# Patient Record
Sex: Female | Born: 1960 | Race: Black or African American | Hispanic: No | Marital: Married | Smoking: Former smoker
Health system: Southern US, Community
[De-identification: ages and names within clinical notes are randomized; demographics above are authoritative.]

## PROBLEM LIST (undated history)

## (undated) DIAGNOSIS — K760 Fatty (change of) liver, not elsewhere classified: Secondary | ICD-10-CM

## (undated) DIAGNOSIS — R7303 Prediabetes: Secondary | ICD-10-CM

## (undated) DIAGNOSIS — I10 Essential (primary) hypertension: Secondary | ICD-10-CM

## (undated) DIAGNOSIS — K219 Gastro-esophageal reflux disease without esophagitis: Secondary | ICD-10-CM

## (undated) HISTORY — PX: UTERINE FIBROID SURGERY: SHX826

## (undated) HISTORY — DX: Prediabetes: R73.03

## (undated) HISTORY — PX: UMBILICAL HERNIA REPAIR: SHX196

## (undated) HISTORY — PX: EYE SURGERY: SHX253

---

## 1998-10-23 ENCOUNTER — Other Ambulatory Visit: Admission: RE | Admit: 1998-10-23 | Discharge: 1998-10-23 | Payer: Self-pay | Admitting: Obstetrics

## 1998-11-08 ENCOUNTER — Emergency Department (HOSPITAL_COMMUNITY): Admission: EM | Admit: 1998-11-08 | Discharge: 1998-11-08 | Payer: Self-pay | Admitting: Emergency Medicine

## 1999-05-01 ENCOUNTER — Emergency Department (HOSPITAL_COMMUNITY): Admission: EM | Admit: 1999-05-01 | Discharge: 1999-05-01 | Payer: Self-pay | Admitting: Emergency Medicine

## 1999-05-09 ENCOUNTER — Other Ambulatory Visit: Admission: RE | Admit: 1999-05-09 | Discharge: 1999-05-09 | Payer: Self-pay | Admitting: Obstetrics

## 2000-02-18 ENCOUNTER — Emergency Department (HOSPITAL_COMMUNITY): Admission: EM | Admit: 2000-02-18 | Discharge: 2000-02-19 | Payer: Self-pay | Admitting: Emergency Medicine

## 2000-05-24 ENCOUNTER — Emergency Department (HOSPITAL_COMMUNITY): Admission: EM | Admit: 2000-05-24 | Discharge: 2000-05-24 | Payer: Self-pay | Admitting: Emergency Medicine

## 2000-12-14 ENCOUNTER — Emergency Department (HOSPITAL_COMMUNITY): Admission: EM | Admit: 2000-12-14 | Discharge: 2000-12-14 | Payer: Self-pay | Admitting: Emergency Medicine

## 2001-01-20 ENCOUNTER — Other Ambulatory Visit: Admission: RE | Admit: 2001-01-20 | Discharge: 2001-01-20 | Payer: Self-pay | Admitting: Obstetrics

## 2001-02-02 ENCOUNTER — Encounter: Payer: Self-pay | Admitting: Obstetrics

## 2001-02-02 ENCOUNTER — Ambulatory Visit (HOSPITAL_COMMUNITY): Admission: RE | Admit: 2001-02-02 | Discharge: 2001-02-02 | Payer: Self-pay | Admitting: Obstetrics

## 2001-10-10 ENCOUNTER — Emergency Department (HOSPITAL_COMMUNITY): Admission: EM | Admit: 2001-10-10 | Discharge: 2001-10-10 | Payer: Self-pay | Admitting: Emergency Medicine

## 2002-02-23 ENCOUNTER — Encounter: Payer: Self-pay | Admitting: Obstetrics

## 2002-02-23 ENCOUNTER — Ambulatory Visit (HOSPITAL_COMMUNITY): Admission: RE | Admit: 2002-02-23 | Discharge: 2002-02-23 | Payer: Self-pay | Admitting: Obstetrics

## 2002-06-14 ENCOUNTER — Other Ambulatory Visit: Admission: RE | Admit: 2002-06-14 | Discharge: 2002-06-14 | Payer: Self-pay | Admitting: Family Medicine

## 2002-06-22 ENCOUNTER — Emergency Department (HOSPITAL_COMMUNITY): Admission: EM | Admit: 2002-06-22 | Discharge: 2002-06-22 | Payer: Self-pay | Admitting: Emergency Medicine

## 2002-11-09 ENCOUNTER — Emergency Department (HOSPITAL_COMMUNITY): Admission: EM | Admit: 2002-11-09 | Discharge: 2002-11-09 | Payer: Self-pay | Admitting: Emergency Medicine

## 2003-09-06 ENCOUNTER — Emergency Department (HOSPITAL_COMMUNITY): Admission: EM | Admit: 2003-09-06 | Discharge: 2003-09-06 | Payer: Self-pay | Admitting: *Deleted

## 2004-02-02 ENCOUNTER — Emergency Department (HOSPITAL_COMMUNITY): Admission: EM | Admit: 2004-02-02 | Discharge: 2004-02-03 | Payer: Self-pay | Admitting: *Deleted

## 2004-05-28 ENCOUNTER — Emergency Department (HOSPITAL_COMMUNITY): Admission: EM | Admit: 2004-05-28 | Discharge: 2004-05-28 | Payer: Self-pay | Admitting: Emergency Medicine

## 2005-01-22 ENCOUNTER — Emergency Department (HOSPITAL_COMMUNITY): Admission: EM | Admit: 2005-01-22 | Discharge: 2005-01-22 | Payer: Self-pay | Admitting: Emergency Medicine

## 2005-11-29 ENCOUNTER — Emergency Department (HOSPITAL_COMMUNITY): Admission: EM | Admit: 2005-11-29 | Discharge: 2005-11-29 | Payer: Self-pay | Admitting: Emergency Medicine

## 2007-02-13 ENCOUNTER — Emergency Department (HOSPITAL_COMMUNITY): Admission: EM | Admit: 2007-02-13 | Discharge: 2007-02-13 | Payer: Self-pay | Admitting: *Deleted

## 2007-09-21 ENCOUNTER — Emergency Department (HOSPITAL_COMMUNITY): Admission: EM | Admit: 2007-09-21 | Discharge: 2007-09-21 | Payer: Self-pay | Admitting: Emergency Medicine

## 2008-02-05 ENCOUNTER — Emergency Department (HOSPITAL_COMMUNITY): Admission: EM | Admit: 2008-02-05 | Discharge: 2008-02-05 | Payer: Self-pay | Admitting: Emergency Medicine

## 2008-11-07 ENCOUNTER — Ambulatory Visit (HOSPITAL_COMMUNITY): Admission: RE | Admit: 2008-11-07 | Discharge: 2008-11-07 | Payer: Self-pay | Admitting: Obstetrics

## 2008-11-11 ENCOUNTER — Encounter: Admission: RE | Admit: 2008-11-11 | Discharge: 2008-11-11 | Payer: Self-pay | Admitting: Obstetrics

## 2008-11-17 ENCOUNTER — Ambulatory Visit (HOSPITAL_COMMUNITY): Admission: RE | Admit: 2008-11-17 | Discharge: 2008-11-17 | Payer: Self-pay | Admitting: Obstetrics

## 2008-11-21 ENCOUNTER — Emergency Department (HOSPITAL_COMMUNITY): Admission: EM | Admit: 2008-11-21 | Discharge: 2008-11-22 | Payer: Self-pay | Admitting: Emergency Medicine

## 2008-12-07 ENCOUNTER — Encounter: Admission: RE | Admit: 2008-12-07 | Discharge: 2008-12-07 | Payer: Self-pay | Admitting: Obstetrics

## 2008-12-11 ENCOUNTER — Encounter: Admission: RE | Admit: 2008-12-11 | Discharge: 2008-12-11 | Payer: Self-pay | Admitting: Obstetrics

## 2009-02-09 ENCOUNTER — Emergency Department (HOSPITAL_COMMUNITY): Admission: EM | Admit: 2009-02-09 | Discharge: 2009-02-10 | Payer: Self-pay | Admitting: Emergency Medicine

## 2009-03-02 ENCOUNTER — Ambulatory Visit (HOSPITAL_COMMUNITY): Admission: RE | Admit: 2009-03-02 | Discharge: 2009-03-02 | Payer: Self-pay | Admitting: Diagnostic Radiology

## 2009-03-28 ENCOUNTER — Ambulatory Visit (HOSPITAL_COMMUNITY): Admission: RE | Admit: 2009-03-28 | Discharge: 2009-03-29 | Payer: Self-pay | Admitting: Diagnostic Radiology

## 2009-04-11 ENCOUNTER — Encounter: Admission: RE | Admit: 2009-04-11 | Discharge: 2009-04-11 | Payer: Self-pay | Admitting: Interventional Radiology

## 2009-07-03 ENCOUNTER — Encounter (INDEPENDENT_AMBULATORY_CARE_PROVIDER_SITE_OTHER): Payer: Self-pay | Admitting: Emergency Medicine

## 2009-07-03 ENCOUNTER — Ambulatory Visit: Payer: Self-pay | Admitting: Vascular Surgery

## 2009-07-03 ENCOUNTER — Ambulatory Visit (HOSPITAL_COMMUNITY): Admission: RE | Admit: 2009-07-03 | Discharge: 2009-07-03 | Payer: Self-pay | Admitting: Emergency Medicine

## 2009-07-03 ENCOUNTER — Emergency Department (HOSPITAL_COMMUNITY): Admission: EM | Admit: 2009-07-03 | Discharge: 2009-07-03 | Payer: Self-pay | Admitting: Emergency Medicine

## 2009-08-29 ENCOUNTER — Emergency Department (HOSPITAL_COMMUNITY): Admission: EM | Admit: 2009-08-29 | Discharge: 2009-08-30 | Payer: Self-pay | Admitting: Emergency Medicine

## 2009-12-04 ENCOUNTER — Ambulatory Visit: Payer: Self-pay | Admitting: Surgery

## 2010-03-07 ENCOUNTER — Emergency Department (HOSPITAL_COMMUNITY): Admission: EM | Admit: 2010-03-07 | Discharge: 2010-03-07 | Payer: Self-pay | Admitting: Emergency Medicine

## 2010-10-06 ENCOUNTER — Encounter: Payer: Self-pay | Admitting: Internal Medicine

## 2010-10-07 ENCOUNTER — Encounter: Payer: Self-pay | Admitting: Obstetrics

## 2010-10-26 ENCOUNTER — Other Ambulatory Visit: Payer: Self-pay | Admitting: Obstetrics

## 2010-10-26 DIAGNOSIS — Z1231 Encounter for screening mammogram for malignant neoplasm of breast: Secondary | ICD-10-CM

## 2010-10-30 ENCOUNTER — Ambulatory Visit: Payer: Self-pay

## 2010-12-02 LAB — POCT CARDIAC MARKERS
CKMB, poc: 1.1 ng/mL (ref 1.0–8.0)
Myoglobin, poc: 66.3 ng/mL (ref 12–200)

## 2010-12-02 LAB — COMPREHENSIVE METABOLIC PANEL
AST: 30 U/L (ref 0–37)
Albumin: 3.7 g/dL (ref 3.5–5.2)
Alkaline Phosphatase: 71 U/L (ref 39–117)
CO2: 26 mEq/L (ref 19–32)
Calcium: 9.2 mg/dL (ref 8.4–10.5)
Creatinine, Ser: 0.68 mg/dL (ref 0.4–1.2)
GFR calc non Af Amer: >60 mL/min (ref >60)
Potassium: 3.6 mEq/L (ref 3.5–5.1)
Sodium: 136 mEq/L (ref 135–145)
Total Bilirubin: 0.5 mg/dL (ref 0.3–1.2)
Total Protein: 7.3 g/dL (ref 6.0–8.3)

## 2010-12-02 LAB — URINALYSIS, ROUTINE W REFLEX MICROSCOPIC
Ketones, ur: NEGATIVE mg/dL
Protein, ur: NEGATIVE mg/dL
pH: 7 (ref 5.0–8.0)

## 2010-12-02 LAB — DIFFERENTIAL
Basophils Absolute: 0 10*3/uL (ref 0.0–0.1)
Basophils Relative: 0 % (ref 0–1)
Lymphs Abs: 2.6 10*3/uL (ref 0.7–4.0)

## 2010-12-02 LAB — URINE CULTURE

## 2010-12-02 LAB — CBC
Hemoglobin: 12.4 g/dL (ref 12.0–15.0)
WBC: 9.6 10*3/uL (ref 4.0–10.5)

## 2010-12-07 ENCOUNTER — Emergency Department (HOSPITAL_COMMUNITY)
Admission: EM | Admit: 2010-12-07 | Discharge: 2010-12-08 | Discharge status: Against Medical Advice | Payer: BC Managed Care – PPO | Attending: Emergency Medicine | Admitting: Emergency Medicine

## 2010-12-08 ENCOUNTER — Other Ambulatory Visit (HOSPITAL_COMMUNITY): Payer: Self-pay | Admitting: Emergency Medicine

## 2010-12-08 ENCOUNTER — Other Ambulatory Visit: Payer: Self-pay | Admitting: Emergency Medicine

## 2010-12-08 ENCOUNTER — Emergency Department (HOSPITAL_COMMUNITY)
Admission: EM | Admit: 2010-12-08 | Discharge: 2010-12-08 | Disposition: A | Discharge status: Home / Self Care | Payer: BC Managed Care – PPO | Attending: Emergency Medicine | Admitting: Emergency Medicine

## 2010-12-08 ENCOUNTER — Ambulatory Visit (HOSPITAL_COMMUNITY)
Admission: RE | Admit: 2010-12-08 | Discharge: 2010-12-08 | Disposition: A | Discharge status: Home / Self Care | Payer: BC Managed Care – PPO | Source: Ambulatory Visit | Attending: Emergency Medicine | Admitting: Emergency Medicine

## 2010-12-08 DIAGNOSIS — R51 Headache: Secondary | ICD-10-CM | POA: Insufficient documentation

## 2010-12-08 DIAGNOSIS — R200 Anesthesia of skin: Secondary | ICD-10-CM

## 2010-12-08 DIAGNOSIS — R202 Paresthesia of skin: Secondary | ICD-10-CM

## 2010-12-08 DIAGNOSIS — I10 Essential (primary) hypertension: Secondary | ICD-10-CM | POA: Insufficient documentation

## 2010-12-08 DIAGNOSIS — R209 Unspecified disturbances of skin sensation: Secondary | ICD-10-CM | POA: Insufficient documentation

## 2010-12-13 LAB — BASIC METABOLIC PANEL
BUN: 13 mg/dL (ref 6–23)
CO2: 28 mEq/L (ref 19–32)
Chloride: 104 mEq/L (ref 96–112)
GFR calc non Af Amer: >60 mL/min (ref >60)
Glucose, Bld: 113 mg/dL — ABNORMAL HIGH (ref 70–99)
Potassium: 3.5 mEq/L (ref 3.5–5.1)
Sodium: 137 mEq/L (ref 135–145)

## 2010-12-17 ENCOUNTER — Other Ambulatory Visit: Payer: Self-pay | Admitting: Obstetrics

## 2010-12-23 LAB — CBC
HCT: 35.5 % — ABNORMAL LOW (ref 36.0–46.0)
Hemoglobin: 11.9 g/dL — ABNORMAL LOW (ref 12.0–15.0)
MCHC: 33.5 g/dL (ref 30.0–36.0)
MCV: 90.3 fL (ref 78.0–100.0)
Platelets: 282 10*3/uL (ref 150–400)
RDW: 14.7 % (ref 11.5–15.5)

## 2010-12-23 LAB — CREATININE, SERUM: GFR calc non Af Amer: >60 mL/min (ref >60)

## 2010-12-24 LAB — CBC
HCT: 38.2 % (ref 36.0–46.0)
Hemoglobin: 12.7 g/dL (ref 12.0–15.0)
MCHC: 33.3 g/dL (ref 30.0–36.0)
RBC: 4.28 MIL/uL (ref 3.87–5.11)
RDW: 14.4 % (ref 11.5–15.5)

## 2010-12-24 LAB — URINE MICROSCOPIC-ADD ON

## 2010-12-24 LAB — URINALYSIS, ROUTINE W REFLEX MICROSCOPIC
Bilirubin Urine: NEGATIVE
Hgb urine dipstick: NEGATIVE
Ketones, ur: NEGATIVE mg/dL
Nitrite: NEGATIVE
pH: 5.5 (ref 5.0–8.0)

## 2010-12-24 LAB — CREATININE, SERUM
Creatinine, Ser: 0.68 mg/dL (ref 0.4–1.2)
GFR calc non Af Amer: >60 mL/min (ref >60)

## 2010-12-24 LAB — HCG, SERUM, QUALITATIVE: Preg, Serum: NEGATIVE

## 2010-12-24 LAB — URINE CULTURE: Colony Count: >=100000

## 2010-12-27 LAB — CBC
HCT: 29.8 % — ABNORMAL LOW (ref 36.0–46.0)
MCHC: 31 g/dL (ref 30.0–36.0)
MCV: 72.7 fL — ABNORMAL LOW (ref 78.0–100.0)
Platelets: 380 10*3/uL (ref 150–400)
RDW: 29.6 % — ABNORMAL HIGH (ref 11.5–15.5)

## 2010-12-27 LAB — DIFFERENTIAL
Basophils Relative: 0 % (ref 0–1)
Eosinophils Absolute: 0.2 10*3/uL (ref 0.0–0.7)
Eosinophils Relative: 2 % (ref 0–5)
Lymphs Abs: 2.8 10*3/uL (ref 0.7–4.0)
Monocytes Absolute: 0.8 10*3/uL (ref 0.1–1.0)
Neutro Abs: 5.4 10*3/uL (ref 1.7–7.7)

## 2010-12-27 LAB — POCT I-STAT, CHEM 8
Calcium, Ion: 1.19 mmol/L (ref 1.12–1.32)
Creatinine, Ser: 0.8 mg/dL (ref 0.4–1.2)
Glucose, Bld: 106 mg/dL — ABNORMAL HIGH (ref 70–99)
HCT: 33 % — ABNORMAL LOW (ref 36.0–46.0)
Hemoglobin: 11.2 g/dL — ABNORMAL LOW (ref 12.0–15.0)

## 2011-01-02 ENCOUNTER — Other Ambulatory Visit: Payer: Self-pay | Admitting: Obstetrics

## 2011-01-02 DIAGNOSIS — N6315 Unspecified lump in the right breast, overlapping quadrants: Secondary | ICD-10-CM

## 2011-01-04 ENCOUNTER — Ambulatory Visit
Admission: RE | Admit: 2011-01-04 | Discharge: 2011-01-04 | Disposition: A | Discharge status: Home / Self Care | Payer: BC Managed Care – PPO | Source: Ambulatory Visit | Attending: Obstetrics | Admitting: Obstetrics

## 2011-01-04 ENCOUNTER — Other Ambulatory Visit: Payer: Self-pay | Admitting: Obstetrics

## 2011-01-04 DIAGNOSIS — N6315 Unspecified lump in the right breast, overlapping quadrants: Secondary | ICD-10-CM

## 2011-01-29 NOTE — Assessment & Plan Note (Signed)
OFFICE VISIT   Marie Tate, Marie Tate  DOB:  1961-03-19                                       12/04/2009  NUUVO#:[id number]   REASON FOR VISIT:  Left leg pain.   HISTORY:  This is a 50 year old female that I am seeing for evaluation  of left leg pain.  The patient states that her pain developed last year  following a uterine artery embolization.  She states that it used to  bother her and keep her awake every night.  The severity has decreased  over the last several months.  She says that she describes it as a  severe cramping.  She has taken up eating bananas for potassium  supplementation, and she started an exercise program now, going 35 to 45  minutes on an exercise bike.  Overall, she states it is somewhat better.   The patient's uterine artery embolization was done through a right groin  access.  There were no complications.  The patient has a history of  undergoing open fibroid removal as well as umbilical hernia repair.   PAST MEDICAL HISTORY:  Hypertension.   FAMILY HISTORY:  Negative for cardiovascular at an early age.   SOCIAL HISTORY:  She has 2 children.  Does not smoke, quit in 2009.  Drinks alcohol twice a month.   REVIEW OF SYSTEMS:  All review of systems are negative except as  documented in the HPI.   ALLERGIES:  None.   MEDICATIONS:  Hydrochlorothiazide, vitamin D, and multivitamin.   PHYSICAL EXAMINATION:  Heart rate 75, blood pressure 148/85, temperature  is 97.9.  General:  She is well-appearing in no distress.  HEENT:  Within normal limits.  Respirations nonlabored.  Cardiovascular:  Regular rate and rhythm.  Extremities:  She has palpable posterior  tibial pulses.  Neuro:  She has no focal weaknesses or sensory deficits.  Her calves are soft bilaterally.  The area is not tender to palpation.  There is no edema.   DIAGNOSTIC STUDIES:  Ankle brachial indices performed today which were  greater than 1 bilaterally with triphasic  signals.   ASSESSMENT:  Left leg pain.   PLAN:  The etiology of the patient's complaints are unclear to me at  this time.  What I have reassured her is that it is not due to arterial  compromise.  She has normal ankle brachial indices bilaterally and  palpable posterior tibial pulses.  She has also undergone ultrasound in  the recent past during the height of her complaints, and this was  negative for deep or superficial vein thrombosis.  I suspect that this  may be somewhat of a neurapraxia.  It is improving, and I told her I  would continue to monitor this to see if it resolves completely.  If she  deteriorates or wants to probe deeper into the etiology of this, I think  the next step would be an MRI; however, I am not recommending doing this  at this time.     Jorge Ny, MD  Electronically Signed   VWB/MEDQ  D:  12/04/2009  T:  12/05/2009  Job:  [id number]   cc:   Kathreen Cosier, M.D.

## 2011-06-12 LAB — GC/CHLAMYDIA PROBE AMP, GENITAL: Chlamydia, DNA Probe: NEGATIVE

## 2011-10-27 ENCOUNTER — Emergency Department (HOSPITAL_COMMUNITY)
Admission: EM | Admit: 2011-10-27 | Discharge: 2011-10-27 | Disposition: A | Discharge status: Home / Self Care | Payer: BC Managed Care – PPO | Attending: Emergency Medicine | Admitting: Emergency Medicine

## 2011-10-27 ENCOUNTER — Other Ambulatory Visit: Payer: Self-pay

## 2011-10-27 ENCOUNTER — Encounter (HOSPITAL_COMMUNITY): Payer: Self-pay | Admitting: Adult Health

## 2011-10-27 DIAGNOSIS — E669 Obesity, unspecified: Secondary | ICD-10-CM | POA: Insufficient documentation

## 2011-10-27 DIAGNOSIS — R0789 Other chest pain: Secondary | ICD-10-CM

## 2011-10-27 DIAGNOSIS — I10 Essential (primary) hypertension: Secondary | ICD-10-CM | POA: Insufficient documentation

## 2011-10-27 HISTORY — DX: Essential (primary) hypertension: I10

## 2011-10-27 LAB — BASIC METABOLIC PANEL
BUN: 16 mg/dL (ref 6–23)
Creatinine, Ser: 0.78 mg/dL (ref 0.50–1.10)
GFR calc Af Amer: >90 mL/min (ref >90)
GFR calc non Af Amer: >90 mL/min (ref >90)
Potassium: 3.8 mEq/L (ref 3.5–5.1)

## 2011-10-27 LAB — CBC
HCT: 34.2 % — ABNORMAL LOW (ref 36.0–46.0)
MCHC: 34.2 g/dL (ref 30.0–36.0)
Platelets: 312 10*3/uL (ref 150–400)
RDW: 14.1 % (ref 11.5–15.5)

## 2011-10-27 LAB — POCT I-STAT TROPONIN I
Troponin i, poc: 0 ng/mL (ref 0.00–0.08)
Troponin i, poc: 0 ng/mL (ref 0.00–0.08)

## 2011-10-27 MED ORDER — GI COCKTAIL ~~LOC~~
30.0000 mL | Freq: Once | ORAL | Status: AC
  Administered 2011-10-27: 30 mL via ORAL
  Filled 2011-10-27: qty 30

## 2011-10-27 MED ORDER — ASPIRIN 325 MG PO TABS: 325.0000 mg | ORAL_TABLET | ORAL | Status: DC

## 2011-10-27 MED ORDER — NITROGLYCERIN 0.4 MG SL SUBL
0.4000 mg | SUBLINGUAL_TABLET | SUBLINGUAL | Status: DC | PRN
  Administered 2011-10-27 (×2): 0.4 mg via SUBLINGUAL
  Filled 2011-10-27: qty 25

## 2011-10-27 NOTE — ED Notes (Signed)
Patient given diet ginger ale per her request. Pt readying for discharge

## 2011-10-27 NOTE — ED Provider Notes (Signed)
History     CSN: [id number]  Arrival date & time 10/27/11  0865   First MD Initiated Contact with Patient 10/27/11 [phone]      Chief Complaint  Patient presents with  . Chest Pain    (Consider location/radiation/quality/duration/timing/severity/associated sxs/prior treatment) HPI Complains of anterior chest pain nonradiating described as a tightness and indigestion originates at epigastrium radiates to the substernal area onset yesterday morning after eating lasted one hour improved after treating herself with TUMS and Pepto-Bismol. Pain is worse with lying supine improved with sitting up and improved with walking. Had repeat episode last night 10 PM shortly after eating dinner, and again this morning. This morning she felt as if she were having a panic attack. Patient treated with 2 sublingual nitroglycerin prior to my examining her which took pain from an 8 to a 7. No other associated symptoms. Cardiac risk factors hypertension, remote family hx; otherwise negative Past Medical History  Diagnosis Date  . Hypertension     History reviewed. No pertinent past surgical history. Surgical history myomectomy History reviewed. No pertinent family history.  History  Substance Use Topics  . Smoking status: Never Smoker   . Smokeless tobacco: Not on file  . Alcohol Use: No   family history: Mother with mi age 29  OB History    Grav Para Term Preterm Abortions TAB SAB Ect Mult Living                  Review of Systems  Respiratory: Positive for chest tightness.   Cardiovascular: Positive for chest pain.  All other systems reviewed and are negative.    Allergies  Review of patient's allergies indicates no known allergies.  Home Medications  No current outpatient prescriptions on file.  BP 152/87  Pulse 91  Temp 98 F (36.7 C)  Resp 16  Wt 200 lb (90.719 kg)  SpO2 99%  Physical Exam  Constitutional: She appears well-developed and well-nourished.  HENT:  Head:  Normocephalic and atraumatic.  Eyes: Conjunctivae are normal. Pupils are equal, round, and reactive to light.  Neck: Neck supple. No tracheal deviation present. No thyromegaly present.  Cardiovascular: Normal rate and regular rhythm.   No murmur heard. Pulmonary/Chest: Effort normal and breath sounds normal.  Abdominal: Soft. Bowel sounds are normal. She exhibits no distension. There is no tenderness.       Obese  Musculoskeletal: Normal range of motion. She exhibits no edema and no tenderness.  Neurological: She is alert. Coordination normal.  Skin: Skin is warm and dry. No rash noted.  Psychiatric: She has a normal mood and affect.    Date: 10/27/2011  Rate: 85  Rhythm: normal sinus rhythm  QRS Axis: normal  Intervals: normal  ST/T Wave abnormalities: normal and nonspecific T wave changes  Conduction Disutrbances:none  Narrative Interpretation:   Old EKG Reviewed: unchanged Unchanged from 10/07/2009  ED Course  Procedures (including critical care time) 9:25 a.m. feels improved after treatment with GI cocktail 12:45 PM resting comfortably Spoke with Dr.Nishan, who will arrange for outpatient followup and outpatient cardiac workup  Labs Reviewed  CBC  BASIC METABOLIC PANEL   No results found.   No diagnosis found.    MDM  Strongly doubt acute coronary syndrome given atypical symptoms normal EKG 2 sets of normal markers only risk factor being hypertension in this menstruating female Diagnoses atypical chest pain        Doug Sou, MD 10/27/11 1255

## 2011-10-27 NOTE — ED Notes (Signed)
Chest pain and tightness described as burning and indigestion that started yesterday, no relief with tums, pain radiates from abdomen into sternum, associated with SOB.

## 2011-10-27 NOTE — ED Notes (Signed)
Pt reports history of GI upset and acid reflux. Pt reports she was suppose to see GI specialist last year for similar issues, but her insurance did not cover and she was unable to go. Pt reports she currently feels better and has no chest discomfort, but feels bloated. Lab at bedside to draw blood

## 2011-10-27 NOTE — ED Notes (Signed)
MD (Dr. Rennis Chris) at bedside, instructed this nurse to stop SL NTG and to remove O2 via nasal cannula which was done, is planning to order GI Cocktail, will monitor.

## 2011-10-27 NOTE — ED Notes (Signed)
MD in briefly to see pt.

## 2011-10-28 ENCOUNTER — Telehealth: Payer: Self-pay | Admitting: Cardiovascular Disease

## 2011-10-28 NOTE — Telephone Encounter (Signed)
Fu call °Patient returning your call °

## 2011-10-28 NOTE — Telephone Encounter (Signed)
PT AWARE NEEDS GXT PER DR NISHAN. GXT  SCHEDULED WITH  SCOTT WEAVER [facility]  FOR 11-06-10 AT 11:30 .Marie Tate

## 2011-10-28 NOTE — Progress Notes (Signed)
LMTCB ./CY 

## 2011-11-07 ENCOUNTER — Encounter: Payer: BC Managed Care – PPO | Admitting: Physician Assistant

## 2012-01-20 ENCOUNTER — Other Ambulatory Visit: Payer: Self-pay | Admitting: Obstetrics

## 2012-01-20 DIAGNOSIS — Z1231 Encounter for screening mammogram for malignant neoplasm of breast: Secondary | ICD-10-CM

## 2012-02-05 ENCOUNTER — Emergency Department (HOSPITAL_COMMUNITY)
Admission: EM | Admit: 2012-02-05 | Discharge: 2012-02-06 | Disposition: A | Discharge status: Home / Self Care | Payer: BC Managed Care – PPO | Attending: Emergency Medicine | Admitting: Emergency Medicine

## 2012-02-05 ENCOUNTER — Ambulatory Visit: Payer: BC Managed Care – PPO

## 2012-02-05 ENCOUNTER — Emergency Department (HOSPITAL_COMMUNITY): Payer: BC Managed Care – PPO

## 2012-02-05 ENCOUNTER — Encounter (HOSPITAL_COMMUNITY): Payer: Self-pay | Admitting: Emergency Medicine

## 2012-02-05 DIAGNOSIS — M549 Dorsalgia, unspecified: Secondary | ICD-10-CM | POA: Insufficient documentation

## 2012-02-05 DIAGNOSIS — M542 Cervicalgia: Secondary | ICD-10-CM | POA: Insufficient documentation

## 2012-02-05 DIAGNOSIS — R079 Chest pain, unspecified: Secondary | ICD-10-CM | POA: Insufficient documentation

## 2012-02-05 LAB — CBC
HCT: 38.5 % (ref 36.0–46.0)
Hemoglobin: 12.9 g/dL (ref 12.0–15.0)
MCH: 29 pg (ref 26.0–34.0)
MCHC: 33.5 g/dL (ref 30.0–36.0)

## 2012-02-05 LAB — COMPREHENSIVE METABOLIC PANEL
BUN: 22 mg/dL (ref 6–23)
CO2: 25 mEq/L (ref 19–32)
Calcium: 9.2 mg/dL (ref 8.4–10.5)
Creatinine, Ser: 0.76 mg/dL (ref 0.50–1.10)
GFR calc Af Amer: >90 mL/min (ref >90)
GFR calc non Af Amer: >90 mL/min (ref >90)
Glucose, Bld: 102 mg/dL — ABNORMAL HIGH (ref 70–99)

## 2012-02-05 LAB — POCT I-STAT TROPONIN I

## 2012-02-05 MED ORDER — KETOROLAC TROMETHAMINE 30 MG/ML IJ SOLN
30.0000 mg | Freq: Once | INTRAMUSCULAR | Status: AC
  Administered 2012-02-06: 30 mg via INTRAVENOUS
  Filled 2012-02-05: qty 1

## 2012-02-05 NOTE — ED Notes (Signed)
Patient transported to X-ray 

## 2012-02-05 NOTE — ED Notes (Signed)
Pt presents tonight with c/o chest pain that started about 1400 today  Pt states she took 2 aspirin and laid down and the pain went away   Pt states about 1600 the pain returned so she took 2 more aspirin  Pt states she also has pain in her back  Pt states her whole back is aching  Pt states she has had dizziness today

## 2012-02-06 ENCOUNTER — Emergency Department (HOSPITAL_COMMUNITY): Payer: BC Managed Care – PPO

## 2012-02-06 LAB — DIFFERENTIAL
Basophils Relative: 0 % (ref 0–1)
Eosinophils Absolute: 0.1 10*3/uL (ref 0.0–0.7)
Monocytes Absolute: 0.8 10*3/uL (ref 0.1–1.0)
Neutro Abs: 8.6 10*3/uL — ABNORMAL HIGH (ref 1.7–7.7)

## 2012-02-06 MED ORDER — IBUPROFEN 600 MG PO TABS: 600.0000 mg | ORAL_TABLET | Freq: Four times a day (QID) | ORAL | Status: AC | PRN

## 2012-02-06 MED ORDER — IOHEXOL 300 MG/ML  SOLN
100.0000 mL | Freq: Once | INTRAMUSCULAR | Status: AC | PRN
  Administered 2012-02-06: 100 mL via INTRAVENOUS

## 2012-02-06 NOTE — Discharge Instructions (Signed)
Chest Pain (Nonspecific) It is often hard to give a specific diagnosis for the cause of chest pain. There is always a chance that your pain could be related to something serious, such as a heart attack or a blood clot in the lungs. You need to follow up with your caregiver for further evaluation. CAUSES   Heartburn.   Pneumonia or bronchitis.   Anxiety or stress.   Inflammation around your heart (pericarditis) or lung (pleuritis or pleurisy).   A blood clot in the lung.   A collapsed lung (pneumothorax). It can develop suddenly on its own (spontaneous pneumothorax) or from injury (trauma) to the chest.   Shingles infection (herpes zoster virus).  The chest wall is composed of bones, muscles, and cartilage. Any of these can be the source of the pain.  The bones can be bruised by injury.   The muscles or cartilage can be strained by coughing or overwork.   The cartilage can be affected by inflammation and become sore (costochondritis).  DIAGNOSIS  Lab tests or other studies, such as X-rays, electrocardiography, stress testing, or cardiac imaging, may be needed to find the cause of your pain.  TREATMENT   Treatment depends on what may be causing your chest pain. Treatment may include:   Acid blockers for heartburn.   Anti-inflammatory medicine.   Pain medicine for inflammatory conditions.   Antibiotics if an infection is present.   You may be advised to change lifestyle habits. This includes stopping smoking and avoiding alcohol, caffeine, and chocolate.   You may be advised to keep your head raised (elevated) when sleeping. This reduces the chance of acid going backward from your stomach into your esophagus.   Most of the time, nonspecific chest pain will improve within 2 to 3 days with rest and mild pain medicine.  HOME CARE INSTRUCTIONS   If antibiotics were prescribed, take your antibiotics as directed. Finish them even if you start to feel better.   For the next few  days, avoid physical activities that bring on chest pain. Continue physical activities as directed.   Do not smoke.   Avoid drinking alcohol.   Only take over-the-counter or prescription medicine for pain, discomfort, or fever as directed by your caregiver.   Follow your caregiver's suggestions for further testing if your chest pain does not go away.   Keep any follow-up appointments you made. If you do not go to an appointment, you could develop lasting (chronic) problems with pain. If there is any problem keeping an appointment, you must call to reschedule.  SEEK MEDICAL CARE IF:   You think you are having problems from the medicine you are taking. Read your medicine instructions carefully.   Your chest pain does not go away, even after treatment.   You develop a rash with blisters on your chest.  SEEK IMMEDIATE MEDICAL CARE IF:   You have increased chest pain or pain that spreads to your arm, neck, jaw, back, or abdomen.   You develop shortness of breath, an increasing cough, or you are coughing up blood.   You have severe back or abdominal pain, feel nauseous, or vomit.   You develop severe weakness, fainting, or chills.   You have a fever.  THIS IS AN EMERGENCY. Do not wait to see if the pain will go away. Get medical help at once. Call your local emergency services (911 in [place] Do not drive yourself to the hospital. MAKE SURE YOU:   Understand these instructions.     Will watch your condition.   Will get help right away if you are not doing well or get worse.   

## 2012-02-06 NOTE — ED Provider Notes (Signed)
History     CSN: [id number]  Arrival date & time 02/05/12  2056   First MD Initiated Contact with Patient 02/05/12 2300      Chief Complaint  Patient presents with  . Chest Pain    (Consider location/radiation/quality/duration/timing/severity/associated sxs/prior treatment) HPI History provided by patient. Has been having neck pain for the last few days, felt like she slept on it wrong. She has since developed left-sided back discomfort worse with movement and now associated left-sided chest discomfort. Just began bothering her around 2 PM today, it is also worse with movement. Aching in quality and chest pain is not radiating. No cough or shortness of breath. No leg pain or swelling. No history of DVT or PE. No fevers. Moderate in severity. Has history of hypertension but no known heart problems otherwise. Past Medical History  Diagnosis Date  . Hypertension     Past Surgical History  Procedure Date  . Uterine fibroid surgery     Family History  Problem Relation Age of Onset  . Heart attack Mother   . Stroke Father     History  Substance Use Topics  . Smoking status: Never Smoker   . Smokeless tobacco: Not on file  . Alcohol Use: Yes     occ    OB History    Grav Para Term Preterm Abortions TAB SAB Ect Mult Living                  Review of Systems  Constitutional: Negative for fever and chills.  HENT: Positive for neck pain. Negative for neck stiffness.   Eyes: Negative for pain.  Respiratory: Negative for shortness of breath.   Cardiovascular: Positive for chest pain.  Gastrointestinal: Negative for abdominal pain.  Genitourinary: Negative for dysuria.  Musculoskeletal: Positive for back pain.  Skin: Negative for rash.  Neurological: Negative for headaches.  All other systems reviewed and are negative.    Allergies  Review of patient's allergies indicates no known allergies.  Home Medications   Current Outpatient Rx  Name Route Sig Dispense  Refill  . ASPIRIN 325 MG PO TABS Oral Take 325 mg by mouth every 4 (four) hours as needed. For pain relief    . OMEGA-3 FATTY ACIDS 1000 MG PO CAPS Oral Take 2 g by mouth daily.    Marland Kitchen LISINOPRIL-HYDROCHLOROTHIAZIDE 20-12.5 MG PO TABS Oral Take 1 tablet by mouth daily.    Marland Kitchen VITAMIN D (CHOLECALCIFEROL) PO Oral Take 1 tablet by mouth daily.      BP 117/68  Pulse 80  Temp(Src) 98 F (36.7 C) (Oral)  Resp 20  SpO2 100%  LMP 01/27/2012  Physical Exam  Constitutional: She is oriented to person, place, and time. She appears well-developed and well-nourished.  HENT:  Head: Normocephalic and atraumatic.  Eyes: Conjunctivae and EOM are normal. Pupils are equal, round, and reactive to light.  Neck: Trachea normal. Neck supple. No thyromegaly present.  Cardiovascular: Normal rate, regular rhythm, S1 normal, S2 normal and normal pulses.     No systolic murmur is present   No diastolic murmur is present  Pulses:      Radial pulses are 2+ on the right side, and 2+ on the left side.  Pulmonary/Chest: Effort normal and breath sounds normal. She has no wheezes. She has no rhonchi. She has no rales.       No reproducible tenderness, crepitus or rash.  Abdominal: Soft. Normal appearance and bowel sounds are normal. There is no tenderness. There  is no CVA tenderness and negative Murphy's sign.  Musculoskeletal: Normal range of motion. She exhibits no edema and no tenderness.       Left paracervical and parathoracic muscle spasm and reproducible tenderness. No rash or crepitus.  BLE:s Calves nontender, no cords or erythema, negative Homans sign  Neurological: She is alert and oriented to person, place, and time. She has normal strength. No cranial nerve deficit or sensory deficit. GCS eye subscore is 4. GCS verbal subscore is 5. GCS motor subscore is 6.  Skin: Skin is warm and dry. No rash noted. She is not diaphoretic.  Psychiatric: Her speech is normal.       Cooperative and appropriate    ED Course   Procedures (including critical care time)  Labs Reviewed  CBC - Abnormal; Notable for the following:    WBC 12.8 (*)    All other components within normal limits  DIFFERENTIAL - Abnormal; Notable for the following:    Neutro Abs 8.6 (*)    All other components within normal limits  COMPREHENSIVE METABOLIC PANEL - Abnormal; Notable for the following:    Glucose, Bld 102 (*)    Total Bilirubin 0.2 (*)    All other components within normal limits  POCT I-STAT TROPONIN I   Dg Chest 2 View  02/05/2012  *RADIOLOGY REPORT*  Clinical Data: Chest pain.  CHEST - 2 VIEW  Comparison: Two-view chest x-ray 11/22/2008, 09/21/2007.  Findings: Cardiomediastinal silhouette unremarkable, unchanged. Lungs clear.  Bronchovascular markings normal.  Pulmonary vascularity normal.  No pneumothorax.  No pleural effusions. Slight thoracolumbar scoliosis convex right.  No significant interval change.  IMPRESSION: No acute or significant abnormalities.  Stable examination.  Original Report Authenticated By: Arnell Sieving, M.D.   Ct Angio Chest W/cm &/or Wo Cm  02/06/2012  *RADIOLOGY REPORT*  Clinical Data: Chest pain  CT ANGIOGRAPHY CHEST  Technique:  Multidetector CT imaging of the chest using the standard protocol during bolus administration of intravenous contrast. Multiplanar reconstructed images including MIPs were obtained and reviewed to evaluate the vascular anatomy.  Contrast: OMNIPAQUE IOHEXOL 300 MG/ML  SOLN  Comparison: 02/05/2012 radiograph  Findings: No pulmonary arterial branch filling defect identified. Normal heart size.  No pleural or pericardial effusion.  Normal caliber aorta with mild scattered atherosclerotic calcification. No intrathoracic lymphadenopathy.  Limited images through the upper abdomen show no acute abnormality  Central airways are patent.  Minimal dependent atelectasis.  Lungs otherwise clear.  No pneumothorax.  No acute osseous finding. Small cervical ribs.  IMPRESSION: No  pulmonary embolism or acute intrathoracic process identified.  Original Report Authenticated By: Waneta Martins, M.D.     Date: 02/06/2012  Rate: 77  Rhythm: normal sinus rhythm  QRS Axis: normal  Intervals: normal  ST/T Wave abnormalities: nonspecific ST changes  Conduction Disutrbances:none  Narrative Interpretation:   Old EKG Reviewed: unchanged  IV Toradol. Serial troponins negative. EKG reviewed as above. CT scan to evaluate given back pain.    MDM   Chest pain improved with Toradol, atypical for ACS, likely musculoskeletal given associated neck discomfort last week and reproducible musculoskeletal back pain. Plan discharge home with recommended outpatient stress test. Reliable historian verbalizes understanding chest pain precautions and followup instructions.       Sunnie Nielsen, MD 02/06/12 [phone]

## 2012-06-20 ENCOUNTER — Emergency Department (HOSPITAL_COMMUNITY)
Admission: EM | Admit: 2012-06-20 | Discharge: 2012-06-20 | Disposition: A | Discharge status: Home / Self Care | Payer: BC Managed Care – PPO | Source: Home / Self Care

## 2012-06-20 ENCOUNTER — Encounter (HOSPITAL_COMMUNITY): Payer: Self-pay | Admitting: Emergency Medicine

## 2012-06-20 DIAGNOSIS — R252 Cramp and spasm: Secondary | ICD-10-CM

## 2012-06-20 LAB — POCT I-STAT, CHEM 8
BUN: 11 mg/dL (ref 6–23)
Calcium, Ion: 1.23 mmol/L (ref 1.12–1.23)
Chloride: 103 mEq/L (ref 96–112)
Creatinine, Ser: 0.9 mg/dL (ref 0.50–1.10)
Glucose, Bld: 97 mg/dL (ref 70–99)
TCO2: 24 mmol/L (ref 0–100)

## 2012-06-20 NOTE — ED Notes (Signed)
Pt c/o cramping on feet and legs x2 days... Happens only when she is resting/sleeping... Sx include: pain at thighs and calves.Marland KitchenMarland KitchenCramps will start at bottom of feet and radiate all the way to thighs... Denies: fevers, vomiting, nausea, diarrhea.

## 2012-06-20 NOTE — ED Provider Notes (Signed)
History     CSN: [id number]  Arrival date & time 06/20/12  1244   None     Chief Complaint  Patient presents with  . Leg Pain    (Consider location/radiation/quality/duration/timing/severity/associated sxs/prior treatment) HPI Comments: This 51 year old female presents with complaints of left cramps 3 years. Recently they've been increasing in frequency over the past 5-6 weeks. Almost exclusively occurring while lying down to rest or at nighttime in bed. Cracking occurs in both lower and upper legs in the quadriceps and calf muscles. She states this is 2010 she's had 2 Doppler studies which have been normal and did not identified DVTs or poor circulation. Walking helps to relieve onset of cramps. She stretches during the evening that helps to some extent but invariably the cramps returned. She's currently taking lisinopril/HCTZ for hypertension.   Past Medical History  Diagnosis Date  . Hypertension     Past Surgical History  Procedure Date  . Uterine fibroid surgery     Family History  Problem Relation Age of Onset  . Heart attack Mother   . Stroke Father     History  Substance Use Topics  . Smoking status: Never Smoker   . Smokeless tobacco: Not on file  . Alcohol Use: Yes     occ    OB History    Grav Para Term Preterm Abortions TAB SAB Ect Mult Living                  Review of Systems  Constitutional: Negative for fever, chills and activity change.  HENT: Negative.   Respiratory: Negative.  Negative for cough and chest tightness.   Cardiovascular: Negative.  Negative for chest pain and palpitations.  Genitourinary: Negative.   Musculoskeletal:       As per HPI  Skin: Negative.  Negative for color change, pallor and rash.  Neurological: Negative.  Negative for dizziness, facial asymmetry, speech difficulty and light-headedness.    Allergies  Review of patient's allergies indicates no known allergies.  Home Medications   Current Outpatient Rx    Name Route Sig Dispense Refill  . OMEGA-3 FATTY ACIDS 1000 MG PO CAPS Oral Take 2 g by mouth daily.    Marland Kitchen LISINOPRIL-HYDROCHLOROTHIAZIDE 20-12.5 MG PO TABS Oral Take 1 tablet by mouth daily.    . ASPIRIN 325 MG PO TABS Oral Take 325 mg by mouth every 4 (four) hours as needed. For pain relief    . VITAMIN D (CHOLECALCIFEROL) PO Oral Take 1 tablet by mouth daily.      BP 149/93  Pulse 91  Temp 98.4 F (36.9 C) (Oral)  Resp 20  SpO2 99%  LMP 06/15/2012  Physical Exam  Constitutional: She is oriented to person, place, and time. She appears well-developed and well-nourished. No distress.  HENT:  Head: Normocephalic and atraumatic.  Eyes: EOM are normal. Pupils are equal, round, and reactive to light.  Neck: Normal range of motion. Neck supple.  Cardiovascular: Normal rate, regular rhythm and normal heart sounds.   Pulmonary/Chest: Effort normal and breath sounds normal. No respiratory distress.  Musculoskeletal:       Minor tenderness of the bilateral calves however there's no swelling tension discoloration or edema. Her pulses are 2+; range of motion of her ankles and feet are complete.  Lymphadenopathy:    She has no cervical adenopathy.  Neurological: She is alert and oriented to person, place, and time. No cranial nerve deficit.  Skin: Skin is warm and dry.  ED Course  Procedures (including critical care time)   Labs Reviewed  POCT I-STAT, CHEM 8   No results found.   1. Muscle cramps at night       MDM   Results for orders placed during the hospital encounter of 06/20/12  POCT I-STAT, CHEM 8      Component Value Range   Sodium 140  135 - 145 mEq/L   Potassium 4.5  3.5 - 5.1 mEq/L   Chloride 103  96 - 112 mEq/L   BUN 11  6 - 23 mg/dL   Creatinine, Ser 2.13  0.50 - 1.10 mg/dL   Glucose, Bld 97  70 - 99 mg/dL   Calcium, Ion 0.86  5.78 - 1.23 mmol/L   TCO2 24  0 - 100 mmol/L   Hemoglobin 15.0  12.0 - 15.0 g/dL   HCT 46.9  62.9 - 52.8 %   Continue stretches  of  the calf muscles and quadriceps. Do These throughout the day and right going to bed. May try tonic water I think the grocery store half-glass before bedtime, and a low milligram dose of magnesium daily. Sometimes this will help with cramps. Do not sit for long periods of time         Hayden Rasmussen, NP 06/20/12 1455

## 2012-06-20 NOTE — ED Provider Notes (Signed)
Medical screening examination/treatment/procedure(s) were performed by non-physician practitioner and as supervising physician I was immediately available for consultation/collaboration.  Raynald Blend, MD 06/20/12 [phone]

## 2012-07-09 ENCOUNTER — Ambulatory Visit: Payer: BC Managed Care – PPO

## 2012-08-19 ENCOUNTER — Ambulatory Visit: Payer: BC Managed Care – PPO

## 2012-09-03 ENCOUNTER — Ambulatory Visit: Payer: BC Managed Care – PPO

## 2013-01-21 ENCOUNTER — Ambulatory Visit: Payer: BC Managed Care – PPO

## 2013-01-26 ENCOUNTER — Ambulatory Visit
Admission: RE | Admit: 2013-01-26 | Discharge: 2013-01-26 | Disposition: A | Discharge status: Home / Self Care | Payer: BC Managed Care – PPO | Source: Ambulatory Visit | Attending: Obstetrics | Admitting: Obstetrics

## 2013-01-26 ENCOUNTER — Emergency Department (HOSPITAL_COMMUNITY)
Admission: EM | Admit: 2013-01-26 | Discharge: 2013-01-26 | Disposition: A | Discharge status: Home / Self Care | Payer: BC Managed Care – PPO | Attending: Emergency Medicine | Admitting: Emergency Medicine

## 2013-01-26 ENCOUNTER — Encounter (HOSPITAL_COMMUNITY): Payer: Self-pay | Admitting: Emergency Medicine

## 2013-01-26 DIAGNOSIS — Z87891 Personal history of nicotine dependence: Secondary | ICD-10-CM | POA: Insufficient documentation

## 2013-01-26 DIAGNOSIS — Y929 Unspecified place or not applicable: Secondary | ICD-10-CM | POA: Insufficient documentation

## 2013-01-26 DIAGNOSIS — M25561 Pain in right knee: Secondary | ICD-10-CM

## 2013-01-26 DIAGNOSIS — Z1231 Encounter for screening mammogram for malignant neoplasm of breast: Secondary | ICD-10-CM

## 2013-01-26 DIAGNOSIS — M1710 Unilateral primary osteoarthritis, unspecified knee: Secondary | ICD-10-CM | POA: Insufficient documentation

## 2013-01-26 DIAGNOSIS — M1990 Unspecified osteoarthritis, unspecified site: Secondary | ICD-10-CM

## 2013-01-26 DIAGNOSIS — IMO0002 Reserved for concepts with insufficient information to code with codable children: Secondary | ICD-10-CM | POA: Insufficient documentation

## 2013-01-26 DIAGNOSIS — R229 Localized swelling, mass and lump, unspecified: Secondary | ICD-10-CM | POA: Insufficient documentation

## 2013-01-26 DIAGNOSIS — Y9301 Activity, walking, marching and hiking: Secondary | ICD-10-CM | POA: Insufficient documentation

## 2013-01-26 DIAGNOSIS — Z79899 Other long term (current) drug therapy: Secondary | ICD-10-CM | POA: Insufficient documentation

## 2013-01-26 DIAGNOSIS — I10 Essential (primary) hypertension: Secondary | ICD-10-CM | POA: Insufficient documentation

## 2013-01-26 DIAGNOSIS — Z7982 Long term (current) use of aspirin: Secondary | ICD-10-CM | POA: Insufficient documentation

## 2013-01-26 DIAGNOSIS — T733XXA Exhaustion due to excessive exertion, initial encounter: Secondary | ICD-10-CM | POA: Insufficient documentation

## 2013-01-26 MED ORDER — NAPROXEN 500 MG PO TABS: 500.0000 mg | ORAL_TABLET | Freq: Two times a day (BID) | ORAL | Status: DC

## 2013-01-26 NOTE — Discharge Instructions (Signed)
Take naproxen twice daily as directed. Be sure to rest and apply ice to your knees. Followup with one of the resources below to establish care with a primary care physician.  Knee Pain The knee is the complex joint between your thigh and your lower leg. It is made up of bones, tendons, ligaments, and cartilage. The bones that make up the knee are:  The femur in the thigh.  The tibia and fibula in the lower leg.  The patella or kneecap riding in the groove on the lower femur. CAUSES  Knee pain is a common complaint with many causes. A few of these causes are:  Injury, such as:  A ruptured ligament or tendon injury.  Torn cartilage.  Medical conditions, such as:  Gout  Arthritis  Infections  Overuse, over training or overdoing a physical activity. Knee pain can be minor or severe. Knee pain can accompany debilitating injury. Minor knee problems often respond well to self-care measures or get well on their own. More serious injuries may need medical intervention or even surgery. SYMPTOMS The knee is complex. Symptoms of knee problems can vary widely. Some of the problems are:  Pain with movement and weight bearing.  Swelling and tenderness.  Buckling of the knee.  Inability to straighten or extend your knee.  Your knee locks and you cannot straighten it.  Warmth and redness with pain and fever.  Deformity or dislocation of the kneecap. DIAGNOSIS  Determining what is wrong may be very straight forward such as when there is an injury. It can also be challenging because of the complexity of the knee. Tests to make a diagnosis may include:  Your caregiver taking a history and doing a physical exam.  Routine X-rays can be used to rule out other problems. X-rays will not reveal a cartilage tear. Some injuries of the knee can be diagnosed by:  Arthroscopy a surgical technique by which a small video camera is inserted through tiny incisions on the sides of the knee. This  procedure is used to examine and repair internal knee joint problems. Tiny instruments can be used during arthroscopy to repair the torn knee cartilage (meniscus).  Arthrography is a radiology technique. A contrast liquid is directly injected into the knee joint. Internal structures of the knee joint then become visible on X-ray film.  An MRI scan is a non x-ray radiology procedure in which magnetic fields and a computer produce two- or three-dimensional images of the inside of the knee. Cartilage tears are often visible using an MRI scanner. MRI scans have largely replaced arthrography in diagnosing cartilage tears of the knee.  Blood work.  Examination of the fluid that helps to lubricate the knee joint (synovial fluid). This is done by taking a sample out using a needle and a syringe. TREATMENT The treatment of knee problems depends on the cause. Some of these treatments are:  Depending on the injury, proper casting, splinting, surgery or physical therapy care will be needed.  Give yourself adequate recovery time. Do not overuse your joints. If you begin to get sore during workout routines, back off. Slow down or do fewer repetitions.  For repetitive activities such as cycling or running, maintain your strength and nutrition.  Alternate muscle groups. For example if you are a weight lifter, work the upper body on one day and the lower body the next.  Either tight or weak muscles do not give the proper support for your knee. Tight or weak muscles do not absorb  the stress placed on the knee joint. Keep the muscles surrounding the knee strong.  Take care of mechanical problems.  If you have flat feet, orthotics or special shoes may help. See your caregiver if you need help.  Arch supports, sometimes with wedges on the inner or outer aspect of the heel, can help. These can shift pressure away from the side of the knee most bothered by osteoarthritis.  A brace called an "unloader" brace  also may be used to help ease the pressure on the most arthritic side of the knee.  If your caregiver has prescribed crutches, braces, wraps or ice, use as directed. The acronym for this is PRICE. This means protection, rest, ice, compression and elevation.  Nonsteroidal anti-inflammatory drugs (NSAID's), can help relieve pain. But if taken immediately after an injury, they may actually increase swelling. Take NSAID's with food in your stomach. Stop them if you develop stomach problems. Do not take these if you have a history of ulcers, stomach pain or bleeding from the bowel. Do not take without your caregiver's approval if you have problems with fluid retention, heart failure, or kidney problems.  For ongoing knee problems, physical therapy may be helpful.  Glucosamine and chondroitin are over-the-counter dietary supplements. Both may help relieve the pain of osteoarthritis in the knee. These medicines are different from the usual anti-inflammatory drugs. Glucosamine may decrease the rate of cartilage destruction.  Injections of a corticosteroid drug into your knee joint may help reduce the symptoms of an arthritis flare-up. They may provide pain relief that lasts a few months. You may have to wait a few months between injections. The injections do have a small increased risk of infection, water retention and elevated blood sugar levels.  Hyaluronic acid injected into damaged joints may ease pain and provide lubrication. These injections may work by reducing inflammation. A series of shots may give relief for as long as 6 months.  Topical painkillers. Applying certain ointments to your skin may help relieve the pain and stiffness of osteoarthritis. Ask your pharmacist for suggestions. Many over the-counter products are approved for temporary relief of arthritis pain.  In some countries, doctors often prescribe topical NSAID's for relief of chronic conditions such as arthritis and tendinitis. A  review of treatment with NSAID creams found that they worked as well as oral medications but without the serious side effects. PREVENTION  Maintain a healthy weight. Extra pounds put more strain on your joints.  Get strong, stay limber. Weak muscles are a common cause of knee injuries. Stretching is important. Include flexibility exercises in your workouts.  Be smart about exercise. If you have osteoarthritis, chronic knee pain or recurring injuries, you may need to change the way you exercise. This does not mean you have to stop being active. If your knees ache after jogging or playing basketball, consider switching to swimming, water aerobics or other low-impact activities, at least for a few days a week. Sometimes limiting high-impact activities will provide relief.  Make sure your shoes fit well. Choose footwear that is right for your sport.  Protect your knees. Use the proper gear for knee-sensitive activities. Use kneepads when playing volleyball or laying carpet. Buckle your seat belt every time you drive. Most shattered kneecaps occur in car accidents.  Rest when you are tired. SEEK MEDICAL CARE IF:  You have knee pain that is continual and does not seem to be getting better.  SEEK IMMEDIATE MEDICAL CARE IF:  Your knee joint feels hot  to the touch and you have a high fever. MAKE SURE YOU:   Understand these instructions.  Will watch your condition.  Will get help right away if you are not doing well or get worse. Document Released: 06/30/2007 Document Revised: 11/25/2011 Document Reviewed: 06/30/2007 [facility] Patient Information 2013 [place], [place].  Osteoarthritis Osteoarthritis is the most common form of arthritis. It is redness, soreness, and swelling (inflammation) affecting the cartilage. Cartilage acts as a cushion, covering the ends of bones where they meet to form a joint. CAUSES  Over time, the cartilage begins to wear away. This causes bone to rub on bone. This  produces pain and stiffness in the affected joints. Factors that contribute to this problem are:  Excessive body weight.  Age.  Overuse of joints. SYMPTOMS   People with osteoarthritis usually experience joint pain, swelling, or stiffness.  Over time, the joint may lose its normal shape.  Small deposits of bone (osteophytes) may grow on the edges of the joint.  Bits of bone or cartilage can break off and float inside the joint space. This may cause more pain and damage.  Osteoarthritis can lead to depression, anxiety, feelings of helplessness, and limitations on daily activities. The most commonly affected joints are in the:  Ends of the fingers.  Thumbs.  Neck.  Lower back.  Knees.  Hips. DIAGNOSIS  Diagnosis is mostly based on your symptoms and exam. Tests may be helpful, including:  X-rays of the affected joint.  A computerized magnetic scan (MRI).  Blood tests to rule out other types of arthritis.  Joint fluid tests. This involves using a needle to draw fluid from the joint and examining the fluid under a microscope. TREATMENT  Goals of treatment are to control pain, improve joint function, maintain a normal body weight, and maintain a healthy lifestyle. Treatment approaches may include:  A prescribed exercise program with rest and joint relief.  Weight control with nutritional education.  Pain relief techniques such as:  Properly applied heat and cold.  Electric pulses delivered to nerve endings under the skin (transcutaneous electrical nerve stimulation, TENS).  Massage.  Certain supplements. Ask your caregiver before using any supplements, especially in combination with prescribed drugs.  Medicines to control pain, such as:  Acetaminophen.  Nonsteroidal anti-inflammatory drugs (NSAIDs), such as naproxen.  Narcotic or central-acting agents, such as tramadol. This drug carries a risk of addiction and is generally prescribed for short-term  use.  Corticosteroids. These can be given orally or as injection. This is a short-term treatment, not recommended for routine use.  Surgery to reposition the bones and relieve pain (osteotomy) or to remove loose pieces of bone and cartilage. Joint replacement may be needed in advanced states of osteoarthritis. HOME CARE INSTRUCTIONS  Your caregiver can recommend specific types of exercise. These may include:  Strengthening exercises. These are done to strengthen the muscles that support joints affected by arthritis. They can be performed with weights or with exercise bands to add resistance.  Aerobic activities. These are exercises, such as brisk walking or low-impact aerobics, that get your heart pumping. They can help keep your lungs and circulatory system in shape.  Range-of-motion activities. These keep your joints limber.  Balance and agility exercises. These help you maintain daily living skills. Learning about your condition and being actively involved in your care will help improve the course of your osteoarthritis. SEEK MEDICAL CARE IF:   You feel hot or your skin turns red.  You develop a rash in addition  to your joint pain.  You have an oral temperature above 102 F (38.9 C). FOR MORE INFORMATION  National Institute of Arthritis and Musculoskeletal and Skin Diseases: www.niams.http://www.myers.net/ [employer] on Aging: https://walker.com/ American College of Rheumatology: www.rheumatology.org Document Released: 09/02/2005 Document Revised: 11/25/2011 Document Reviewed: 12/14/2009 [facility] Patient Information 2013 [place], [place].

## 2013-01-26 NOTE — ED Notes (Signed)
Pt is c/o pain and swelling to both her knees states the right is worse than the left  Pt states the right knee has 2 blisters that have formed on the top of her knee  Pt states the only thing she has done different is she started walking for exercise about 2 weeks ago  Pt states the pain started last week and has gotten worse

## 2013-01-26 NOTE — ED Provider Notes (Signed)
History    This chart was scribed for non-physician practitioner Johnnette Gourd working with Celene Kras, MD by Quintella Reichert, ED Scribe. This patient was seen in room WTR5/WTR5 and the patient's care was started at 8:16 PM .   CSN: [id number]  Arrival date & time 01/26/13  1938      Chief Complaint  Patient presents with  . Knee Pain     The history is provided by the patient. No language interpreter was used.    HPI Comments: Marie Tate is a 52 y.o. female who presents to the Emergency Department complaining of constant, gradual-onset, gradually-worsening bilateral knee pain that began 5 days ago, with accompanying swelling.  Both pain and swelling are worse on the right than the left.  Pt states that she took a longer walk than her normal daily walk and the following day experienced moderate pain and swelling in her right knee.  She attempted to treat with an epsom salt soak, which relieved pain temporarily.  The following day she also began to experience less severe pain and swelling in her left knee. Pt has attempted to treat pain today by taking 2 Aspirin today, which relieved pain slightly. Presently she reports severity of right knee pain at 6/10.  Pt states that both knees are visibly swollen but that the right is more swollen than the left.     Past Medical History  Diagnosis Date  . Hypertension     Past Surgical History  Procedure Laterality Date  . Uterine fibroid surgery      Family History  Problem Relation Age of Onset  . Heart attack Mother   . Stroke Father     History  Substance Use Topics  . Smoking status: Former Games developer  . Smokeless tobacco: Not on file  . Alcohol Use: Yes     Comment: occ    OB History   Grav Para Term Preterm Abortions TAB SAB Ect Mult Living                  Review of Systems  Musculoskeletal: Positive for joint swelling (Both knees) and arthralgias (Both knees).  All other systems reviewed and are  negative.    Allergies  Review of patient's allergies indicates no known allergies.  Home Medications   Current Outpatient Rx  Name  Route  Sig  Dispense  Refill  . aspirin 325 MG tablet   Oral   Take 325 mg by mouth every 4 (four) hours as needed. For pain relief         . Biotin 1000 MCG tablet   Oral   Take 1,000 mcg by mouth 3 (three) times daily.         Marland Kitchen VITAMIN D, CHOLECALCIFEROL, PO   Oral   Take 1 tablet by mouth daily.         Marland Kitchen lisinopril-hydrochlorothiazide (PRINZIDE,ZESTORETIC) 20-12.5 MG per tablet   Oral   Take 1 tablet by mouth daily.           BP 161/88  Pulse 96  Temp(Src) 98.8 F (37.1 C) (Oral)  Resp 16  Ht 5\' 4"  (1.626 m)  Wt 203 lb (92.08 kg)  BMI 34.83 kg/m2  SpO2 97%  LMP 01/12/2013  Physical Exam  Nursing note and vitals reviewed. Constitutional: She is oriented to person, place, and time. She appears well-developed and well-nourished. No distress.  HENT:  Head: Normocephalic and atraumatic.  Eyes: Conjunctivae and EOM are normal.  Neck:  Normal range of motion. Neck supple.  Cardiovascular: Normal rate.   Pulmonary/Chest: Effort normal. No respiratory distress.  Musculoskeletal: Normal range of motion. She exhibits edema (Right lateral jointline and left medial jointline.). She exhibits no tenderness.       Right knee: She exhibits swelling. She exhibits no effusion and no bony tenderness.       Left knee: She exhibits swelling. She exhibits no effusion and no bony tenderness.  Pain with ROM of both knees. Ambulating without difficulty.  Neurological: She is alert and oriented to person, place, and time. No sensory deficit.  Skin: Skin is warm and dry.  Psychiatric: She has a normal mood and affect. Her behavior is normal.    ED Course  Procedures (including critical care time)  DIAGNOSTIC STUDIES: Oxygen Saturation is 97% on room air, normal by my interpretation.    COORDINATION OF CARE: 8:23 PM-Explained that  symptoms may be due to arthritis.  Discussed treatment plan which includes naproxen, rest, ice.      Labs Reviewed - No data to display   1. Knee pain, bilateral   2. Osteoarthritis       MDM  52 y/o female with knee pain. Ambulating without difficulty. Discussed conservative measures including naproxen, rest, ice. Resource guide given for PCP follow up. Patient states understanding of plan and is agreeable.      I personally performed the services described in this documentation, which was scribed in my presence. The recorded information has been reviewed and is accurate.   Trevor Mace, PA-C 01/26/13 2043

## 2013-01-27 NOTE — ED Provider Notes (Signed)
Medical screening examination/treatment/procedure(s) were performed by non-physician practitioner and as supervising physician I was immediately available for consultation/collaboration.    Celene Kras, MD 01/27/13 [phone]

## 2013-02-23 ENCOUNTER — Emergency Department (INDEPENDENT_AMBULATORY_CARE_PROVIDER_SITE_OTHER): Payer: BC Managed Care – PPO

## 2013-02-23 ENCOUNTER — Emergency Department (HOSPITAL_COMMUNITY)
Admission: EM | Admit: 2013-02-23 | Discharge: 2013-02-23 | Disposition: A | Discharge status: Home / Self Care | Payer: BC Managed Care – PPO | Source: Home / Self Care | Attending: Emergency Medicine | Admitting: Emergency Medicine

## 2013-02-23 ENCOUNTER — Encounter (HOSPITAL_COMMUNITY): Payer: Self-pay | Admitting: Emergency Medicine

## 2013-02-23 DIAGNOSIS — M2240 Chondromalacia patellae, unspecified knee: Secondary | ICD-10-CM

## 2013-02-23 DIAGNOSIS — T148XXA Other injury of unspecified body region, initial encounter: Secondary | ICD-10-CM

## 2013-02-23 MED ORDER — PREDNISONE 20 MG PO TABS: ORAL_TABLET | ORAL | Status: DC

## 2013-02-23 MED ORDER — METHYLPREDNISOLONE ACETATE 80 MG/ML IJ SUSP
80.0000 mg | Freq: Once | INTRAMUSCULAR | Status: AC
  Administered 2013-02-23: 80 mg via INTRAMUSCULAR

## 2013-02-23 MED ORDER — DICLOFENAC SODIUM 75 MG PO TBEC: 75.0000 mg | DELAYED_RELEASE_TABLET | Freq: Two times a day (BID) | ORAL | Status: DC

## 2013-02-23 MED ORDER — HYDROCODONE-ACETAMINOPHEN 5-325 MG PO TABS: ORAL_TABLET | ORAL | Status: DC

## 2013-02-23 MED ORDER — KETOROLAC TROMETHAMINE 60 MG/2ML IM SOLN
INTRAMUSCULAR | Status: AC
  Filled 2013-02-23: qty 2

## 2013-02-23 MED ORDER — KETOROLAC TROMETHAMINE 60 MG/2ML IM SOLN
60.0000 mg | Freq: Once | INTRAMUSCULAR | Status: AC
  Administered 2013-02-23: 60 mg via INTRAMUSCULAR

## 2013-02-23 MED ORDER — METHYLPREDNISOLONE ACETATE 80 MG/ML IJ SUSP
INTRAMUSCULAR | Status: AC
  Filled 2013-02-23: qty 1

## 2013-02-23 NOTE — ED Notes (Signed)
Pt. Stated, I started to exercise a month ago and then I started having rt. Knee pain and it has given me a fit. I went to ED about 3 weeks,

## 2013-02-23 NOTE — ED Notes (Signed)
Patient transported to X-ray 

## 2013-02-23 NOTE — ED Notes (Signed)
Return from xray

## 2013-02-23 NOTE — ED Provider Notes (Signed)
Chief Complaint:   Chief Complaint  Patient presents with  . Knee Pain    History of Present Illness:   Marie Tate is a-52 year-old female with a 2 month history of right knee pain and swelling. The pain came on in early May after she started doing some exercise walking. She recalls injuring that knee when she was 52 years old. She thinks she may have dislocated the patella. She's not had any trouble with it up until the past couple of months. The patient describes pain localized over the kneecap, crepitus, giving way, and locking of the joint. The pain is rated 8/10 in intensity. It's worse if she goes up steps or with prolonged sitting. It does not bother her walking on level ground or going downstairs. It does not bother her she stretches her leg out. She went to the emergency room in early May when this first began at [facility]. It was not x-rayed. She was given naproxen for the pain and told she might have arthritis. The naproxen helped for a while but now is not working at all.  Review of Systems:  Other than noted above, the patient denies any of the following symptoms: Systemic:  No fevers, chills, sweats, or aches.  No fatigue or tiredness. Musculoskeletal:  No joint pain, arthritis, bursitis, swelling, back pain, or neck pain.  Neurological:  No muscular weakness, paresthesias, headache, or trouble with speech or coordination.  No dizziness.  PMFSH:  Past medical history, family history, social history, meds, and allergies were reviewed.  No prior history of knee pain or arthritis.  She takes a blood pressure medication for her blood pressure.  Physical Exam:   Vital signs:  BP 170/87  Pulse 92  Temp(Src) 98.5 F (36.9 C) (Oral)  Resp 18  SpO2 97%  LMP 02/10/2013 Gen:  Alert and oriented times 3.  In no distress. Musculoskeletal: The knee looks puffy and there appears to be joint effusion. The knee has a limited range of motion. She can bend without much pain to 90, but  going beyond that causes pain. She has crepitus with flexion.   McMurray's test was positive.  Lachman's test was negative.  Anterior drawer test was negative.   Varus and valgus stress negative.  Otherwise, all joints had a full a ROM with no swelling, bruising or deformity.  No edema, pulses full. Extremities were warm and pink.  Capillary refill was brisk.  Skin:  Clear, warm and dry.  No rash. Neuro:  Alert and oriented times 3.  Muscle strength was normal.  Sensation was intact to light touch.   Radiology:  Dg Knee Complete 4 Views Right  02/23/2013   *RADIOLOGY REPORT*  Clinical Data: Progressive right knee pain.  Right knee swelling. No known injury.  RIGHT KNEE - COMPLETE 4+ VIEW  Comparison: None.  Findings: A small to moderate knee joint effusion is seen.  No evidence of fracture or dislocation.  No evidence of joint space narrowing.  Mild subchondral lucency is seen along the articular surface of the patella, possibly due to patellar osteochondral defect or injury.  IMPRESSION:  Small to moderate knee joint effusion, and patellar subchondral lucency possibly due to osteochondral defect or injury.  Considered knee  MRI for further evaluation.   Original Report Authenticated By: Myles Rosenthal, M.D.   I reviewed the images independently and personally and concur with the radiologist's findings.   Course in Urgent Care Center:   She was placed in a  knee immobilizer and given Toradol 60 mg IM and Depo-Medrol 80 mg IM for pain and inflammation.  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 A diagnosis of Osteochondral fracture was also pertinent to this visit.  She'll need followup with an orthopedic surgeon and possibly arthroscopic surgery.  Plan:   1.  The following meds were prescribed:   New Prescriptions   DICLOFENAC (VOLTAREN) 75 MG EC TABLET    Take 1 tablet (75 mg total) by mouth 2 (two) times daily.   HYDROCODONE-ACETAMINOPHEN (NORCO/VICODIN) 5-325 MG  PER TABLET    1 to 2 tabs every 4 to 6 hours as needed for pain.   PREDNISONE (DELTASONE) 20 MG TABLET    3 daily for 4 days, 2 daily for 4 days, 1 daily for 4 days.   2.  The patient was instructed in symptomatic care, including rest and activity, elevation, application of ice and compression.  Appropriate handouts were given. 3.  The patient was told to return if becoming worse in any way, if no better in 3 or 4 days, and given some red flag symptoms such as worsening pain, fever, or neurological symptoms that would indicate earlier return.   4.  The patient was told to follow up with Dr. Annell Greening as soon as possible.    Reuben Likes, MD 02/23/13 2116

## 2014-03-02 ENCOUNTER — Other Ambulatory Visit: Payer: Self-pay

## 2014-03-02 DIAGNOSIS — Z1231 Encounter for screening mammogram for malignant neoplasm of breast: Secondary | ICD-10-CM

## 2014-03-08 ENCOUNTER — Ambulatory Visit: Payer: BC Managed Care – PPO

## 2014-04-01 ENCOUNTER — Emergency Department (INDEPENDENT_AMBULATORY_CARE_PROVIDER_SITE_OTHER)
Admission: EM | Admit: 2014-04-01 | Discharge: 2014-04-01 | Disposition: A | Discharge status: Home / Self Care | Payer: BC Managed Care – PPO | Source: Home / Self Care

## 2014-04-01 ENCOUNTER — Encounter (HOSPITAL_COMMUNITY): Payer: Self-pay | Admitting: Emergency Medicine

## 2014-04-01 DIAGNOSIS — J019 Acute sinusitis, unspecified: Secondary | ICD-10-CM

## 2014-04-01 MED ORDER — PREDNISONE 20 MG PO TABS: ORAL_TABLET | ORAL | Status: DC

## 2014-04-01 MED ORDER — TRIAMCINOLONE ACETONIDE 40 MG/ML IJ SUSP
40.0000 mg | Freq: Once | INTRAMUSCULAR | Status: AC
  Administered 2014-04-01: 40 mg via INTRAMUSCULAR

## 2014-04-01 MED ORDER — TRIAMCINOLONE ACETONIDE 40 MG/ML IJ SUSP
INTRAMUSCULAR | Status: AC
  Filled 2014-04-01: qty 1

## 2014-04-01 NOTE — ED Notes (Signed)
i think I have another sinus infection. C/o pain , pressure in right side of face, worse when she bends over

## 2014-04-01 NOTE — ED Provider Notes (Signed)
CSN: [id number]     Arrival date & time 04/01/14  9622 History   First MD Initiated Contact with Patient 04/01/14 1055     Chief Complaint  Patient presents with  . Facial Pain   (Consider location/radiation/quality/duration/timing/severity/associated sxs/prior Treatment) HPI Comments: 1 week ago with S.T. For a couple of days, burning nose, R head/face pain, cough, nasal congestion. No fever. Taking Coricidin.   Past Medical History  Diagnosis Date  . Hypertension    Past Surgical History  Procedure Laterality Date  . Uterine fibroid surgery     Family History  Problem Relation Age of Onset  . Heart attack Mother   . Stroke Father    History  Substance Use Topics  . Smoking status: Former Research scientist (life sciences)  . Smokeless tobacco: Not on file  . Alcohol Use: Yes     Comment: occ   OB History   Grav Para Term Preterm Abortions TAB SAB Ect Mult Living                 Review of Systems  Constitutional: Positive for activity change. Negative for fever, chills, appetite change and fatigue.  HENT: Positive for congestion, postnasal drip, rhinorrhea, sore throat and voice change. Negative for ear discharge, ear pain, facial swelling and trouble swallowing.   Eyes: Negative.   Respiratory: Positive for cough. Negative for shortness of breath and wheezing.   Cardiovascular: Negative.  Negative for leg swelling.  Gastrointestinal: Negative for nausea, vomiting, abdominal pain and diarrhea.       Burning in epigastrium.  Musculoskeletal: Negative for neck pain and neck stiffness.  Skin: Negative for pallor and rash.  Neurological: Negative.     Allergies  Review of patient's allergies indicates no known allergies.  Home Medications   Prior to Admission medications   Medication Sig Start Date End Date Taking? Authorizing Provider  aspirin 325 MG tablet Take 325 mg by mouth every 4 (four) hours as needed. For pain relief    Historical Provider, MD  Biotin 1000 MCG tablet Take 1,000 mcg  by mouth 3 (three) times daily.    Historical Provider, MD  diclofenac (VOLTAREN) 75 MG EC tablet Take 1 tablet (75 mg total) by mouth 2 (two) times daily. 02/23/13   Harden Mo, MD  HYDROcodone-acetaminophen (NORCO/VICODIN) 5-325 MG per tablet 1 to 2 tabs every 4 to 6 hours as needed for pain. 02/23/13   Harden Mo, MD  lisinopril-hydrochlorothiazide (PRINZIDE,ZESTORETIC) 20-12.5 MG per tablet Take 1 tablet by mouth daily.    Historical Provider, MD  naproxen (NAPROSYN) 500 MG tablet Take 1 tablet (500 mg total) by mouth 2 (two) times daily. 01/26/13   Illene Labrador, PA-C  predniSONE (DELTASONE) 20 MG tablet 3 daily for 4 days, 2 daily for 4 days, 1 daily for 4 days. 02/23/13   Harden Mo, MD  predniSONE (DELTASONE) 20 MG tablet Take 3 tabs po on first day, 2 tabs second day, 2 tabs third day, 1 tab fourth day, 1 tab 5th day. Take with food. Start 04/02/14 04/01/14   Janne Napoleon, NP  VITAMIN D, CHOLECALCIFEROL, PO Take 1 tablet by mouth daily.    Historical Provider, MD   BP 169/93  Pulse 80  Temp(Src) 98.4 F (36.9 C) (Oral)  Resp 18  SpO2 98% Physical Exam  Nursing note and vitals reviewed. Constitutional: She is oriented to person, place, and time. She appears well-developed and well-nourished. No distress.  HENT:  Right Ear: External ear normal.  Left Ear: External ear normal.  Mouth/Throat: Oropharynx is clear and moist. No oropharyngeal exudate.  Clear PND  Eyes: Conjunctivae are normal.  Neck: Normal range of motion. Neck supple.  Cardiovascular: Normal rate, regular rhythm and normal heart sounds.   Pulmonary/Chest: Effort normal and breath sounds normal. No respiratory distress. She has no wheezes.  Abdominal: Soft. There is no tenderness.  Musculoskeletal: Normal range of motion. She exhibits no edema.  Lymphadenopathy:    She has no cervical adenopathy.  Neurological: She is alert and oriented to person, place, and time.  Skin: Skin is warm and dry. No rash noted.   Psychiatric: She has a normal mood and affect.    ED Course  Procedures (including critical care time) Labs Review Labs Reviewed - No data to display  Imaging Review No results found.   MDM   1. Acute rhinosinusitis     Kenalog 40 mg IM Prednisone taper  Sudafed PE 10 mg for congestion Allegra 180 mg a day Nasal saline spray often flonase nasal spray Lots of cool liquids     Janne Napoleon, NP 04/01/14 1118

## 2014-04-03 NOTE — ED Provider Notes (Signed)
Medical screening examination/treatment/procedure(s) were performed by a resident physician or non-physician practitioner and as the supervising physician I was immediately available for consultation/collaboration.  Lynne Leader, MD    Gregor Hams, MD 04/03/14 [phone]

## 2014-04-17 ENCOUNTER — Encounter (HOSPITAL_COMMUNITY): Payer: Self-pay | Admitting: Emergency Medicine

## 2014-04-17 ENCOUNTER — Emergency Department (HOSPITAL_COMMUNITY)
Admission: EM | Admit: 2014-04-17 | Discharge: 2014-04-17 | Disposition: A | Discharge status: Home / Self Care | Payer: BC Managed Care – PPO | Attending: Emergency Medicine | Admitting: Emergency Medicine

## 2014-04-17 DIAGNOSIS — I10 Essential (primary) hypertension: Secondary | ICD-10-CM | POA: Insufficient documentation

## 2014-04-17 DIAGNOSIS — G608 Other hereditary and idiopathic neuropathies: Secondary | ICD-10-CM | POA: Insufficient documentation

## 2014-04-17 DIAGNOSIS — Z87891 Personal history of nicotine dependence: Secondary | ICD-10-CM | POA: Insufficient documentation

## 2014-04-17 DIAGNOSIS — Z79899 Other long term (current) drug therapy: Secondary | ICD-10-CM | POA: Insufficient documentation

## 2014-04-17 DIAGNOSIS — G629 Polyneuropathy, unspecified: Secondary | ICD-10-CM

## 2014-04-17 DIAGNOSIS — R209 Unspecified disturbances of skin sensation: Secondary | ICD-10-CM | POA: Insufficient documentation

## 2014-04-17 LAB — I-STAT CHEM 8, ED
BUN: 14 mg/dL (ref 6–23)
CHLORIDE: 101 meq/L (ref 96–112)
Calcium, Ion: 1.21 mmol/L (ref 1.12–1.23)
Creatinine, Ser: 0.8 mg/dL (ref 0.50–1.10)
Glucose, Bld: 99 mg/dL (ref 70–99)
HEMATOCRIT: 40 % (ref 36.0–46.0)
Hemoglobin: 13.6 g/dL (ref 12.0–15.0)
POTASSIUM: 3.9 meq/L (ref 3.7–5.3)
SODIUM: 136 meq/L — AB (ref 137–147)
TCO2: 26 mmol/L (ref 0–100)

## 2014-04-17 NOTE — ED Notes (Signed)
DR Cathleen Fears at bedside.

## 2014-04-17 NOTE — Discharge Instructions (Signed)
Peripheral Neuropathy Keep your scheduled appointment with Dr. Jeanie Cooks this week. Asked him to recheck your blood pressure. Today's is mildly elevated at 155/72 Peripheral neuropathy is a type of nerve damage. It affects nerves that carry signals between the spinal cord and other parts of the body. These are called peripheral nerves. With peripheral neuropathy, one nerve or a group of nerves may be damaged.  CAUSES  Many things can damage peripheral nerves. For some people with peripheral neuropathy, the cause is unknown. Some causes include:  Diabetes. This is the most common cause of peripheral neuropathy.  Injury to a nerve.  Pressure or stress on a nerve that lasts a long time.  Too little vitamin B. Alcoholism can lead to this.  Infections.  Autoimmune diseases, such as multiple sclerosis and systemic lupus erythematosus.  Inherited nerve diseases.  Some medicines, such as cancer drugs.  Toxic substances, such as lead and mercury.  Too little blood flowing to the legs.  Kidney disease.  Thyroid disease. SIGNS AND SYMPTOMS  Different people have different symptoms. The symptoms you have will depend on which of your nerves is damaged. Common symptoms include:  Loss of feeling (numbness) in the feet and hands.  Tingling in the feet and hands.  Pain that burns.  Very sensitive skin.  Weakness.  Not being able to move a part of the body (paralysis).  Muscle twitching.  Clumsiness or poor coordination.  Loss of balance.  Not being able to control your bladder.  Feeling dizzy.  Sexual problems. DIAGNOSIS  Peripheral neuropathy is a symptom, not a disease. Finding the cause of peripheral neuropathy can be hard. To figure that out, your health care provider will take a medical history and do a physical exam. A neurological exam will also be done. This involves checking things affected by your brain, spinal cord, and nerves (nervous system). For example, your  health care provider will check your reflexes, how you move, and what you can feel.  Other types of tests may also be ordered, such as:  Blood tests.  A test of the fluid in your spinal cord.  Imaging tests, such as CT scans or an MRI.  Electromyography (EMG). This test checks the nerves that control muscles.  Nerve conduction velocity tests. These tests check how fast messages pass through your nerves.  Nerve biopsy. A small piece of nerve is removed. It is then checked under a microscope. TREATMENT   Medicine is often used to treat peripheral neuropathy. Medicines may include:  Pain-relieving medicines. Prescription or over-the-counter medicine may be suggested.  Antiseizure medicine. This may be used for pain.  Antidepressants. These also may help ease pain from neuropathy.  Lidocaine. This is a numbing medicine. You might wear a patch or be given a shot.  Mexiletine. This medicine is typically used to help control irregular heart rhythms.  Surgery. Surgery may be needed to relieve pressure on a nerve or to destroy a nerve that is causing pain.  Physical therapy to help movement.  Assistive devices to help movement. HOME CARE INSTRUCTIONS   Only take over-the-counter or prescription medicines as directed by your health care provider. Follow the instructions carefully for any given medicines. Do not take any other medicines without first getting approval from your health care provider.  If you have diabetes, work closely with your health care provider to keep your blood sugar under control.  If you have numbness in your feet:  Check every day for signs of injury or infection.  Watch for redness, warmth, and swelling.  Wear padded socks and comfortable shoes. These help protect your feet.  Do not do things that put pressure on your damaged nerve.  Do not smoke. Smoking keeps blood from getting to damaged nerves.  Avoid or limit alcohol. Too much alcohol can cause a  lack of B vitamins. These vitamins are needed for healthy nerves.  Develop a good support system. Coping with peripheral neuropathy can be stressful. Talk to a mental health specialist or join a support group if you are struggling.  Follow up with your health care provider as directed. SEEK MEDICAL CARE IF:   You have new signs or symptoms of peripheral neuropathy.  You are struggling emotionally from dealing with peripheral neuropathy.  You have a fever. SEEK IMMEDIATE MEDICAL CARE IF:   You have an injury or infection that is not healing.  You feel very dizzy or begin vomiting.  You have chest pain.  You have trouble breathing. Document Released: 08/23/2002 Document Revised: 05/15/2011 Document Reviewed: 05/10/2013 [facility] Patient Information 2015 [place], [place]. This information is not intended to replace advice given to you by your health care provider. Make sure you discuss any questions you have with your health care provider.

## 2014-04-17 NOTE — ED Provider Notes (Signed)
CSN: [id number]     Arrival date & time 04/17/14  5456 History   First MD Initiated Contact with Patient 04/17/14 1013     Chief Complaint  Patient presents with  . Tingling  . Numbness     (Consider location/radiation/quality/duration/timing/severity/associated sxs/prior Treatment) HPI Complains of tingling, numbness and pain at bilateral palms of hands and soles of feet for the past 3 weeks symptoms are intermittent improved with walking or using her hands. No other associated symptoms no chest pain or shortness of breath no abdominal pain no headache. No treatment prior to coming here Past Medical History  Diagnosis Date  . Hypertension    Past Surgical History  Procedure Laterality Date  . Uterine fibroid surgery     Family History  Problem Relation Age of Onset  . Heart attack Mother   . Stroke Father    History  Substance Use Topics  . Smoking status: Former Research scientist (life sciences)  . Smokeless tobacco: Not on file  . Alcohol Use: Yes     Comment: occ   OB History   Grav Para Term Preterm Abortions TAB SAB Ect Mult Living                 Review of Systems  Constitutional: Negative.   HENT: Negative.   Respiratory: Negative.   Cardiovascular: Negative.   Gastrointestinal: Negative.   Musculoskeletal: Negative.   Skin: Negative.   Neurological: Positive for numbness.  Psychiatric/Behavioral: Negative.   All other systems reviewed and are negative.     Allergies  Review of patient's allergies indicates no known allergies.  Home Medications   Prior to Admission medications   Medication Sig Start Date End Date Taking? Authorizing Provider  aspirin 325 MG tablet Take 650 mg by mouth every 4 (four) hours as needed (pain). For pain relief   Yes Historical Provider, MD  Biotin 1000 MCG tablet Take 1,000 mcg by mouth 3 (three) times daily.   Yes Historical Provider, MD  cholecalciferol (VITAMIN D) 1000 UNITS tablet Take 1,000 Units by mouth daily.   Yes Historical Provider, MD   ibuprofen (ADVIL,MOTRIN) 200 MG tablet Take 400 mg by mouth every 6 (six) hours as needed (pain).   Yes Historical Provider, MD  lisinopril-hydrochlorothiazide (PRINZIDE,ZESTORETIC) 20-12.5 MG per tablet Take 1 tablet by mouth daily.   Yes Historical Provider, MD   BP 155/72  Pulse 96  Temp(Src) 98.3 F (36.8 C) (Oral)  Resp 20  Ht 5\' 3"  (1.6 m)  SpO2 100%  LMP 03/28/2014 Physical Exam  Nursing note and vitals reviewed. Constitutional: She is oriented to person, place, and time. She appears well-developed and well-nourished.  HENT:  Head: Normocephalic and atraumatic.  Eyes: Conjunctivae are normal. Pupils are equal, round, and reactive to light.  Neck: Neck supple. No tracheal deviation present. No thyromegaly present.  Cardiovascular: Normal rate and regular rhythm.   No murmur heard. Pulmonary/Chest: Effort normal and breath sounds normal.  Abdominal: Soft. Bowel sounds are normal. She exhibits no distension. There is no tenderness.  Musculoskeletal: Normal range of motion. She exhibits no edema and no tenderness.  Neurological: She is alert and oriented to person, place, and time. She has normal reflexes. No cranial nerve deficit. Coordination normal.  Gait normal Romberg normal prior drift normal finger to nose normal  Skin: Skin is warm and dry. No rash noted.  Psychiatric: She has a normal mood and affect.    ED Course  Procedures (including critical care time) Labs Review Labs Reviewed -  No data to display  Imaging Review No results found.   EKG Interpretation None     Results for orders placed during the hospital encounter of 04/17/14  I-STAT CHEM 8, ED      Result Value Ref Range   Sodium 136 (*) 137 - 147 mEq/L   Potassium 3.9  3.7 - 5.3 mEq/L   Chloride 101  96 - 112 mEq/L   BUN 14  6 - 23 mg/dL   Creatinine, Ser 0.80  0.50 - 1.10 mg/dL   Glucose, Bld 99  70 - 99 mg/dL   Calcium, Ion 1.21  1.12 - 1.23 mmol/L   TCO2 26  0 - 100 mmol/L   Hemoglobin 13.6   12.0 - 15.0 g/dL   HCT 40.0  36.0 - 46.0 %   No results found.  MDM  Symptoms and exam consistent with peripheral neuropathy. Pain keep scheduled appointment with Dr.Avbuere this week. Blood pressure recheck Diagnosis #1 peripheral neuropathy #2 hypertension Final diagnoses:  None        Orlie Dakin, MD 04/17/14 1125

## 2014-04-17 NOTE — ED Notes (Signed)
Pt. Reports tingling and numbness in hands and bottoms of feet radiating up both legs. Pt. Reports this started around July 18. Slight pain and tenderness reported on soles of feet. Daughter tried massage and was helpful for a couple days but came back.

## 2014-05-26 ENCOUNTER — Ambulatory Visit: Payer: Self-pay | Admitting: Gynecology

## 2014-07-07 ENCOUNTER — Ambulatory Visit: Payer: BC Managed Care – PPO

## 2014-07-13 ENCOUNTER — Other Ambulatory Visit: Payer: Self-pay | Admitting: Internal Medicine

## 2014-07-13 DIAGNOSIS — N951 Menopausal and female climacteric states: Secondary | ICD-10-CM

## 2014-07-26 ENCOUNTER — Ambulatory Visit
Admission: RE | Admit: 2014-07-26 | Discharge: 2014-07-26 | Disposition: A | Discharge status: Home / Self Care | Payer: BC Managed Care – PPO | Source: Ambulatory Visit

## 2014-07-26 DIAGNOSIS — Z1231 Encounter for screening mammogram for malignant neoplasm of breast: Secondary | ICD-10-CM

## 2014-08-04 ENCOUNTER — Other Ambulatory Visit: Payer: BC Managed Care – PPO

## 2014-08-10 ENCOUNTER — Other Ambulatory Visit: Payer: BC Managed Care – PPO

## 2014-08-22 ENCOUNTER — Ambulatory Visit
Admission: RE | Admit: 2014-08-22 | Discharge: 2014-08-22 | Disposition: A | Discharge status: Home / Self Care | Payer: BC Managed Care – PPO | Source: Ambulatory Visit | Attending: Internal Medicine | Admitting: Internal Medicine

## 2014-08-22 DIAGNOSIS — N951 Menopausal and female climacteric states: Secondary | ICD-10-CM

## 2014-11-17 LAB — HM COLONOSCOPY

## 2015-01-11 IMAGING — MG MM DIGITAL SCREENING BILAT
4 series · 4 of 4 positions shown · non-contrast
Comparison: Previous exams.

CLINICAL DATA: Screening.

DIGITAL BILATERAL SCREENING MAMMOGRAM WITH CAD

[R CC]
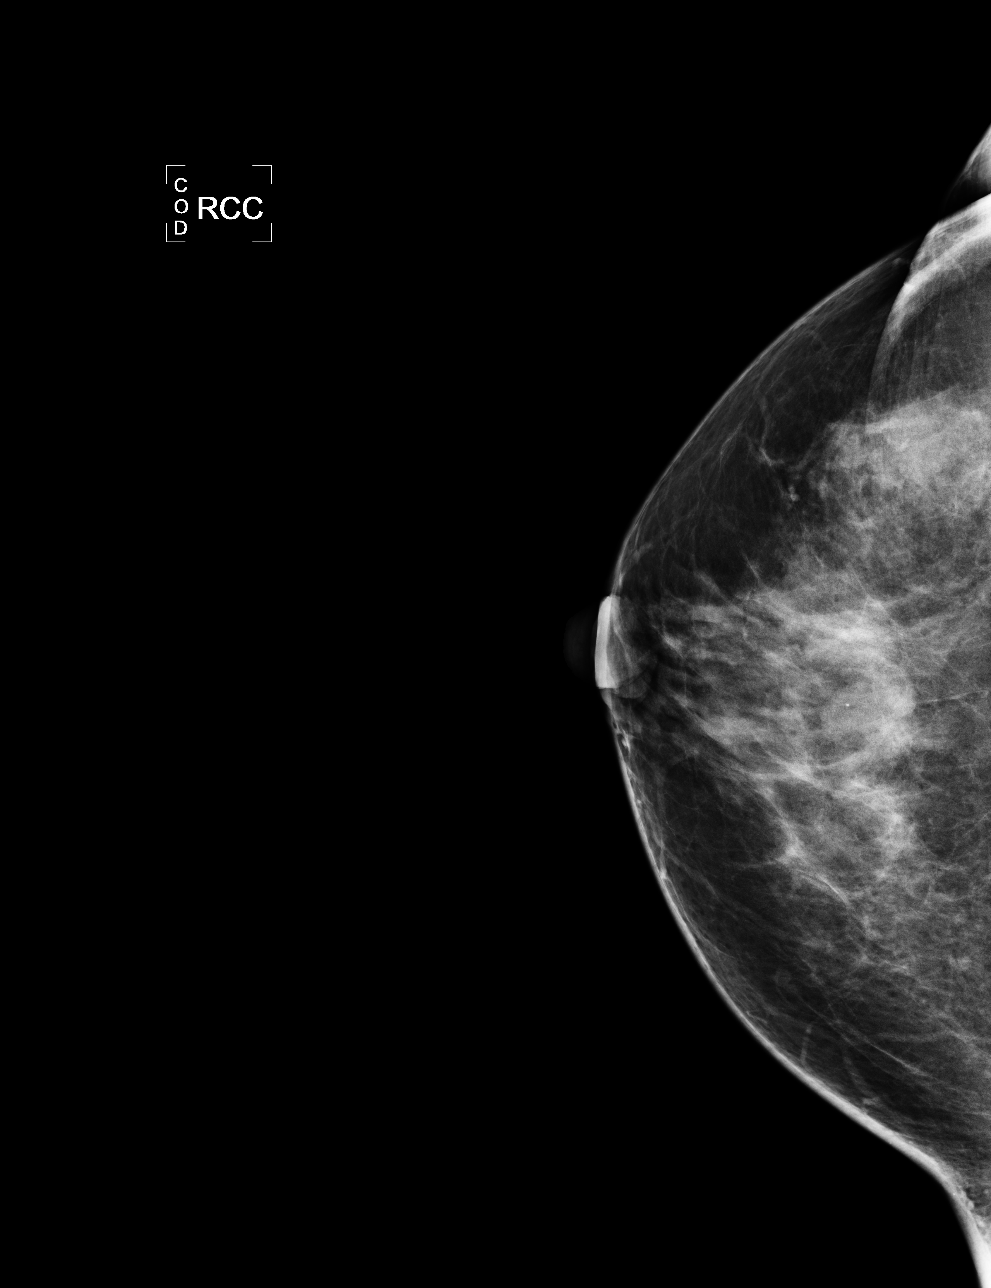

[L CC]
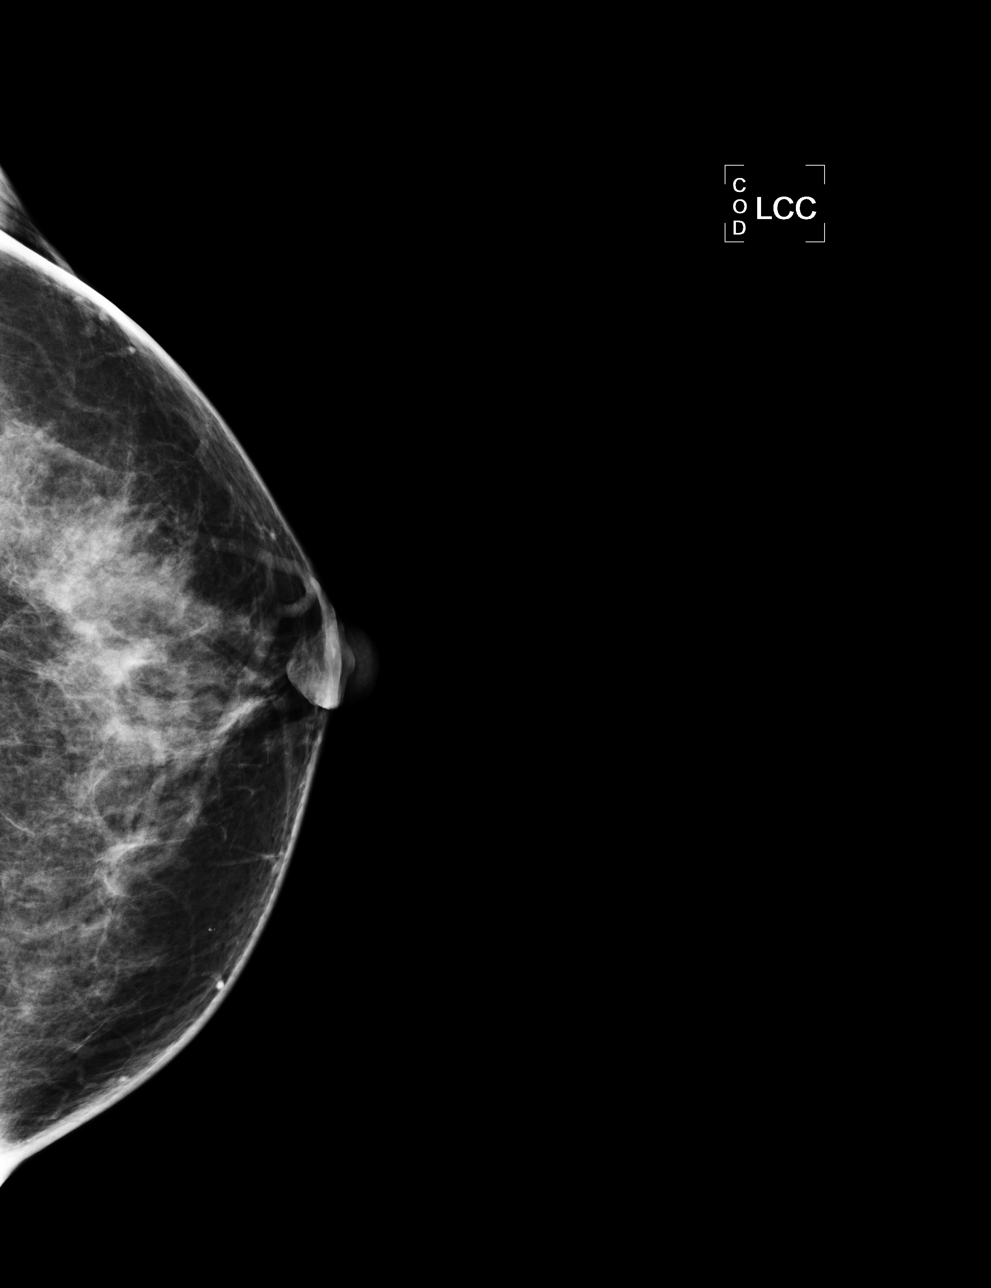

[L MLO]
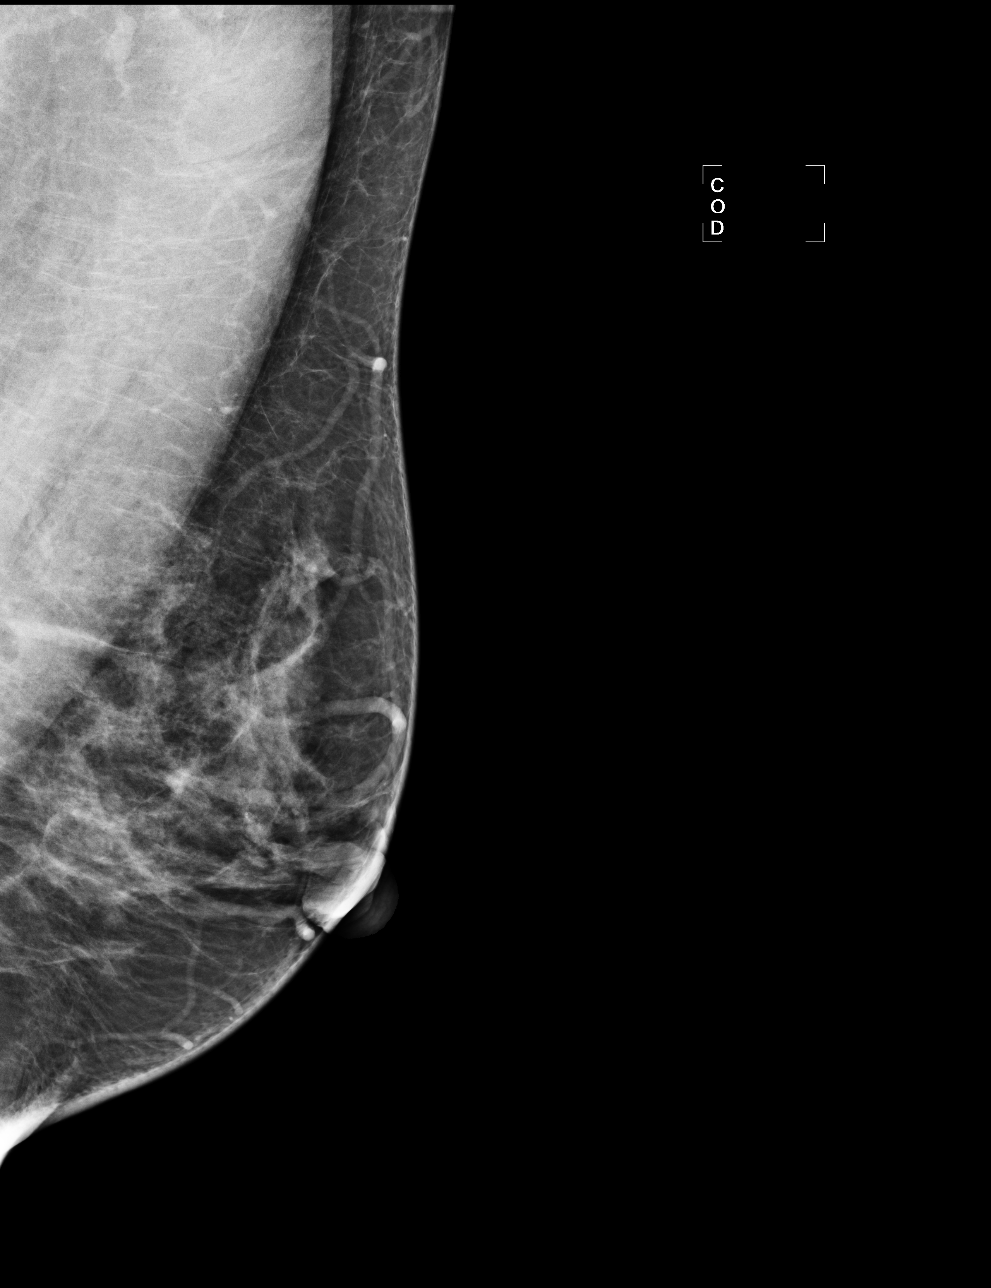

[R MLO]
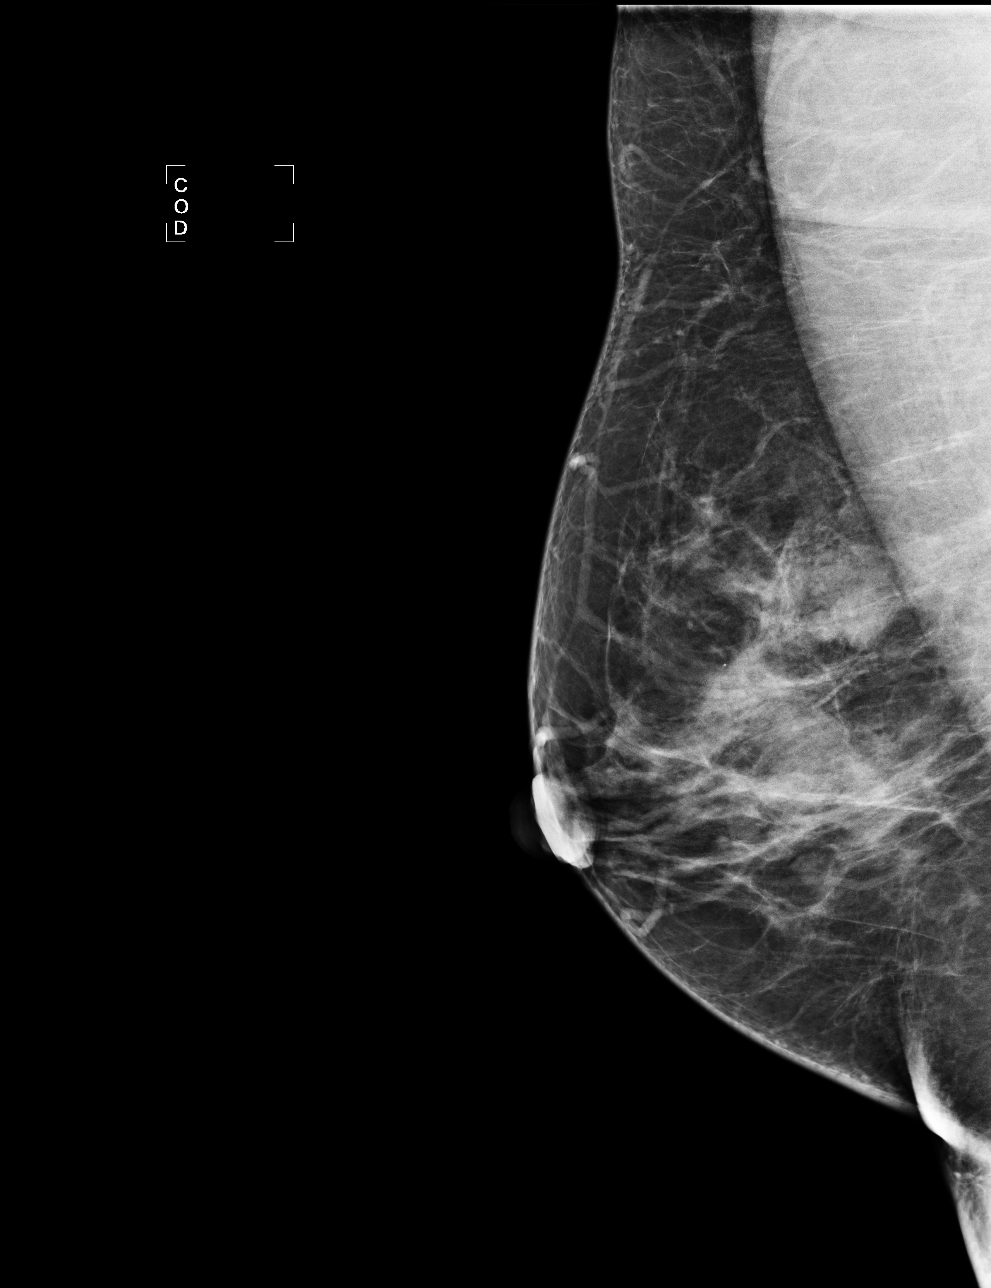

[4 of 4 positions shown; findings below may reference images not displayed]

FINDINGS: ACR Breast Density Category 3: The breast tissue is heterogeneously
dense.

No suspicious masses, architectural distortion, or calcifications
are present.

Images were processed with CAD.
IMPRESSION: No mammographic evidence of malignancy.

A result letter of this screening mammogram will be mailed directly
to the patient.

RECOMMENDATION:
Screening mammogram in one year. (Code:S5-2-VZT)

BI-RADS CATEGORY 1:  Negative.

## 2015-06-24 ENCOUNTER — Encounter (HOSPITAL_COMMUNITY): Payer: Self-pay

## 2015-06-24 ENCOUNTER — Emergency Department (HOSPITAL_COMMUNITY)
Admission: EM | Admit: 2015-06-24 | Discharge: 2015-06-24 | Disposition: A | Discharge status: Home / Self Care | Payer: Self-pay | Attending: Emergency Medicine | Admitting: Emergency Medicine

## 2015-06-24 ENCOUNTER — Emergency Department (HOSPITAL_COMMUNITY): Payer: Self-pay

## 2015-06-24 DIAGNOSIS — R202 Paresthesia of skin: Secondary | ICD-10-CM

## 2015-06-24 DIAGNOSIS — R197 Diarrhea, unspecified: Secondary | ICD-10-CM | POA: Insufficient documentation

## 2015-06-24 DIAGNOSIS — M7742 Metatarsalgia, left foot: Secondary | ICD-10-CM

## 2015-06-24 DIAGNOSIS — Z79899 Other long term (current) drug therapy: Secondary | ICD-10-CM | POA: Insufficient documentation

## 2015-06-24 DIAGNOSIS — I10 Essential (primary) hypertension: Secondary | ICD-10-CM

## 2015-06-24 DIAGNOSIS — M79672 Pain in left foot: Secondary | ICD-10-CM

## 2015-06-24 DIAGNOSIS — Z87891 Personal history of nicotine dependence: Secondary | ICD-10-CM | POA: Insufficient documentation

## 2015-06-24 LAB — I-STAT CHEM 8, ED
BUN: 9 mg/dL (ref 6–20)
CHLORIDE: 103 mmol/L (ref 101–111)
Calcium, Ion: 1.2 mmol/L (ref 1.12–1.23)
Creatinine, Ser: 0.7 mg/dL (ref 0.44–1.00)
Glucose, Bld: 99 mg/dL (ref 65–99)
HEMATOCRIT: 44 % (ref 36.0–46.0)
HEMOGLOBIN: 15 g/dL (ref 12.0–15.0)
POTASSIUM: 3.8 mmol/L (ref 3.5–5.1)
SODIUM: 140 mmol/L (ref 135–145)
TCO2: 26 mmol/L (ref 0–100)

## 2015-06-24 MED ORDER — HYDROCODONE-ACETAMINOPHEN 5-325 MG PO TABS: 1.0000 | ORAL_TABLET | Freq: Four times a day (QID) | ORAL | Status: DC | PRN

## 2015-06-24 MED ORDER — IBUPROFEN 400 MG PO TABS
400.0000 mg | ORAL_TABLET | Freq: Once | ORAL | Status: AC
  Administered 2015-06-24: 400 mg via ORAL
  Filled 2015-06-24: qty 1

## 2015-06-24 NOTE — ED Provider Notes (Signed)
CSN: [id number]     Arrival date & time 06/24/15  1620 History   First MD Initiated Contact with Patient 06/24/15 1728     Chief Complaint  Patient presents with  . Foot Pain  . Tingling     (Consider location/radiation/quality/duration/timing/severity/associated sxs/prior Treatment) HPI Comments: Marie Tate is a 54 y.o. female with a PMHx of HTN, who presents to the ED with complaints of one year of intermittent left foot pain that has worsened over the last 2-3 days. She describes that the pain is 10/10 constant sharp pain along the knuckles of the left foot, sharp, radiating up her leg occasionally, worse when taking the first few steps after a period of inactivity, and improved with Motrin and icy hot. She states in the past when she's had these symptoms she is eating more bananas and felt better, but recently she has been doing more activities on her feet because she is taking care of her husband after a car accident, and this has caused her pain to worsen. Occasionally she has right foot pain which is similar to left foot pain. She also reports intermittent tingling in her left hand and leg is currently resolved. Additionally she reports that today she had 3 loose stools which were nonbloody.  Denies fevers, chills, HA, vision changes, syncope, lightheadedness, dizziness, CP, SOB, LE swelling, abd pain, N/V/C, melena, hematochezia, hematuria, dysuria, myalgias, joint swelling, neck or back pain, skin changes to foot including erythema or bruising, numbness, weakness, or other skin changes/rashes.   Chart review reveals that she had a similar complaint last year in August 2015, chem 8 was done which was normal, and she was told to follow up with her PCP. She states she hasn't really followed up for her foot symptom because it was so intermittent.  Patient is a 54 y.o. female presenting with lower extremity pain. The history is provided by the patient and medical records. No language  interpreter was used.  Foot Pain This is a recurrent problem. The current episode started more than 1 year ago. The problem occurs intermittently (intermittent x25yr but constant x2 days). The problem has been unchanged. Associated symptoms include arthralgias (L foot). Pertinent negatives include no abdominal pain, chest pain, chills, fever, headaches, joint swelling, myalgias, nausea, neck pain, numbness, urinary symptoms, visual change, vomiting or weakness. The symptoms are aggravated by walking. She has tried NSAIDs (and icy hot) for the symptoms. The treatment provided moderate relief.    Past Medical History  Diagnosis Date  . Hypertension    Past Surgical History  Procedure Laterality Date  . Uterine fibroid surgery     Family History  Problem Relation Age of Onset  . Heart attack Mother   . Stroke Father    Social History  Substance Use Topics  . Smoking status: Former Research scientist (life sciences)  . Smokeless tobacco: None  . Alcohol Use: Yes     Comment: occ   OB History    No data available     Review of Systems  Constitutional: Negative for fever and chills.  Eyes: Negative for visual disturbance.  Respiratory: Negative for shortness of breath.   Cardiovascular: Negative for chest pain.  Gastrointestinal: Positive for diarrhea (x3 episodes today, looser than normal). Negative for nausea, vomiting, abdominal pain, constipation and blood in stool.  Genitourinary: Negative for dysuria and hematuria.  Musculoskeletal: Positive for arthralgias (L foot). Negative for myalgias, back pain, joint swelling and neck pain.  Skin: Negative for color change.  Allergic/Immunologic: Negative for  immunocompromised state.  Neurological: Negative for syncope, weakness, light-headedness, numbness and headaches.       +intermittent tingling in L hand/leg x31yr  Psychiatric/Behavioral: Negative for confusion.   10 Systems reviewed and are negative for acute change except as noted in the HPI.     Allergies  Review of patient's allergies indicates no known allergies.  Home Medications   Prior to Admission medications   Medication Sig Start Date End Date Taking? Authorizing Provider  aspirin 325 MG tablet Take 650 mg by mouth every 4 (four) hours as needed (pain). For pain relief    Historical Provider, MD  Biotin 1000 MCG tablet Take 1,000 mcg by mouth 3 (three) times daily.    Historical Provider, MD  cholecalciferol (VITAMIN D) 1000 UNITS tablet Take 1,000 Units by mouth daily.    Historical Provider, MD  ibuprofen (ADVIL,MOTRIN) 200 MG tablet Take 400 mg by mouth every 6 (six) hours as needed (pain).    Historical Provider, MD  lisinopril-hydrochlorothiazide (PRINZIDE,ZESTORETIC) 20-12.5 MG per tablet Take 1 tablet by mouth daily.    Historical Provider, MD   Triage VS: BP 136/105 mmHg  Pulse 110  Temp(Src) 98.7 F (37.1 C) (Oral)  Resp 16  Ht 5\' 3"  (1.6 m)  Wt 214 lb 4.8 oz (97.206 kg)  BMI 37.97 kg/m2  SpO2 97%  LMP 05/25/2015 EXAM VS: BP 137/86 mmHg  Pulse 100  Temp(Src) 98.7 F (37.1 C) (Oral)  Resp 15  Ht 5\' 3"  (1.6 m)  Wt 214 lb 4.8 oz (97.206 kg)  BMI 37.97 kg/m2  SpO2 98%  LMP 05/25/2015 Physical Exam  Constitutional: She is oriented to person, place, and time. Vital signs are normal. She appears well-developed and well-nourished.  Non-toxic appearance. No distress.  Afebrile, nontoxic, NAD. BP on exam 137/86, HR 80s-90s.   HENT:  Head: Normocephalic and atraumatic.  Mouth/Throat: Oropharynx is clear and moist and mucous membranes are normal.  Eyes: Conjunctivae and EOM are normal. Right eye exhibits no discharge. Left eye exhibits no discharge.  Neck: Normal range of motion. Neck supple. No spinous process tenderness and no muscular tenderness present. No rigidity. Normal range of motion present.  FROM intact without spinous process TTP, no bony stepoffs or deformities, no paraspinous muscle TTP or muscle spasms. No rigidity or meningeal signs. No  bruising or swelling.   Cardiovascular: Normal rate, regular rhythm, normal heart sounds and intact distal pulses.  Exam reveals no gallop and no friction rub.   No murmur heard. Tachycardic in triage which resolved on exam. RRR, nl s1/s2, no m/r/g, distal pulses intact, no pedal edema   Pulmonary/Chest: Effort normal and breath sounds normal. No respiratory distress. She has no decreased breath sounds. She has no wheezes. She has no rhonchi. She has no rales.  Abdominal: Soft. Normal appearance and bowel sounds are normal. She exhibits no distension. There is no tenderness. There is no rigidity, no rebound, no guarding, no CVA tenderness, no tenderness at McBurney's point and negative Murphy's sign.  Musculoskeletal: Normal range of motion.       Left foot: There is tenderness and bony tenderness. There is normal range of motion, no swelling, normal capillary refill, no crepitus and no deformity.       Feet:  L foot with mild TTP along metatarsal heads on digits 2-5, no swelling or deformity, no crepitus, FROM intact in all digits, no skin changes, cap refill brisk and present. Distal pulses intact in all extremities. Strength and sensation grossly intact in  all extremities.   Neurological: She is alert and oriented to person, place, and time. She has normal strength. No cranial nerve deficit or sensory deficit. Coordination and gait normal. GCS eye subscore is 4. GCS verbal subscore is 5. GCS motor subscore is 6.  CN 2-12 grossly intact A&O x4 GCS 15 Sensation and strength intact Gait nonataxic Coordination with finger-to-nose WNL Neg pronator drift   Skin: Skin is warm, dry and intact. No rash noted.  Psychiatric: She has a normal mood and affect.  Nursing note and vitals reviewed.   ED Course  Procedures (including critical care time) Labs Review Labs Reviewed  I-STAT CHEM 8, ED    Imaging Review Dg Foot Complete Left  06/24/2015   CLINICAL DATA:  Pain and paresthesias involving  the left foot localizing to the heads of the metatarsals which began approximately 4 days ago. No known injuries.  EXAM: LEFT FOOT - COMPLETE 3+ VIEW  COMPARISON:  None.  FINDINGS: No evidence of acute fracture or dislocation. Joint spaces well preserved. Well-preserved bone mineral density. No intrinsic osseous abnormalities. Note is made of phleboliths involving the subcutaneous fat of the anterior lower leg.  IMPRESSION: No osseous abnormality.   Electronically Signed   By: Evangeline Dakin M.D.   On: 06/24/2015 18:47   I have personally reviewed and evaluated these images and lab results as part of my medical decision-making.   EKG Interpretation None      MDM   Final diagnoses:  Left foot pain  Metatarsalgia of left foot  Paresthesia  HTN (hypertension), benign    54 y.o. female here with L foot pain intermittent x1 yr but constant x2 days, has been more physically active with helping her husband after a car accident. No known trauma. Some intermittent tingling in L hand and leg, now resolved. Sometimes has pain in R foot as well. On exam, NVI with soft compartments, tenderness to metatarsal heads of digits 2-5, no swelling or skin changes. No focal neuro deficits. Given tenderness with recently increased activity, will get xray. Pt requesting motrin since she's driving and can't be given narcotic. Will get chem 8 as well to assess her tingling. Will reassess shortly.   7:00 PM Xray neg. Chem 8 WNL. Will d/c home with pain meds, discussed ice/heat, and f/up with [facility] as well as PCP. I explained the diagnosis and have given explicit precautions to return to the ER including for any other new or worsening symptoms. The patient understands and accepts the medical plan as it's been dictated and I have answered their questions. Discharge instructions concerning home care and prescriptions have been given. The patient is STABLE and is discharged to home in good condition.  BP  146/93 mmHg  Pulse 80  Temp(Src) 98.7 F (37.1 C) (Oral)  Resp 23  Ht 5\' 3"  (1.6 m)  Wt 214 lb 4.8 oz (97.206 kg)  BMI 37.97 kg/m2  SpO2 99%  LMP 06/01/2015  Meds ordered this encounter  Medications  . ibuprofen (ADVIL,MOTRIN) tablet 400 mg    Sig:   . HYDROcodone-acetaminophen (NORCO) 5-325 MG tablet    Sig: Take 1 tablet by mouth every 6 (six) hours as needed for severe pain.    Dispense:  6 tablet    Refill:  0    Order Specific Question:  Supervising Provider    Answer:  Noemi Chapel [[id number]]     Evona Westra Camprubi-Soms, PA-C 06/24/15 [place], MD 06/25/15 [phone]

## 2015-06-24 NOTE — Discharge Instructions (Signed)
Your foot pain could be from pressure on the ends of the bones of the foot. Use a frozen ice bottle in the mornings to stretch and ice the area, and in the evenings soak your feet in warm water. Use tylenol or motrin, or norco as directed as needed for pain but don't drive or operate machinery while taking norco. Follow up with your regular doctor and with the [facility] in 1 week for ongoing management of your symptoms. Return to the ER for changes or worsening symptoms.   Musculoskeletal Pain Musculoskeletal pain is muscle and boney aches and pains. These pains can occur in any part of the body. Your caregiver may treat you without knowing the cause of the pain. They may treat you if blood or urine tests, X-rays, and other tests were normal.  CAUSES There is often not a definite cause or reason for these pains. These pains may be caused by a type of germ (virus). The discomfort may also come from overuse. Overuse includes working out too hard when your body is not fit. Boney aches also come from weather changes. Bone is sensitive to atmospheric pressure changes. HOME CARE INSTRUCTIONS   Ask when your test results will be ready. Make sure you get your test results.  Only take over-the-counter or prescription medicines for pain, discomfort, or fever as directed by your caregiver. If you were given medications for your condition, do not drive, operate machinery or power tools, or sign legal documents for 24 hours. Do not drink alcohol. Do not take sleeping pills or other medications that may interfere with treatment.  Continue all activities unless the activities cause more pain. When the pain lessens, slowly resume normal activities. Gradually increase the intensity and duration of the activities or exercise.  During periods of severe pain, bed rest may be helpful. Lay or sit in any position that is comfortable.  Putting ice on the injured area.  Put ice in a bag.  Place a towel between  your skin and the bag.  Leave the ice on for 15 to 20 minutes, 3 to 4 times a day.  Follow up with your caregiver for continued problems and no reason can be found for the pain. If the pain becomes worse or does not go away, it may be necessary to repeat tests or do additional testing. Your caregiver may need to look further for a possible cause. SEEK IMMEDIATE MEDICAL CARE IF:  You have pain that is getting worse and is not relieved by medications.  You develop chest pain that is associated with shortness or breath, sweating, feeling sick to your stomach (nauseous), or throw up (vomit).  Your pain becomes localized to the abdomen.  You develop any new symptoms that seem different or that concern you. MAKE SURE YOU:   Understand these instructions.  Will watch your condition.  Will get help right away if you are not doing well or get worse.   This information is not intended to replace advice given to you by your health care provider. Make sure you discuss any questions you have with your health care provider.   Document Released: 09/02/2005 Document Revised: 11/25/2011 Document Reviewed: 05/07/2013 Elsevier Interactive Patient Education 2016 [employer]  Paresthesia Paresthesia is an abnormal burning or prickling sensation. This sensation is generally felt in the hands, arms, legs, or feet. However, it may occur in any part of the body. Usually, it is not painful. The feeling may be described as:  Tingling  or numbness.  Pins and needles.  Skin crawling.  Buzzing.  Limbs falling asleep.  Itching. Most people experience temporary (transient) paresthesia at some time in their lives. Paresthesia may occur when you breathe too quickly (hyperventilation). It can also occur without any apparent cause. Commonly, paresthesia occurs when pressure is placed on a nerve. The sensation quickly goes away after the pressure is removed. For some people, however, paresthesia is a  long-lasting (chronic) condition that is caused by an underlying disorder. If you continue to have paresthesia, you may need further medical evaluation. HOME CARE INSTRUCTIONS Watch your condition for any changes. Taking the following actions may help to lessen any discomfort that you are feeling:  Avoid drinking alcohol.  Try acupuncture or massage to help relieve your symptoms.  Keep all follow-up visits as directed by your health care provider. This is important. SEEK MEDICAL CARE IF:  You continue to have episodes of paresthesia.  Your burning or prickling feeling gets worse when you walk.  You have pain, cramps, or dizziness.  You develop a rash. SEEK IMMEDIATE MEDICAL CARE IF:  You feel weak.  You have trouble walking or moving.  You have problems with speech, understanding, or vision.  You feel confused.  You cannot control your bladder or bowel movements.  You have numbness after an injury.  You faint.   This information is not intended to replace advice given to you by your health care provider. Make sure you discuss any questions you have with your health care provider.   Document Released: 08/23/2002 Document Revised: 01/17/2015 Document Reviewed: 08/29/2014 Elsevier Interactive Patient Education 2016 [place] therapy can help ease sore, stiff, injured, and tight muscles and joints. Heat relaxes your muscles, which may help ease your pain.  RISKS AND COMPLICATIONS If you have any of the following conditions, do not use heat therapy unless your health care provider has approved:  Poor circulation.  Healing wounds or scarred skin in the area being treated.  Diabetes, heart disease, or high blood pressure.  Not being able to feel (numbness) the area being treated.  Unusual swelling of the area being treated.  Active infections.  Blood clots.  Cancer.  Inability to communicate pain. This may include young children and people  who have problems with their brain function (dementia).  Pregnancy. Heat therapy should only be used on old, pre-existing, or long-lasting (chronic) injuries. Do not use heat therapy on new injuries unless directed by your health care provider. HOW TO USE HEAT THERAPY There are several different kinds of heat therapy, including:  Moist heat pack.  Warm water bath.  Hot water bottle.  Electric heating pad.  Heated gel pack.  Heated wrap.  Electric heating pad. Use the heat therapy method suggested by your health care provider. Follow your health care provider's instructions on when and how to use heat therapy. GENERAL HEAT THERAPY RECOMMENDATIONS  Do not sleep while using heat therapy. Only use heat therapy while you are awake.  Your skin may turn pink while using heat therapy. Do not use heat therapy if your skin turns red.  Do not use heat therapy if you have new pain.  High heat or long exposure to heat can cause burns. Be careful when using heat therapy to avoid burning your skin.  Do not use heat therapy on areas of your skin that are already irritated, such as with a rash or sunburn. SEEK MEDICAL CARE IF:  You have blisters, redness,  swelling, or numbness.  You have new pain.  Your pain is worse. MAKE SURE YOU:  Understand these instructions.  Will watch your condition.  Will get help right away if you are not doing well or get worse.   This information is not intended to replace advice given to you by your health care provider. Make sure you discuss any questions you have with your health care provider.   Document Released: 11/25/2011 Document Revised: 09/23/2014 Document Reviewed: 10/26/2013 Elsevier Interactive Patient Education 2016 Else[employer] Inc[employer]  Cryotherapy Cryotherapy is when you put ice on your injury. Ice helps lessen pain and puffiness (swelling) after an injury. Ice works the best when you start using it in the first 24 to 48 hours after an  injury. HOME CARE  Put a dry or damp towel between the ice pack and your skin.  You may press gently on the ice pack.  Leave the ice on for no more than 10 to 20 minutes at a time.  Check your skin after 5 minutes to make sure your skin is okay.  Rest at least 20 minutes between ice pack uses.  Stop using ice when your skin loses feeling (numbness).  Do not use ice on someone who cannot tell you when it hurts. This includes small children and people with memory problems (dementia). GET HELP RIGHT AWAY IF:  You have white spots on your skin.  Your skin turns blue or pale.  Your skin feels waxy or hard.  Your puffiness gets worse. MAKE SURE YOU:   Understand these instructions.  Will watch your condition.  Will get help right away if you are not doing well or get worse.   This information is not intended to replace advice given to you by your health care provider. Make sure you discuss any questions you have with your health care provider.   Document Released: 02/19/2008 Document Revised: 11/25/2011 Document Reviewed: 04/25/2011 Elsevier Interactive Patient Education [employer].

## 2015-06-24 NOTE — ED Notes (Signed)
Pt reports left, sharp foot pain onset 2 days ago. Pain radiates to left leg and and arm; worse when pt first getting up from laying down.Pt states her right hand "has a tingly" feeling. No redness, swelling, or any discoloration to foot. Denies injury. Neuro exam intact.

## 2015-08-16 ENCOUNTER — Emergency Department (HOSPITAL_COMMUNITY)
Admission: EM | Admit: 2015-08-16 | Discharge: 2015-08-17 | Disposition: A | Discharge status: Home / Self Care | Payer: BLUE CROSS/BLUE SHIELD | Attending: Emergency Medicine | Admitting: Emergency Medicine

## 2015-08-16 ENCOUNTER — Encounter (HOSPITAL_COMMUNITY): Payer: Self-pay

## 2015-08-16 ENCOUNTER — Emergency Department (HOSPITAL_COMMUNITY): Payer: BLUE CROSS/BLUE SHIELD

## 2015-08-16 DIAGNOSIS — R05 Cough: Secondary | ICD-10-CM | POA: Diagnosis present

## 2015-08-16 DIAGNOSIS — Z87891 Personal history of nicotine dependence: Secondary | ICD-10-CM | POA: Insufficient documentation

## 2015-08-16 DIAGNOSIS — Z79899 Other long term (current) drug therapy: Secondary | ICD-10-CM | POA: Insufficient documentation

## 2015-08-16 DIAGNOSIS — Z7982 Long term (current) use of aspirin: Secondary | ICD-10-CM | POA: Diagnosis not present

## 2015-08-16 DIAGNOSIS — J0100 Acute maxillary sinusitis, unspecified: Secondary | ICD-10-CM | POA: Diagnosis not present

## 2015-08-16 DIAGNOSIS — I10 Essential (primary) hypertension: Secondary | ICD-10-CM | POA: Insufficient documentation

## 2015-08-16 MED ORDER — AZITHROMYCIN 250 MG PO TABS
500.0000 mg | ORAL_TABLET | Freq: Once | ORAL | Status: AC
  Administered 2015-08-17: 500 mg via ORAL
  Filled 2015-08-16: qty 2

## 2015-08-16 MED ORDER — AZITHROMYCIN 250 MG PO TABS: 250.0000 mg | ORAL_TABLET | Freq: Every day | ORAL | Status: DC

## 2015-08-16 MED ORDER — HYDROCOD POLST-CPM POLST ER 10-8 MG/5ML PO SUER: 5.0000 mL | Freq: Every evening | ORAL | Status: DC | PRN

## 2015-08-16 NOTE — Discharge Instructions (Signed)

## 2015-08-16 NOTE — ED Provider Notes (Signed)
CSN: [id number]     Arrival date & time 08/16/15  2227 History   By signing my name below, I, [name], attest that this documentation has been prepared under the direction and in the presence of Debria Broecker PA-C.  Electronically Signed: [name], ED Scribe. 08/16/2015. 11:42 PM.   Chief Complaint  Patient presents with  . Cough    PRODUCTIVE X1 WEEK   The history is provided by the patient. No language interpreter was used.    HPI Comments: Marie Tate is a 54 y.o. female with a PMHx of HTN who presents to the Emergency Department complaining of constant, ongoing cough x 1 week; worsened in last 4 days. Cough is made worse at night time. No alleviating factors at this time. Ongoing chest tightness, nasal congestion and postnasal drip also reported. No OTC medications or home remedies attempted prior to arrival. No recent fever, chills, nausea, or vomiting.  PCP: Philis Fendt, MD    Past Medical History  Diagnosis Date  . Hypertension    Past Surgical History  Procedure Laterality Date  . Uterine fibroid surgery     Family History  Problem Relation Age of Onset  . Heart attack Mother   . Stroke Father    Social History  Substance Use Topics  . Smoking status: Former Research scientist (life sciences)  . Smokeless tobacco: None  . Alcohol Use: Yes     Comment: occ   OB History    No data available     Review of Systems  Constitutional: Negative for fever and chills.  HENT: Positive for congestion.   Respiratory: Positive for cough.   Gastrointestinal: Negative for nausea and vomiting.      Allergies  Review of patient's allergies indicates no known allergies.  Home Medications   Prior to Admission medications   Medication Sig Start Date End Date Taking? Authorizing Provider  aspirin 325 MG tablet Take 650 mg by mouth every 4 (four) hours as needed (pain). For pain relief    Historical Provider, MD  Biotin 1000 MCG tablet Take 1,000 mcg by mouth 3 (three) times daily.     Historical Provider, MD  cholecalciferol (VITAMIN D) 1000 UNITS tablet Take 1,000 Units by mouth daily.    Historical Provider, MD  HYDROcodone-acetaminophen (NORCO) 5-325 MG tablet Take 1 tablet by mouth every 6 (six) hours as needed for severe pain. 06/24/15   Mercedes Camprubi-Soms, PA-C  ibuprofen (ADVIL,MOTRIN) 200 MG tablet Take 400 mg by mouth every 6 (six) hours as needed (pain).    Historical Provider, MD  lisinopril-hydrochlorothiazide (PRINZIDE,ZESTORETIC) 20-12.5 MG per tablet Take 1 tablet by mouth daily.    Historical Provider, MD   Triage Vitals: BP 196/98 mmHg  Pulse 122  Temp(Src) 97.6 F (36.4 C) (Oral)  Resp 20  Ht 5\' 3"  (1.6 m)  Wt 216 lb 6.4 oz (98.158 kg)  BMI 38.34 kg/m2  SpO2 96%  LMP 08/02/2015   Physical Exam  Constitutional: She is oriented to person, place, and time. She appears well-developed and well-nourished.  HENT:  Head: Normocephalic.  Right Ear: Tympanic membrane normal.  Left Ear: Tympanic membrane normal.  Nose: Mucosal edema present. No epistaxis. Right sinus exhibits maxillary sinus tenderness and frontal sinus tenderness. Left sinus exhibits maxillary sinus tenderness and frontal sinus tenderness.  Mouth/Throat: Uvula is midline and oropharynx is clear and moist. Mucous membranes are not dry.  Eyes: EOM are normal.  Neck: Normal range of motion.  Pulmonary/Chest: Effort normal. She has no wheezes. She  has no rales. She exhibits no tenderness.  Abdominal: She exhibits no distension.  Musculoskeletal: Normal range of motion.  Neurological: She is alert and oriented to person, place, and time.  Skin: Skin is warm and dry.  Psychiatric: She has a normal mood and affect.  Nursing note and vitals reviewed.   ED Course  Procedures (including critical care time)  DIAGNOSTIC STUDIES: Oxygen Saturation is 96% on RA, adequate by my interpretation.    COORDINATION OF CARE: 11:39 PM- Will order CXR. Discussed treatment plan with pt at bedside and  pt agreed to plan.     Labs Review Labs Reviewed - No data to display  Imaging Review Dg Chest 2 View  08/16/2015  CLINICAL DATA:  Productive cough for 1 week. EXAM: CHEST  2 VIEW COMPARISON:  02/05/2012 FINDINGS: The heart size and mediastinal contours are within normal limits. Both lungs are clear. The visualized skeletal structures are unremarkable. IMPRESSION: No active cardiopulmonary disease. Electronically Signed   By: Andreas Newport M.D.   On: 08/16/2015 23:05   I have personally reviewed and evaluated these images and lab results as part of my medical decision-making.   EKG Interpretation None      MDM   Final diagnoses:  None    1. Sinusitis  Patient with symptoms over one week and evidence sinus inflammation. Will treat with Z-pack, symptomatic measures. Encouraged PCP follow up.  I personally performed the services described in this documentation, which was scribed in my presence. The recorded information has been reviewed and is accurate.     Charlann Lange, PA-C 08/18/15 2139  Everlene Balls, MD 08/22/15 1739

## 2015-08-16 NOTE — ED Notes (Signed)
PT C/O A PRODUCTIVE COUGH WITH GREEN-YELLOW MUCUS AND CHEST TIGHTNESS X1 WEEK . DENIES FEVER.

## 2015-09-04 ENCOUNTER — Encounter (HOSPITAL_COMMUNITY): Payer: Self-pay | Admitting: *Deleted

## 2015-09-04 ENCOUNTER — Emergency Department (HOSPITAL_COMMUNITY)
Admission: EM | Admit: 2015-09-04 | Discharge: 2015-09-04 | Disposition: A | Discharge status: Home / Self Care | Payer: BLUE CROSS/BLUE SHIELD | Attending: Emergency Medicine | Admitting: Emergency Medicine

## 2015-09-04 DIAGNOSIS — I10 Essential (primary) hypertension: Secondary | ICD-10-CM | POA: Insufficient documentation

## 2015-09-04 DIAGNOSIS — Z79899 Other long term (current) drug therapy: Secondary | ICD-10-CM | POA: Diagnosis not present

## 2015-09-04 DIAGNOSIS — R0789 Other chest pain: Secondary | ICD-10-CM | POA: Diagnosis not present

## 2015-09-04 DIAGNOSIS — Z7982 Long term (current) use of aspirin: Secondary | ICD-10-CM | POA: Insufficient documentation

## 2015-09-04 DIAGNOSIS — Z87891 Personal history of nicotine dependence: Secondary | ICD-10-CM | POA: Insufficient documentation

## 2015-09-04 DIAGNOSIS — R05 Cough: Secondary | ICD-10-CM | POA: Diagnosis present

## 2015-09-04 DIAGNOSIS — J029 Acute pharyngitis, unspecified: Secondary | ICD-10-CM | POA: Diagnosis not present

## 2015-09-04 DIAGNOSIS — R059 Cough, unspecified: Secondary | ICD-10-CM

## 2015-09-04 MED ORDER — BENZONATATE 100 MG PO CAPS: 100.0000 mg | ORAL_CAPSULE | Freq: Three times a day (TID) | ORAL | Status: DC

## 2015-09-04 MED ORDER — HYDROCOD POLST-CPM POLST ER 10-8 MG/5ML PO SUER: 5.0000 mL | Freq: Every evening | ORAL | Status: DC | PRN

## 2015-09-04 NOTE — Discharge Instructions (Signed)
Please follow up with your doctor next week for re-evaluation and reconciliation of possible medication causing your cough. Return to ED for any new or worsening symptoms as we discussed.  Cough, Adult Coughing is a reflex that clears your throat and your airways. Coughing helps to heal and protect your lungs. It is normal to cough occasionally, but a cough that happens with other symptoms or lasts a long time may be a sign of a condition that needs treatment. A cough may last only 2-3 weeks (acute), or it may last longer than 8 weeks (chronic). CAUSES Coughing is commonly caused by:  Breathing in substances that irritate your lungs.  A viral or bacterial respiratory infection.  Allergies.  Asthma.  Postnasal drip.  Smoking.  Acid backing up from the stomach into the esophagus (gastroesophageal reflux).  Certain medicines.  Chronic lung problems, including COPD (or rarely, lung cancer).  Other medical conditions such as heart failure. HOME CARE INSTRUCTIONS  Pay attention to any changes in your symptoms. Take these actions to help with your discomfort:  Take medicines only as told by your health care provider.  If you were prescribed an antibiotic medicine, take it as told by your health care provider. Do not stop taking the antibiotic even if you start to feel better.  Talk with your health care provider before you take a cough suppressant medicine.  Drink enough fluid to keep your urine clear or pale yellow.  If the air is dry, use a cold steam vaporizer or humidifier in your bedroom or your home to help loosen secretions.  Avoid anything that causes you to cough at work or at home.  If your cough is worse at night, try sleeping in a semi-upright position.  Avoid cigarette smoke. If you smoke, quit smoking. If you need help quitting, ask your health care provider.  Avoid caffeine.  Avoid alcohol.  Rest as needed. SEEK MEDICAL CARE IF:   You have new  symptoms.  You cough up pus.  Your cough does not get better after 2-3 weeks, or your cough gets worse.  You cannot control your cough with suppressant medicines and you are losing sleep.  You develop pain that is getting worse or pain that is not controlled with pain medicines.  You have a fever.  You have unexplained weight loss.  You have night sweats. SEEK IMMEDIATE MEDICAL CARE IF:  You cough up blood.  You have difficulty breathing.  Your heartbeat is very fast.   This information is not intended to replace advice given to you by your health care provider. Make sure you discuss any questions you have with your health care provider.   Document Released: 03/01/2011 Document Revised: 05/24/2015 Document Reviewed: 11/09/2014 Elsevier Interactive Patient Education [employer].

## 2015-09-04 NOTE — ED Provider Notes (Signed)
CSN: [id number]     Arrival date & time 09/04/15  2026 History  By signing my name below, I, Starleen Arms, attest that this documentation has been prepared under the direction and in the presence of [employer], PA-C. Electronically Signed: Starleen Arms ED Scribe. 09/04/2015. 10:00 PM.    Chief Complaint  Patient presents with  . Cough   The history is provided by the patient. No language interpreter was used.   HPI Comments: Marie Tate is a 54 y.o. female with hx of HTN who presents to the Emergency Department complaining of a dry cough onset 1 month ago; worse at night; transiently relieved by Hycodan. Associated symptoms include chest tightness and sore throat, which are both present with cough only.  She was seen in the ED 3 weeks ago for a cough productive of green phlegm.  At that time she was dx'd with sinusitis and prescribed a Z-pack.  She denies recent travel, recent surgeries.  She denies hx of HLD, DM, asthma.  She reports a hx of cardiac related death in the mother at 68 and father at 46.  Patient denies hemoptysis, leg swelling, rhinorrhea, fever, n/v, abdominal pain.    PCP: Dr. Jeanie Cooks  Past Medical History  Diagnosis Date  . Hypertension    Past Surgical History  Procedure Laterality Date  . Uterine fibroid surgery     Family History  Problem Relation Age of Onset  . Heart attack Mother   . Stroke Father    Social History  Substance Use Topics  . Smoking status: Former Research scientist (life sciences)  . Smokeless tobacco: None  . Alcohol Use: Yes     Comment: occ   OB History    No data available     Review of Systems  10 Systems reviewed and all are negative for acute change except as noted in the HPI.  Allergies  Review of patient's allergies indicates no known allergies.  Home Medications   Prior to Admission medications   Medication Sig Start Date End Date Taking? Authorizing Provider  aspirin 325 MG tablet Take 650 mg by mouth every 4 (four) hours as needed  (pain). For pain relief    Historical Provider, MD  azithromycin (ZITHROMAX Z-PAK) 250 MG tablet Take 1 tablet (250 mg total) by mouth daily. 08/16/15   Charlann Lange, PA-C  benzonatate (TESSALON) 100 MG capsule Take 1 capsule (100 mg total) by mouth every 8 (eight) hours. 09/04/15   Comer Locket, PA-C  Biotin 1000 MCG tablet Take 1,000 mcg by mouth 3 (three) times daily.    Historical Provider, MD  chlorpheniramine-HYDROcodone (TUSSIONEX PENNKINETIC ER) 10-8 MG/5ML SUER Take 5 mLs by mouth at bedtime as needed for cough. 09/04/15   Comer Locket, PA-C  cholecalciferol (VITAMIN D) 1000 UNITS tablet Take 1,000 Units by mouth daily.    Historical Provider, MD  HYDROcodone-acetaminophen (NORCO) 5-325 MG tablet Take 1 tablet by mouth every 6 (six) hours as needed for severe pain. 06/24/15   Mercedes Camprubi-Soms, PA-C  ibuprofen (ADVIL,MOTRIN) 200 MG tablet Take 400 mg by mouth every 6 (six) hours as needed (pain).    Historical Provider, MD  lisinopril-hydrochlorothiazide (PRINZIDE,ZESTORETIC) 20-12.5 MG per tablet Take 1 tablet by mouth daily.    Historical Provider, MD   BP 134/71 mmHg  Pulse 70  Temp(Src) 98.3 F (36.8 C) (Oral)  Resp 18  SpO2 98%  LMP 08/02/2015 Physical Exam  Constitutional: She is oriented to person, place, and time. She appears well-developed and well-nourished. No distress.  HENT:  Head: Normocephalic and atraumatic.  Mouth/Throat: Oropharynx is clear and moist.  Eyes: Conjunctivae and EOM are normal.  Neck: Normal range of motion. Neck supple. No tracheal deviation present.  Cardiovascular: Normal rate, regular rhythm and normal heart sounds.   Pulmonary/Chest: Effort normal and breath sounds normal. No respiratory distress.  Musculoskeletal: Normal range of motion. She exhibits no edema.  No peripheral edema.   Lymphadenopathy:    She has no cervical adenopathy.  Neurological: She is alert and oriented to person, place, and time.  Skin: Skin is warm and  dry. She is not diaphoretic.  Psychiatric: She has a normal mood and affect. Her behavior is normal.  Nursing note and vitals reviewed.   ED Course  Procedures (including critical care time)  DIAGNOSTIC STUDIES: Oxygen Saturation is 99% on RA, normal by my interpretation.    COORDINATION OF CARE:  10:00 PM Discussed treatment plan with patient at bedside.  Patient acknowledges and agrees with plan.    Labs Review Labs Reviewed - No data to display  Imaging Review No results found. I have personally reviewed and evaluated these images and lab results as part of my medical decision-making.   EKG Interpretation None       Meds ordered this encounter  Medications  . benzonatate (TESSALON) 100 MG capsule    Sig: Take 1 capsule (100 mg total) by mouth every 8 (eight) hours.    Dispense:  21 capsule    Refill:  0    Order Specific Question:  Supervising Provider    Answer:  MILLER, BRIAN [[id number]]  . chlorpheniramine-HYDROcodone (TUSSIONEX PENNKINETIC ER) 10-8 MG/5ML SUER    Sig: Take 5 mLs by mouth at bedtime as needed for cough.    Dispense:  40 mL    Refill:  0    Order Specific Question:  Supervising Provider    Answer:  Noemi Chapel [[id number]]    Filed Vitals:   09/04/15 2030 09/04/15 2256  BP: 176/94 134/71  Pulse: 92 70  Temp: 97.9 F (36.6 C) 98.3 F (36.8 C)  Resp: 20 18   Meds given in ED:  Medications - No data to display  New Prescriptions   BENZONATATE (TESSALON) 100 MG CAPSULE    Take 1 capsule (100 mg total) by mouth every 8 (eight) hours.     MDM   Final diagnoses:  Cough   Presents for evaluation of ongoing, dry cough. No fever and has benign cardiomegaly pulmonary exam. Pt CXR 11/3 for same symptoms negative for acute infiltrate. Patients symptoms are consistent with URI, likely viral etiology. Discussed that antibiotics are not indicated for viral infections. Also discussed the possibility she may have developed cough related to her blood  pressure medication that contains lisinopril. Low wells score, doubt PE. Low suspicion for ACS, pneumothorax or other acute cardiopulmonary pathology. Pt will be discharged with symptomatic treatment.  Verbalizes understanding and is agreeable with plan. Pt is hemodynamically stable & in NAD prior to dc.  I personally performed the services described in this documentation, which was scribed in my presence. The recorded information has been reviewed and is accurate.    Comer Locket, PA-C 09/04/15 2314  Harvel Quale, MD 09/08/15 [phone]

## 2015-09-04 NOTE — ED Notes (Signed)
Pt states that she was seen on 11/22 and was diagnosed with sinusitis; pt states that she continues to have a persistent cough; pt denies productive cough; pt states that her throat and chest burn with cough and states "it feels dry and tight"; pt denies difficulty breathing; pt denies chest pain pt states that it only happens when coughs; pt states that despite using cough drops she has a constant urge to cough

## 2016-01-02 ENCOUNTER — Other Ambulatory Visit (HOSPITAL_COMMUNITY): Payer: Self-pay | Admitting: Obstetrics

## 2016-01-02 DIAGNOSIS — R102 Pelvic and perineal pain: Secondary | ICD-10-CM

## 2016-01-02 LAB — PROCEDURE REPORT - SCANNED: Pap: ABNORMAL — AB

## 2016-01-09 ENCOUNTER — Ambulatory Visit (HOSPITAL_COMMUNITY): Payer: PRIVATE HEALTH INSURANCE

## 2016-01-09 ENCOUNTER — Ambulatory Visit (HOSPITAL_COMMUNITY)
Admission: RE | Admit: 2016-01-09 | Discharge: 2016-01-09 | Disposition: A | Discharge status: Home / Self Care | Payer: BLUE CROSS/BLUE SHIELD | Source: Ambulatory Visit | Attending: Obstetrics | Admitting: Obstetrics

## 2016-01-09 DIAGNOSIS — D259 Leiomyoma of uterus, unspecified: Secondary | ICD-10-CM | POA: Diagnosis not present

## 2016-01-09 DIAGNOSIS — R102 Pelvic and perineal pain: Secondary | ICD-10-CM | POA: Diagnosis present

## 2016-03-28 ENCOUNTER — Emergency Department (HOSPITAL_COMMUNITY): Payer: BLUE CROSS/BLUE SHIELD

## 2016-03-28 ENCOUNTER — Encounter (HOSPITAL_COMMUNITY): Payer: Self-pay | Admitting: Emergency Medicine

## 2016-03-28 ENCOUNTER — Emergency Department (HOSPITAL_COMMUNITY)
Admission: EM | Admit: 2016-03-28 | Discharge: 2016-03-28 | Disposition: A | Discharge status: Home / Self Care | Payer: BLUE CROSS/BLUE SHIELD | Attending: Emergency Medicine | Admitting: Emergency Medicine

## 2016-03-28 DIAGNOSIS — R932 Abnormal findings on diagnostic imaging of liver and biliary tract: Secondary | ICD-10-CM | POA: Diagnosis not present

## 2016-03-28 DIAGNOSIS — Z87891 Personal history of nicotine dependence: Secondary | ICD-10-CM | POA: Insufficient documentation

## 2016-03-28 DIAGNOSIS — R1013 Epigastric pain: Secondary | ICD-10-CM | POA: Diagnosis not present

## 2016-03-28 DIAGNOSIS — I10 Essential (primary) hypertension: Secondary | ICD-10-CM | POA: Diagnosis not present

## 2016-03-28 DIAGNOSIS — Z7982 Long term (current) use of aspirin: Secondary | ICD-10-CM | POA: Diagnosis not present

## 2016-03-28 DIAGNOSIS — Z79899 Other long term (current) drug therapy: Secondary | ICD-10-CM | POA: Insufficient documentation

## 2016-03-28 DIAGNOSIS — K2970 Gastritis, unspecified, without bleeding: Secondary | ICD-10-CM

## 2016-03-28 DIAGNOSIS — R109 Unspecified abdominal pain: Secondary | ICD-10-CM

## 2016-03-28 DIAGNOSIS — K219 Gastro-esophageal reflux disease without esophagitis: Secondary | ICD-10-CM | POA: Diagnosis present

## 2016-03-28 LAB — BASIC METABOLIC PANEL
ANION GAP: 10 (ref 5–15)
BUN: 15 mg/dL (ref 6–20)
CALCIUM: 9.5 mg/dL (ref 8.9–10.3)
CO2: 23 mmol/L (ref 22–32)
CREATININE: 0.75 mg/dL (ref 0.44–1.00)
Chloride: 103 mmol/L (ref 101–111)
GLUCOSE: 95 mg/dL (ref 65–99)
Potassium: 3.8 mmol/L (ref 3.5–5.1)
Sodium: 136 mmol/L (ref 135–145)

## 2016-03-28 LAB — I-STAT TROPONIN, ED: TROPONIN I, POC: 0 ng/mL (ref 0.00–0.08)

## 2016-03-28 LAB — DIFFERENTIAL
BASOS ABS: 0 10*3/uL (ref 0.0–0.1)
Basophils Relative: 0 %
EOS ABS: 0.3 10*3/uL (ref 0.0–0.7)
EOS PCT: 3 %
LYMPHS PCT: 41 %
Lymphs Abs: 4.6 10*3/uL — ABNORMAL HIGH (ref 0.7–4.0)
MONOS PCT: 6 %
Monocytes Absolute: 0.7 10*3/uL (ref 0.1–1.0)
NEUTROS PCT: 50 %
Neutro Abs: 5.7 10*3/uL (ref 1.7–7.7)

## 2016-03-28 LAB — CBC
HCT: 37.4 % (ref 36.0–46.0)
HEMOGLOBIN: 12.5 g/dL (ref 12.0–15.0)
MCH: 28.9 pg (ref 26.0–34.0)
MCHC: 33.4 g/dL (ref 30.0–36.0)
MCV: 86.4 fL (ref 78.0–100.0)
PLATELETS: 338 10*3/uL (ref 150–400)
RBC: 4.33 MIL/uL (ref 3.87–5.11)
RDW: 13.7 % (ref 11.5–15.5)
WBC: 11.7 10*3/uL — ABNORMAL HIGH (ref 4.0–10.5)

## 2016-03-28 LAB — HEPATIC FUNCTION PANEL
ALT: 20 U/L (ref 14–54)
AST: 18 U/L (ref 15–41)
Albumin: 3.9 g/dL (ref 3.5–5.0)
Alkaline Phosphatase: 80 U/L (ref 38–126)
BILIRUBIN TOTAL: 0.4 mg/dL (ref 0.3–1.2)
Total Protein: 7.5 g/dL (ref 6.5–8.1)

## 2016-03-28 LAB — LIPASE, BLOOD: LIPASE: 32 U/L (ref 11–51)

## 2016-03-28 MED ORDER — IOPAMIDOL (ISOVUE-300) INJECTION 61%
INTRAVENOUS | Status: AC
  Administered 2016-03-28: 100 mL
  Filled 2016-03-28: qty 100

## 2016-03-28 MED ORDER — SUCRALFATE 1 G PO TABS: 1.0000 g | ORAL_TABLET | Freq: Four times a day (QID) | ORAL | Status: DC

## 2016-03-28 MED ORDER — GI COCKTAIL ~~LOC~~
30.0000 mL | Freq: Once | ORAL | Status: AC
  Administered 2016-03-28: 30 mL via ORAL
  Filled 2016-03-28: qty 30

## 2016-03-28 MED ORDER — HYDROCODONE-ACETAMINOPHEN 5-325 MG PO TABS: 2.0000 | ORAL_TABLET | ORAL | Status: DC | PRN

## 2016-03-28 MED ORDER — OMEPRAZOLE 20 MG PO CPDR: 20.0000 mg | DELAYED_RELEASE_CAPSULE | Freq: Two times a day (BID) | ORAL | Status: DC

## 2016-03-28 NOTE — ED Notes (Signed)
Patient arrives with complaint of epigastric discomfort for 3 days. States the pain hasn't responded to OTC antacids such as Zantac, Pepto, and Tums. Denies vomiting and nausea, but states some dizziness at times.

## 2016-03-28 NOTE — ED Notes (Signed)
Pt returned to room from CT

## 2016-03-28 NOTE — ED Provider Notes (Signed)
CSN: [id number]     Arrival date & time 03/28/16  [id number] History   First MD Initiated Contact with Patient 03/28/16 0429     Chief Complaint  Patient presents with  . Chest Pain  . Gastroesophageal Reflux     (Consider location/radiation/quality/duration/timing/severity/associated sxs/prior Treatment) Patient is a 55 y.o. female presenting with chest pain and GERD. The history is provided by the patient.  Chest Pain Gastroesophageal Reflux Associated symptoms include chest pain.  She has a history of hypertension. For the last 3 days, she has had sharp periumbilical pains and burning pain in her epigastric area. She is rated pain at 8/10. Pain is worse with eating or drinking. She has tried taking antacids and Zantac without any relief. She denies fever, chills, sweats. There is some intermittent radiation to her back. She denies nausea or vomiting. She has had some loose bowel movements but no overt diarrhea. She has not had pain like this before.  Past Medical History  Diagnosis Date  . Hypertension    Past Surgical History  Procedure Laterality Date  . Uterine fibroid surgery     Family History  Problem Relation Age of Onset  . Heart attack Mother   . Stroke Father    Social History  Substance Use Topics  . Smoking status: Former Research scientist (life sciences)  . Smokeless tobacco: None  . Alcohol Use: Yes     Comment: occ   OB History    No data available     Review of Systems  Cardiovascular: Positive for chest pain.  All other systems reviewed and are negative.     Allergies  Review of patient's allergies indicates no known allergies.  Home Medications   Prior to Admission medications   Medication Sig Start Date End Date Taking? Authorizing Provider  aspirin 325 MG tablet Take 650 mg by mouth every 4 (four) hours as needed (pain). For pain relief    Historical Provider, MD  azithromycin (ZITHROMAX Z-PAK) 250 MG tablet Take 1 tablet (250 mg total) by mouth daily. 08/16/15   Charlann Lange, PA-C  benzonatate (TESSALON) 100 MG capsule Take 1 capsule (100 mg total) by mouth every 8 (eight) hours. 09/04/15   Comer Locket, PA-C  Biotin 1000 MCG tablet Take 1,000 mcg by mouth 3 (three) times daily.    Historical Provider, MD  chlorpheniramine-HYDROcodone (TUSSIONEX PENNKINETIC ER) 10-8 MG/5ML SUER Take 5 mLs by mouth at bedtime as needed for cough. 09/04/15   Comer Locket, PA-C  cholecalciferol (VITAMIN D) 1000 UNITS tablet Take 1,000 Units by mouth daily.    Historical Provider, MD  HYDROcodone-acetaminophen (NORCO) 5-325 MG tablet Take 1 tablet by mouth every 6 (six) hours as needed for severe pain. 06/24/15   Mercedes Camprubi-Soms, PA-C  ibuprofen (ADVIL,MOTRIN) 200 MG tablet Take 400 mg by mouth every 6 (six) hours as needed (pain).    Historical Provider, MD  lisinopril-hydrochlorothiazide (PRINZIDE,ZESTORETIC) 20-12.5 MG per tablet Take 1 tablet by mouth daily.    Historical Provider, MD   BP 145/79 mmHg  Pulse 72  Temp(Src) 97.6 F (36.4 C) (Oral)  Resp 21  Ht 5\' 3"  (1.6 m)  Wt 212 lb (96.163 kg)  BMI 37.56 kg/m2  SpO2 100%  LMP 01/14/2016 (Exact Date) Physical Exam  Nursing note and vitals reviewed.  55 year old female, resting comfortably and in no acute distress. Vital signs are significant for hypertension. Oxygen saturation is 100%, which is normal. Head is normocephalic and atraumatic. PERRLA, EOMI. Oropharynx is clear. Neck is  nontender and supple without adenopathy or JVD. Back is nontender and there is no CVA tenderness. Lungs are clear without rales, wheezes, or rhonchi. Chest is nontender. Heart has regular rate and rhythm without murmur. Abdomen is soft, flat, with mild to moderate epigastric and right upper quadrant tenderness. There is positive Murphy sign. There are no masses or hepatosplenomegaly and peristalsis is normoactive. Extremities have no cyanosis or edema, full range of motion is present. Skin is warm and dry without  rash. Neurologic: Mental status is normal, cranial nerves are intact, there are no motor or sensory deficits.  ED Course  Procedures (including critical care time) Labs Review Results for orders placed or performed during the hospital encounter of [id number]  Basic metabolic panel  Result Value Ref Range   Sodium 136 135 - 145 mmol/L   Potassium 3.8 3.5 - 5.1 mmol/L   Chloride 103 101 - 111 mmol/L   CO2 23 22 - 32 mmol/L   Glucose, Bld 95 65 - 99 mg/dL   BUN 15 6 - 20 mg/dL   Creatinine, Ser 0.75 0.44 - 1.00 mg/dL   Calcium 9.5 8.9 - 10.3 mg/dL   GFR calc non Af Amer >60 >60 mL/min   GFR calc Af Amer >60 >60 mL/min   Anion gap 10 5 - 15  CBC  Result Value Ref Range   WBC 11.7 (H) 4.0 - 10.5 K/uL   RBC 4.33 3.87 - 5.11 MIL/uL   Hemoglobin 12.5 12.0 - 15.0 g/dL   HCT 37.4 36.0 - 46.0 %   MCV 86.4 78.0 - 100.0 fL   MCH 28.9 26.0 - 34.0 pg   MCHC 33.4 30.0 - 36.0 g/dL   RDW 13.7 11.5 - 15.5 %   Platelets 338 150 - 400 K/uL  Differential  Result Value Ref Range   Neutro Abs PENDING 1.7 - 7.7 K/uL   Lymphs Abs PENDING 0.7 - 4.0 K/uL   Monocytes Absolute PENDING 0.1 - 1.0 K/uL   Eosinophils Absolute PENDING 0.0 - 0.7 K/uL   Basophils Absolute PENDING 0.0 - 0.1 K/uL   Neutrophils Relative % 50 %   Lymphocytes Relative 41 %   Monocytes Relative 6 %   Eosinophils Relative 3 %   Basophils Relative 0 %   WBC Morphology ATYPICAL LYMPHOCYTES   Hepatic function panel  Result Value Ref Range   Total Protein 7.5 6.5 - 8.1 g/dL   Albumin 3.9 3.5 - 5.0 g/dL   AST 18 15 - 41 U/L   ALT 20 14 - 54 U/L   Alkaline Phosphatase 80 38 - 126 U/L   Total Bilirubin 0.4 0.3 - 1.2 mg/dL   Bilirubin, Direct <0.1 (L) 0.1 - 0.5 mg/dL   Indirect Bilirubin NOT CALCULATED 0.3 - 0.9 mg/dL  Lipase, blood  Result Value Ref Range   Lipase 32 11 - 51 U/L  I-stat troponin, ED  Result Value Ref Range   Troponin i, poc 0.00 0.00 - 0.08 ng/mL   Comment 3           Imaging Review Dg Chest 2  View  03/28/2016  CLINICAL DATA:  Epigastric and chest pain for 2 days. EXAM: CHEST  2 VIEW COMPARISON:  08/16/2015 FINDINGS: Normal heart size and pulmonary vascularity. No focal airspace disease or consolidation in the lungs. No blunting of costophrenic angles. No pneumothorax. Mediastinal contours appear intact. Small cervical rib on the left. IMPRESSION: No active cardiopulmonary disease. Electronically Signed   By: Oren Beckmann.D.  On: 03/28/2016 04:02   US Abdomen Complete  03/28/2016  CLINICAL DATA:  Epigastric discomfort for 3 days. No nausea and vomiting. History of hypertension. EXAM: ABDOMEN ULTRASOUND COMPLETE COMPARISON:  None. FINDINGS: Gallbladder: No gallstones or wall thickening visualized. No sonographic Murphy sign noted by sonographer. Common bile duct: Diameter: 2 mm, normal Liver: Diffusely increased hepatic parenchymal echotexture, likely fatty infiltration. There is a circumscribed area of decreased echogenicity relative to surrounding liver localized adjacent to the gallbladder fossa. This area measures about 4.1 cm maximal diameter. Although focal fatty sparing often occurs in this area, the lesion has a lobular appearance and is well defined suggesting a mass. Recommend follow-up with CT for further evaluation. IVC: Normal caliber. Pancreas: Limited visualization due to overlying bowel gas. Visualized portions of the body appear intact. Spleen: Size and appearance within normal limits. Right Kidney: Length: 11.8 cm. Echogenicity within normal limits. No mass or hydronephrosis visualized. Left Kidney: Length: 11.8 cm. Echogenicity within normal limits. No mass or hydronephrosis visualized. Abdominal aorta: No aneurysm visualized. Other findings: None. IMPRESSION: Diffuse increased parenchymal echotexture throughout the liver suggesting fatty infiltration. Focal lobular hypoechoic region adjacent to the gallbladder fossa may represent a mass lesion and CT is recommended for  further evaluation. Electronically Signed   By: Lucienne Capers M.D.   On: 03/28/2016 06:04   I have personally reviewed and evaluated these images and lab results as part of my medical decision-making.   EKG Interpretation   Date/Time:  Thursday March 28 2016 03:30:31 EDT Ventricular Rate:  78 PR Interval:  148 QRS Duration: 80 QT Interval:  394 QTC Calculation: 449 R Axis:   48 Text Interpretation:  Normal sinus rhythm with sinus arrhythmia Normal ECG  When compared with ECG of 12/06/2011, No significant change was found  Confirmed by [facility]  MD, Mali Eppard ([id number]) on 03/28/2016 4:17:43 AM      MDM   Final diagnoses:  Abdominal pain, unspecified abdominal location    Abdominal pain that would be consistent with gastritis, gastric ulcer, biliary colic. Since she has failed to respond to antacids as an outpatient, will send for gallbladder ultrasound. Old records are reviewed, and she has no relevant past visits.  Laboratory workup is unremarkable. Ultrasound shows no evidence of cholelithiasis, but fatty liver is noted. Possible mass is noted adjacent to the gallbladder fossa. She is sent for CT scan. She has been given a dose of GI cocktail which gave partial relief. She will likely need gastroenterology follow-up. Case is signed out to Dr. Jeneen Rinks.  Delora Fuel, MD [id number] [phone]

## 2016-03-28 NOTE — Discharge Instructions (Signed)
Your symptoms today are very likely from a gastritis. This is related to acid production in your stomach. Your CAT scan of your liver is abnormal. This may be simply an overgrowth of normal liver tissue. However, it is recommended that you follow-up with your primary care physician to discuss an outpatient liver MRI.    Abdominal Pain, Adult Many things can cause abdominal pain. Usually, abdominal pain is not caused by a disease and will improve without treatment. It can often be observed and treated at home. Your health care provider will do a physical exam and possibly order blood tests and X-rays to help determine the seriousness of your pain. However, in many cases, more time must pass before a clear cause of the pain can be found. Before that point, your health care provider may not know if you need more testing or further treatment. HOME CARE INSTRUCTIONS Monitor your abdominal pain for any changes. The following actions may help to alleviate any discomfort you are experiencing:  Only take over-the-counter or prescription medicines as directed by your health care provider.  Do not take laxatives unless directed to do so by your health care provider.  Try a clear liquid diet (broth, tea, or water) as directed by your health care provider. Slowly move to a bland diet as tolerated. SEEK MEDICAL CARE IF:  You have unexplained abdominal pain.  You have abdominal pain associated with nausea or diarrhea.  You have pain when you urinate or have a bowel movement.  You experience abdominal pain that wakes you in the night.  You have abdominal pain that is worsened or improved by eating food.  You have abdominal pain that is worsened with eating fatty foods.  You have a fever. SEEK IMMEDIATE MEDICAL CARE IF:  Your pain does not go away within 2 hours.  You keep throwing up (vomiting).  Your pain is felt only in portions of the abdomen, such as the right side or the left lower  portion of the abdomen.  You pass bloody or black tarry stools. MAKE SURE YOU:  Understand these instructions.  Will watch your condition.  Will get help right away if you are not doing well or get worse.   This information is not intended to replace advice given to you by your health care provider. Make sure you discuss any questions you have with your health care provider.   Document Released: 06/12/2005 Document Revised: 05/24/2015 Document Reviewed: 05/12/2013 Elsevier Interactive Patient Education 2016 Elsevier Inc[employer]  Gastritis, Adult Gastritis is soreness and puffiness (inflammation) of the lining of the stomach. If you do not get help, gastritis can cause bleeding and sores (ulcers) in the stomach. HOME CARE   Only take medicine as told by your doctor.  If you were given antibiotic medicines, take them as told. Finish the medicines even if you start to feel better.  Drink enough fluids to keep your pee (urine) clear or pale yellow.  Avoid foods and drinks that make your problems worse. Foods you may want to avoid include:  Caffeine or alcohol.  Chocolate.  Mint.  Garlic and onions.  Spicy foods.  Citrus fruits, including oranges, lemons, or limes.  Food containing tomatoes, including sauce, chili, salsa, and pizza.  Fried and fatty foods.  Eat small meals throughout the day instead of large meals. GET HELP RIGHT AWAY IF:   You have black or dark red poop (stools).  You throw up (vomit) blood. It may look like coffee grounds.  You  cannot keep fluids down.  Your belly (abdominal) pain gets worse.  You have a fever.  You do not feel better after 1 week.  You have any other questions or concerns. MAKE SURE YOU:   Understand these instructions.  Will watch your condition.  Will get help right away if you are not doing well or get worse.   This information is not intended to replace advice given to you by your health care provider. Make sure you  discuss any questions you have with your health care provider.   Document Released: 02/19/2008 Document Revised: 11/25/2011 Document Reviewed: 10/16/2011 Elsevier Interactive Patient Education [employer].

## 2016-03-28 NOTE — ED Notes (Signed)
Pt is in stable condition upon d/c and ambulates from ED. 

## 2016-03-28 NOTE — ED Notes (Signed)
Dr. Glick at bedside.  

## 2016-03-28 NOTE — ED Notes (Signed)
Pt being transported to CT

## 2016-03-28 NOTE — ED Notes (Signed)
Patient transported to Ultrasound 

## 2016-03-28 NOTE — ED Provider Notes (Signed)
Patient seen and evaluated. Previously discussed with Dr. Roxanne Mins. Patient's care assumed. I discussed her ultrasound, and CT findings with her. Per radiology this shows abnormal tissue on the anterior aspect of the liver near the gallbladder. Thought to perhaps be febrile hyperplasia. Outpatient MRI recommended. I discussed this with the patient. Discussed the possibility, although unlikely, this being the liver malignancy. I recommended she contact her primary care physician to schedule MRI. This was placed in writing her discharge instructions. Otherwise, feel her symptoms are unrelated to this abnormality. Likely gastritis. Plan proton pump inhibitor, Carafate, limited amount of pain medication. Avoid alcohol tobacco caffeine anti-inflammatories and large meals. Primary care follow-up.  Tanna Furry, MD 03/28/16 [phone]

## 2016-03-31 ENCOUNTER — Emergency Department (HOSPITAL_COMMUNITY)
Admission: EM | Admit: 2016-03-31 | Discharge: 2016-03-31 | Disposition: A | Discharge status: Home / Self Care | Payer: BLUE CROSS/BLUE SHIELD | Attending: Emergency Medicine | Admitting: Emergency Medicine

## 2016-03-31 ENCOUNTER — Encounter (HOSPITAL_COMMUNITY): Payer: Self-pay | Admitting: Emergency Medicine

## 2016-03-31 ENCOUNTER — Emergency Department (HOSPITAL_COMMUNITY): Payer: BLUE CROSS/BLUE SHIELD

## 2016-03-31 DIAGNOSIS — Z79899 Other long term (current) drug therapy: Secondary | ICD-10-CM | POA: Insufficient documentation

## 2016-03-31 DIAGNOSIS — I10 Essential (primary) hypertension: Secondary | ICD-10-CM | POA: Diagnosis not present

## 2016-03-31 DIAGNOSIS — R1011 Right upper quadrant pain: Secondary | ICD-10-CM

## 2016-03-31 DIAGNOSIS — Z7982 Long term (current) use of aspirin: Secondary | ICD-10-CM | POA: Insufficient documentation

## 2016-03-31 DIAGNOSIS — Z87891 Personal history of nicotine dependence: Secondary | ICD-10-CM | POA: Diagnosis not present

## 2016-03-31 LAB — URINALYSIS, ROUTINE W REFLEX MICROSCOPIC
Bilirubin Urine: NEGATIVE
GLUCOSE, UA: NEGATIVE mg/dL
Hgb urine dipstick: NEGATIVE
Ketones, ur: NEGATIVE mg/dL
Nitrite: NEGATIVE
PH: 5.5 (ref 5.0–8.0)
Protein, ur: NEGATIVE mg/dL
Specific Gravity, Urine: 1.017 (ref 1.005–1.030)

## 2016-03-31 LAB — CBC WITH DIFFERENTIAL/PLATELET
BASOS PCT: 0 %
Basophils Absolute: 0 10*3/uL (ref 0.0–0.1)
Eosinophils Absolute: 0.2 10*3/uL (ref 0.0–0.7)
Eosinophils Relative: 1 %
HEMATOCRIT: 38 % (ref 36.0–46.0)
Hemoglobin: 12.7 g/dL (ref 12.0–15.0)
Lymphocytes Relative: 33 %
Lymphs Abs: 3.8 10*3/uL (ref 0.7–4.0)
MCH: 28.7 pg (ref 26.0–34.0)
MCHC: 33.4 g/dL (ref 30.0–36.0)
MCV: 86 fL (ref 78.0–100.0)
MONO ABS: 1.1 10*3/uL — AB (ref 0.1–1.0)
Monocytes Relative: 9 %
NEUTROS ABS: 6.7 10*3/uL (ref 1.7–7.7)
NEUTROS PCT: 57 %
Platelets: 353 10*3/uL (ref 150–400)
RBC: 4.42 MIL/uL (ref 3.87–5.11)
RDW: 13.7 % (ref 11.5–15.5)
WBC: 11.7 10*3/uL — AB (ref 4.0–10.5)

## 2016-03-31 LAB — COMPREHENSIVE METABOLIC PANEL
ALBUMIN: 3.8 g/dL (ref 3.5–5.0)
ALT: 20 U/L (ref 14–54)
AST: 27 U/L (ref 15–41)
Alkaline Phosphatase: 80 U/L (ref 38–126)
Anion gap: 11 (ref 5–15)
BILIRUBIN TOTAL: 1.4 mg/dL — AB (ref 0.3–1.2)
BUN: 11 mg/dL (ref 6–20)
CHLORIDE: 100 mmol/L — AB (ref 101–111)
CO2: 26 mmol/L (ref 22–32)
Calcium: 9.7 mg/dL (ref 8.9–10.3)
Creatinine, Ser: 0.84 mg/dL (ref 0.44–1.00)
GFR calc Af Amer: >60 mL/min (ref >60)
GFR calc non Af Amer: >60 mL/min (ref >60)
GLUCOSE: 105 mg/dL — AB (ref 65–99)
POTASSIUM: 4.1 mmol/L (ref 3.5–5.1)
Sodium: 137 mmol/L (ref 135–145)
TOTAL PROTEIN: 7.2 g/dL (ref 6.5–8.1)

## 2016-03-31 LAB — URINE MICROSCOPIC-ADD ON

## 2016-03-31 LAB — POC URINE PREG, ED: Preg Test, Ur: NEGATIVE

## 2016-03-31 LAB — LIPASE, BLOOD: Lipase: 33 U/L (ref 11–51)

## 2016-03-31 LAB — I-STAT CG4 LACTIC ACID, ED: Lactic Acid, Venous: 1.05 mmol/L (ref 0.5–1.9)

## 2016-03-31 MED ORDER — SODIUM CHLORIDE 0.9 % IV BOLUS (SEPSIS)
1000.0000 mL | Freq: Once | INTRAVENOUS | Status: AC
  Administered 2016-03-31: 1000 mL via INTRAVENOUS

## 2016-03-31 MED ORDER — KETOROLAC TROMETHAMINE 30 MG/ML IJ SOLN
30.0000 mg | Freq: Once | INTRAMUSCULAR | Status: AC
  Administered 2016-03-31: 30 mg via INTRAVENOUS
  Filled 2016-03-31: qty 1

## 2016-03-31 MED ORDER — MORPHINE SULFATE (PF) 4 MG/ML IV SOLN
4.0000 mg | Freq: Once | INTRAVENOUS | Status: AC
  Administered 2016-03-31: 4 mg via INTRAVENOUS
  Filled 2016-03-31: qty 1

## 2016-03-31 NOTE — Discharge Instructions (Signed)
Abdominal Pain, Adult Marie Tate, see your primary care doctor to have an MRI for evaluation of your liver. If any symptoms worsen, come back to emergency department immediately. Thank you. Many things can cause belly (abdominal) pain. Most times, the belly pain is not dangerous. Many cases of belly pain can be watched and treated at home. HOME CARE   Do not take medicines that help you go poop (laxatives) unless told to by your doctor.  Only take medicine as told by your doctor.  Eat or drink as told by your doctor. Your doctor will tell you if you should be on a special diet. GET HELP IF:  You do not know what is causing your belly pain.  You have belly pain while you are sick to your stomach (nauseous) or have runny poop (diarrhea).  You have pain while you pee or poop.  Your belly pain wakes you up at night.  You have belly pain that gets worse or better when you eat.  You have belly pain that gets worse when you eat fatty foods.  You have a fever. GET HELP RIGHT AWAY IF:   The pain does not go away within 2 hours.  You keep throwing up (vomiting).  The pain changes and is only in the right or left part of the belly.  You have bloody or tarry looking poop. MAKE SURE YOU:   Understand these instructions.  Will watch your condition.  Will get help right away if you are not doing well or get worse.   This information is not intended to replace advice given to you by your health care provider. Make sure you discuss any questions you have with your health care provider.   Document Released: 02/19/2008 Document Revised: 09/23/2014 Document Reviewed: 05/12/2013 Elsevier Interactive Patient Education [employer].

## 2016-03-31 NOTE — ED Notes (Signed)
Pt in reports that her "liver feels swollen" pt was seen on 7/13 and dx with gastritis, also had CT scan which showed abnormality with liver and was supposed to have MRI outpatientt scheduled. Denies pain, denies N/V/D

## 2016-03-31 NOTE — ED Provider Notes (Signed)
CSN: [id number]     Arrival date & time 03/31/16  0436 History   First MD Initiated Contact with Patient 03/31/16 [phone]     Chief Complaint  Patient presents with  . Abdominal Pain     (Consider location/radiation/quality/duration/timing/severity/associated sxs/prior Treatment) HPI   Marie Tate is a 55 y.o. female with no sig PMH, here with RUQ abd pain.  Patient states this is a new pain from her prior visit to the ED a few days ago.  That felt like gastritis, but today she feels like her liver is swollen and she has bloating.  She is suppose to have follow up to have an MRI of her liver but has not arranged that yet.  Her previous US showed a possible mass in the liver. She denies N/V/D.  She took her home norco without any relief.  Her urination has been normal. She has not had any fevers. There are no further complaints.   10 Systems reviewed and are negative for acute change except as noted in the HPI.    Past Medical History  Diagnosis Date  . Hypertension    Past Surgical History  Procedure Laterality Date  . Uterine fibroid surgery     Family History  Problem Relation Age of Onset  . Heart attack Mother   . Stroke Father    Social History  Substance Use Topics  . Smoking status: Former Research scientist (life sciences)  . Smokeless tobacco: None  . Alcohol Use: Yes     Comment: occ   OB History    No data available     Review of Systems    Allergies  Review of patient's allergies indicates no known allergies.  Home Medications   Prior to Admission medications   Medication Sig Start Date End Date Taking? Authorizing Provider  aspirin 325 MG tablet Take 650 mg by mouth every 4 (four) hours as needed (pain). For pain relief    Historical Provider, MD  azithromycin (ZITHROMAX Z-PAK) 250 MG tablet Take 1 tablet (250 mg total) by mouth daily. Patient not taking: Reported on 03/28/2016 08/16/15   Charlann Lange, PA-C  benzonatate (TESSALON) 100 MG capsule Take 1 capsule (100 mg total) by  mouth every 8 (eight) hours. Patient not taking: Reported on 03/28/2016 09/04/15   Comer Locket, PA-C  chlorpheniramine-HYDROcodone [facility]) 10-8 MG/5ML SUER Take 5 mLs by mouth at bedtime as needed for cough. Patient not taking: Reported on 03/28/2016 09/04/15   Comer Locket, PA-C  cholecalciferol (VITAMIN D) 1000 UNITS tablet Take 1,000 Units by mouth daily.    Historical Provider, MD  HYDROcodone-acetaminophen (NORCO/VICODIN) 5-325 MG tablet Take 2 tablets by mouth every 4 (four) hours as needed. 03/28/16   Tanna Furry, MD  lisinopril-hydrochlorothiazide (PRINZIDE,ZESTORETIC) 10-12.5 MG tablet Take 1 tablet by mouth daily. 02/27/16   Historical Provider, MD  Magnesium 250 MG TABS Take 250 mg by mouth daily.    Historical Provider, MD  omeprazole (PRILOSEC) 20 MG capsule Take 1 capsule (20 mg total) by mouth 2 (two) times daily. 03/28/16   Tanna Furry, MD  sucralfate (CARAFATE) 1 g tablet Take 1 tablet (1 g total) by mouth 4 (four) times daily. 03/28/16   Tanna Furry, MD   BP 156/85 mmHg  Pulse 86  Temp(Src) 97.9 F (36.6 C) (Oral)  Resp 18  SpO2 100%  LMP 01/14/2016 (Exact Date) Physical Exam  Constitutional: She is oriented to person, place, and time. She appears well-developed and well-nourished. No distress.  HENT:  Head:  Normocephalic and atraumatic.  Nose: Nose normal.  Mouth/Throat: Oropharynx is clear and moist. No oropharyngeal exudate.  Eyes: Conjunctivae and EOM are normal. Pupils are equal, round, and reactive to light. No scleral icterus.  Neck: Normal range of motion. Neck supple. No JVD present. No tracheal deviation present. No thyromegaly present.  Cardiovascular: Normal rate, regular rhythm and normal heart sounds.  Exam reveals no gallop and no friction rub.   No murmur heard. Pulmonary/Chest: Effort normal and breath sounds normal. No respiratory distress. She has no wheezes. She exhibits no tenderness.  Abdominal: Soft. Bowel sounds are normal. She  exhibits no distension and no mass. There is no tenderness. There is no rebound and no guarding.  Musculoskeletal: Normal range of motion. She exhibits no edema or tenderness.  Lymphadenopathy:    She has no cervical adenopathy.  Neurological: She is alert and oriented to person, place, and time. No cranial nerve deficit. She exhibits normal muscle tone.  Skin: Skin is warm and dry. No rash noted. No erythema. No pallor.  Nursing note and vitals reviewed.   ED Course  Procedures (including critical care time) Labs Review Labs Reviewed  CBC WITH DIFFERENTIAL/PLATELET - Abnormal; Notable for the following:    WBC 11.7 (*)    Monocytes Absolute 1.1 (*)    All other components within normal limits  COMPREHENSIVE METABOLIC PANEL - Abnormal; Notable for the following:    Chloride 100 (*)    Glucose, Bld 105 (*)    Total Bilirubin 1.4 (*)    All other components within normal limits  URINALYSIS, ROUTINE W REFLEX MICROSCOPIC (NOT AT [facility]) - Abnormal; Notable for the following:    APPearance CLOUDY (*)    Leukocytes, UA SMALL (*)    All other components within normal limits  URINE MICROSCOPIC-ADD ON - Abnormal; Notable for the following:    Squamous Epithelial / LPF 6-30 (*)    Bacteria, UA MANY (*)    All other components within normal limits  LIPASE, BLOOD  I-STAT CG4 LACTIC ACID, ED  POC URINE PREG, ED    Imaging Review US Abdomen Limited Ruq  03/31/2016  CLINICAL DATA:  Right upper quadrant abdominal pain. EXAM: US ABDOMEN LIMITED - RIGHT UPPER QUADRANT COMPARISON:  CT and ultrasound exam of March 28, 2016. FINDINGS: Gallbladder: No gallstones or wall thickening visualized. No sonographic Murphy sign noted by sonographer. Common bile duct: Diameter: 5.2 mm which is within normal limits. Liver: Increased echogenicity is noted throughout most of the hepatic parenchyma consistent with fatty infiltration. As noted on prior studies, hypoechoic area is noted in the in hepatic parenchyma  adjacent to gallbladder fossa that measures 5.0 x 4.9 x 2.9 cm. IMPRESSION: No gallstones or evidence of biliary dilatation is noted. Increased echogenicity is noted throughout most hepatic parenchyma consistent with fatty infiltration. Continued presence of hypoechoic area adjacent to gallbladder fossa that measures 5.0 x 4.9 x 2.9 cm that may simply represent fatty infiltration, but nonemergent MRI of the liver with and without gadolinium is recommended to rule out other pathology or neoplasm. Electronically Signed   By: Marijo Conception, M.D.   On: 03/31/2016 07:15   I have personally reviewed and evaluated these images and lab results as part of my medical decision-making.   EKG Interpretation None      MDM   Final diagnoses:  RUQ abdominal pain    Patient presents to the ED for RUQ abdominal pain. Her recent studies show concern for possible mass.  Now  her bili is elevated.  Will repeat US for evaluation for any obstruction.     7:28 AM pain is now weell controlled.  [place] does not reveal any obstruction despite elevate bili.  Patient again advised to see PCP for MRI study.  She demonstrates good understanding.  Has pain medication at home. She appears well and in NAD. VS remain within her normal limits and she is safe for DC.   Everlene Balls, MD 03/31/16 [phone]

## 2016-04-02 ENCOUNTER — Ambulatory Visit: Payer: BLUE CROSS/BLUE SHIELD

## 2016-04-03 ENCOUNTER — Ambulatory Visit (INDEPENDENT_AMBULATORY_CARE_PROVIDER_SITE_OTHER): Payer: BLUE CROSS/BLUE SHIELD | Admitting: Urgent Care

## 2016-04-03 VITALS — BP 146/90 | HR 95 | Temp 98.3°F | Resp 18 | Ht 65.35 in | Wt 220.0 lb

## 2016-04-03 DIAGNOSIS — I10 Essential (primary) hypertension: Secondary | ICD-10-CM | POA: Diagnosis not present

## 2016-04-03 DIAGNOSIS — K760 Fatty (change of) liver, not elsewhere classified: Secondary | ICD-10-CM

## 2016-04-03 DIAGNOSIS — IMO0001 Reserved for inherently not codable concepts without codable children: Secondary | ICD-10-CM

## 2016-04-03 DIAGNOSIS — K449 Diaphragmatic hernia without obstruction or gangrene: Secondary | ICD-10-CM | POA: Diagnosis not present

## 2016-04-03 DIAGNOSIS — R030 Elevated blood-pressure reading, without diagnosis of hypertension: Secondary | ICD-10-CM

## 2016-04-03 DIAGNOSIS — K769 Liver disease, unspecified: Secondary | ICD-10-CM

## 2016-04-03 DIAGNOSIS — K7689 Other specified diseases of liver: Secondary | ICD-10-CM | POA: Diagnosis not present

## 2016-04-03 DIAGNOSIS — R1084 Generalized abdominal pain: Secondary | ICD-10-CM | POA: Diagnosis not present

## 2016-04-03 LAB — CBC
HCT: 37 % (ref 35.0–45.0)
Hemoglobin: 12.5 g/dL (ref 11.7–15.5)
MCH: 28.3 pg (ref 27.0–33.0)
MCHC: 33.8 g/dL (ref 32.0–36.0)
MCV: 83.7 fL (ref 80.0–100.0)
MPV: 10.8 fL (ref 7.5–12.5)
PLATELETS: 390 10*3/uL (ref 140–400)
RBC: 4.42 MIL/uL (ref 3.80–5.10)
RDW: 14.1 % (ref 11.0–15.0)
WBC: 7.8 10*3/uL (ref 3.8–10.8)

## 2016-04-03 NOTE — Progress Notes (Signed)
    MRN: [id number] DOB: March 30, 1961  Subjective:   Marie Tate is a 55 y.o. female presenting for Hospitalization Follow-up  Patient was seen at [facility] ED on 03/28/2016 for belly pain, incidental findings showed a liver lesion with recommendations for non-emergent MRI. She was thought to have gastritis regarding her belly pain and plan included PPI, Carafate, pain medication. She was advised to limit alcohol, caffeine, tobacco and NSAIDs. LFTs were normal on 03/31/2016, all other labs were generally unremarkable. Today, patient reports that her belly pain is somewhat improved. She has been using omeprazole and sucralfate. She is having some loose stools. Denies fever, nausea, vomiting, bloody stools, weight loss. Admits that she has had night sweats for the past 6 months, was prescribed clonidine for night sweats by her PCP. She has not been back to them due to debt with that clinic. Stopped smoking in 2010, smoked less than 1 ppd for 5 years. Prior to CT imaging, patient was drinking 1-2 glasses of wine per month.  Shiran has a current medication list which includes the following prescription(s): aspirin, cholecalciferol, hydrocodone-acetaminophen, lisinopril-hydrochlorothiazide, magnesium, omeprazole, sucralfate, azithromycin, benzonatate, and chlorpheniramine-hydrocodone. Also has No Known Allergies.  Brizeida  has a past medical history of Hypertension. Also  has past surgical history that includes Uterine fibroid surgery.  Her family history includes Heart attack in her mother; Stroke in her father.   Objective:   Vitals: BP 146/90 mmHg  Pulse 95  Temp(Src) 98.3 F (36.8 C)  Resp 18  Ht 5' 5.35" (1.66 m)  Wt 220 lb (99.791 kg)  BMI 36.21 kg/m2  SpO2 100%  LMP 01/14/2016 (Exact Date)  Wt Readings from Last 3 Encounters:  04/03/16 220 lb (99.791 kg)  03/28/16 212 lb (96.163 kg)  08/16/15 216 lb 6.4 oz (98.158 kg)    Physical Exam  Constitutional: She is oriented to person,  place, and time. She appears well-developed and well-nourished.  HENT:  Mouth/Throat: Oropharynx is clear and moist.  Cardiovascular: Normal rate, regular rhythm and intact distal pulses.  Exam reveals no gallop and no friction rub.   No murmur heard. Pulmonary/Chest: No respiratory distress. She has no wheezes. She has no rales.  Abdominal: Soft. Bowel sounds are normal. She exhibits no distension and no mass.  Neurological: She is alert and oriented to person, place, and time.  Skin: Skin is warm and dry.   Assessment and Plan :   1. Hiatal hernia 2. Generalized abdominal pain 3. Fatty liver 4. Liver lesion - MRI and labs pending, consider referral to GI or General Surgery for further management. F/u in 1 month.  5. Essential hypertension 6. Elevated blood pressure - Admits dietary non-compliance, will try to eat healthier. Continue current BP regimen, recheck in 1 month.  Jaynee Eagles, PA-C [facility] [phone] 04/03/2016 2:44 PM

## 2016-04-03 NOTE — Patient Instructions (Addendum)
Fatty Liver Fatty liver, also called hepatic steatosis or steatohepatitis, is a condition in which too much fat has built up in your liver cells. The liver removes harmful substances from your bloodstream. It produces fluids your body needs. It also helps your body use and store energy from the food you eat. In many cases, fatty liver does not cause symptoms or problems. It is often diagnosed when tests are being done for other reasons. However, over time, fatty liver can cause inflammation that may lead to more serious liver problems, such as scarring of the liver (cirrhosis). CAUSES  Causes of fatty liver may include:   Drinking too much alcohol.  Poor nutrition.  Obesity.  Cushing syndrome.  Diabetes.  Hyperlipidemia.  Pregnancy.  Certain drugs.  Poisons.  Some viral infections. RISK FACTORS You may be more likely to develop fatty liver if you:  Abuse alcohol.  Are pregnant.  Are overweight.  Have diabetes.  Have hepatitis.  Have a high triglyceride level.  SIGNS AND SYMPTOMS  Fatty liver often does not cause any symptoms. In cases where symptoms develop, they can include:  Fatigue.  Weakness.  Weight loss.  Confusion.   Abdominal pain.  Yellowing of your skin and the white parts of your eyes (jaundice).  Nausea and vomiting. DIAGNOSIS  Fatty liver may be diagnosed by:   Physical exam and medical history.  Blood tests.  Imaging tests, such as an ultrasound, CT scan, or MRI.  Liver biopsy. A small sample of liver tissue is removed using a needle. The sample is then looked at under a microscope. TREATMENT  Fatty liver is often caused by other health conditions. Treatment for fatty liver may involve medicines and lifestyle changes to manage conditions such as:   Alcoholism.  High cholesterol.  Diabetes.  Being overweight or obese.  HOME CARE INSTRUCTIONS  Eat a healthy diet as directed by your health care provider.  Exercise regularly.  This can help you lose weight and control your cholesterol and diabetes. Talk to your health care provider about an exercise plan and which activities are best for you.  Do not drink alcohol.   Take medicines only as directed by your health care provider. SEEK MEDICAL CARE IF: You have difficulty controlling your:  Blood sugar.  Cholesterol.  Alcohol consumption. SEEK IMMEDIATE MEDICAL CARE IF:  You have abdominal pain.  You have jaundice.  You have nausea and vomiting.   This information is not intended to replace advice given to you by your health care provider. Make sure you discuss any questions you have with your health care provider.   Document Released: 10/18/2005 Document Revised: 09/23/2014 Document Reviewed: 01/12/2014 Elsevier Interactive Patient Education 2016 Elsevier Inc.    Nonalcoholic Fatty Liver Disease Diet Nonalcoholic fatty liver disease is a condition that causes fat to accumulate in and around the liver. The disease makes it harder for the liver to work the way that it should. Following a healthy diet can help to keep nonalcoholic fatty liver disease under control. It can also help to prevent or improve conditions that are associated with the disease, such as heart disease, diabetes, high blood pressure, and abnormal cholesterol levels. Along with regular exercise, this diet:  Promotes weight loss.  Helps to control blood sugar levels.  Helps to improve the way that the body uses insulin. WHAT DO I NEED TO KNOW ABOUT THIS DIET?  Use the glycemic index (GI) to plan your meals. The index tells you how quickly a food  will raise your blood sugar. Choose low-GI foods. These foods take a longer time to raise blood sugar.  Keep track of how many calories you take in. Eating the right amount of calories will help you to achieve a healthy weight.  You may want to follow a Mediterranean diet. This diet includes a lot of vegetables, lean meats or fish, whole  grains, fruits, and healthy oils and fats. WHAT FOODS CAN I EAT? Grains Whole grains, such as whole-wheat or whole-grain breads, crackers, tortillas, cereals, and pasta. Stone-ground whole wheat. Pumpernickel bread. Unsweetened oatmeal. Bulgur. Barley. Quinoa. Brown or wild rice. Corn or whole-wheat flour tortillas. Vegetables Lettuce. Spinach. Peas. Beets. Cauliflower. Cabbage. Broccoli. Carrots. Tomatoes. Squash. Eggplant. Herbs. Peppers. Onions. Cucumbers. Brussels sprouts. Yams and sweet potatoes. Beans. Lentils. Fruits Bananas. Apples. Oranges. Grapes. Papaya. Mango. Pomegranate. Kiwi. Grapefruit. Cherries. Meats and Other Protein Sources Seafood and shellfish. Lean meats. Poultry. Tofu. Dairy Low-fat or fat-free dairy products, such as yogurt, cottage cheese, and cheese. Beverages Water. Sugar-free drinks. Tea. Coffee. Low-fat or skim milk. Milk alternatives, such as soy or almond milk. Real fruit juice. Condiments Mustard. Relish. Low-fat, low-sugar ketchup and barbecue sauce. Low-fat or fat-free mayonnaise. Sweets and Desserts Sugar-free sweets. Fats and Oils Avocado. Canola or olive oil. Nuts and nut butters. Seeds. The items listed above may not be a complete list of recommended foods or beverages. Contact your dietitian for more options.  WHAT FOODS ARE NOT RECOMMENDED? Palm oil and coconut oil. Processed foods. Fried foods. Sweetened drinks, such as sweet tea, milkshakes, snow cones, iced sweet drinks, and sodas. Alcohol. Sweets. Foods that contain a lot of salt or sodium. The items listed above may not be a complete list of foods and beverages to avoid. Contact your dietitian for more information.   This information is not intended to replace advice given to you by your health care provider. Make sure you discuss any questions you have with your health care provider.   Document Released: 01/17/2015 Document Reviewed: 01/17/2015 Elsevier Interactive Patient Education 2016  Elsevier Inc.     Hiatal Hernia A hiatal hernia occurs when part of your stomach slides above the muscle that separates your abdomen from your chest (diaphragm). You can be born with a hiatal hernia (congenital), or it may develop over time. In almost all cases of hiatal hernia, only the top part of the stomach pushes through.  Many people have a hiatal hernia with no symptoms. The larger the hernia, the more likely that you will have symptoms. In some cases, a hiatal hernia allows stomach acid to flow back into the tube that carries food from your mouth to your stomach (esophagus). This may cause heartburn symptoms. Severe heartburn symptoms may mean you have developed a condition called gastroesophageal reflux disease (GERD).  CAUSES  Hiatal hernias are caused by a weakness in the opening (hiatus) where your esophagus passes through your diaphragm to attach to the upper part of your stomach. You may be born with a weakness in your hiatus, or a weakness can develop. RISK FACTORS Older age is a major risk factor for a hiatal hernia. Anything that increases pressure on your diaphragm can also increase your risk of a hiatal hernia. This includes:  Pregnancy.  Excess weight.  Frequent constipation. SIGNS AND SYMPTOMS  People with a hiatal hernia often have no symptoms. If symptoms develop, they are almost always caused by GERD. They may include:  Heartburn.  Belching.  Indigestion.  Trouble swallowing.  Coughing or wheezing.  Sore throat.  Hoarseness.  Chest pain. DIAGNOSIS  A hiatal hernia is sometimes found during an exam for another problem. Your health care provider may suspect a hiatal hernia if you have symptoms of GERD. Tests may be done to diagnose GERD. These may include:  X-rays of your stomach or chest.  An upper gastrointestinal (GI) series. This is an X-ray exam of your GI tract involving the use of a chalky liquid that you swallow. The liquid shows up clearly on  the X-ray.  Endoscopy. This is a procedure to look into your stomach using a thin, flexible tube that has a tiny camera and light on the end of it. TREATMENT  If you have no symptoms, you may not need treatment. If you have symptoms, treatment may include:  Dietary and lifestyle changes to help reduce GERD symptoms.  Medicines. These may include:  Over-the-counter antacids.  Medicines that make your stomach empty more quickly.  Medicines that block the production of stomach acid (H2 blockers).  Stronger medicines to reduce stomach acid (proton pump inhibitors).  You may need surgery to repair the hernia if other treatments are not helping. HOME CARE INSTRUCTIONS   Take all medicines as directed by your health care provider.  Quit smoking, if you smoke.  Try to achieve and maintain a healthy body weight.  Eat frequent small meals instead of three large meals a day. This keeps your stomach from getting too full.  Eat slowly.  Do not lie down right after eating.  Do noteat 1-2 hours before bed.   Do not drink beverages with caffeine. These include cola, coffee, cocoa, and tea.  Do not drink alcohol.  Avoid foods that can make symptoms of GERD worse. These may include:  Fatty foods.  Citrus fruits.  Other foods and drinks that contain acid.  Avoid putting pressure on your belly. Anything that puts pressure on your belly increases the amount of acid that may be pushed up into your esophagus.   Avoid bending over, especially after eating.  Raise the head of your bed by putting blocks under the legs. This keeps your head and esophagus higher than your stomach.  Do not wear tight clothing around your chest or stomach.  Try not to strain when having a bowel movement, when urinating, or when lifting heavy objects. SEEK MEDICAL CARE IF:  Your symptoms are not controlled with medicines or lifestyle changes.  You are having trouble swallowing.  You have coughing  or wheezing that will not go away. SEEK IMMEDIATE MEDICAL CARE IF:  Your pain is getting worse.  Your pain spreads to your arms, neck, jaw, teeth, or back.  You have shortness of breath.  You sweat for no reason.  You feel sick to your stomach (nauseous) or vomit.  You vomit blood.  You have bright red blood in your stools.  You have black, tarry stools.    This information is not intended to replace advice given to you by your health care provider. Make sure you discuss any questions you have with your health care provider.   Document Released: 11/23/2003 Document Revised: 09/23/2014 Document Reviewed: 08/20/2013 Elsevier Interactive Patient Education 2016 [employer].     IF you received an x-ray today, you will receive an invoice from [facility]. Please contact [facility] at [phone] with questions or concerns regarding your invoice.   IF you received labwork today, you will receive an invoice from [employer]. Please contact [facility] at [phone] with  questions or concerns regarding your invoice.   Our billing staff will not be able to assist you with questions regarding bills from these companies.  You will be contacted with the lab results as soon as they are available. The fastest way to get your results is to activate your My Chart account. Instructions are located on the last page of this paperwork. If you have not heard from [place] regarding the results in 2 weeks, please contact this office.

## 2016-04-04 ENCOUNTER — Telehealth: Payer: Self-pay

## 2016-04-04 DIAGNOSIS — R160 Hepatomegaly, not elsewhere classified: Secondary | ICD-10-CM

## 2016-04-04 LAB — HEPATITIS B SURFACE ANTIBODY, QUANTITATIVE: HEPATITIS B-POST: 0 m[IU]/mL

## 2016-04-04 LAB — HEPATITIS C ANTIBODY: HCV Ab: NEGATIVE

## 2016-04-04 LAB — HEPATITIS B SURFACE ANTIGEN: HEP B S AG: NEGATIVE

## 2016-04-04 NOTE — Telephone Encounter (Signed)
[facility] called to request an order change.  The MRI Liver order needs to be changed to MRI Abdomen With and Without Contrast.  Until this is changed, they will not be able to schedule the patient.  CB#: [id number]

## 2016-04-04 NOTE — Telephone Encounter (Signed)
[facility] called to request an order change. The MRI Liver order needs to be changed to MRI Abdomen With and Without Contrast. Until this is changed, they will not be able to schedule the patient.  CB#: [id number]

## 2016-04-05 ENCOUNTER — Encounter: Payer: Self-pay | Admitting: Urgent Care

## 2016-04-05 NOTE — Telephone Encounter (Signed)
Ok done

## 2016-04-10 ENCOUNTER — Ambulatory Visit
Admission: RE | Admit: 2016-04-10 | Discharge: 2016-04-10 | Disposition: A | Discharge status: Home / Self Care | Payer: BLUE CROSS/BLUE SHIELD | Source: Ambulatory Visit | Attending: Emergency Medicine | Admitting: Emergency Medicine

## 2016-04-10 DIAGNOSIS — R160 Hepatomegaly, not elsewhere classified: Secondary | ICD-10-CM

## 2016-04-10 MED ORDER — GADOBENATE DIMEGLUMINE 529 MG/ML IV SOLN: 20.0000 mL | Freq: Once | INTRAVENOUS | Status: DC | PRN

## 2016-04-12 ENCOUNTER — Telehealth: Payer: Self-pay

## 2016-04-12 NOTE — Telephone Encounter (Signed)
PT IS NEEDING TO KNOW WHAT PAIN MEDICATION SHE CAN TAKE WITH A FATTY LIVER   BEST NUMBER [phone]

## 2016-04-15 NOTE — Telephone Encounter (Signed)
Will forward to [facility] as it looks like he has been helping her.  Philis Fendt, MS, PA-C 11:53 AM, 04/15/2016

## 2016-04-15 NOTE — Telephone Encounter (Signed)
If she is having moderate to severe pain, she needs to come in for an office visit. Otherwise, her liver enzymes were okay but the problem is that Tylenol, NSAIDs do affect the liver. For musculoskeletal pain, she needs to try ice and heat, ACE wraps. If she must take something orally for musculoskeletal pain, she needs to use this very sparingly and very occasionally, up to 2-3 times per week just once a day and just ibuprofen 200-400mg .

## 2016-04-16 ENCOUNTER — Other Ambulatory Visit: Payer: PRIVATE HEALTH INSURANCE

## 2016-04-16 NOTE — Telephone Encounter (Signed)
Pt advised.

## 2016-05-13 ENCOUNTER — Ambulatory Visit: Payer: BLUE CROSS/BLUE SHIELD

## 2016-05-29 ENCOUNTER — Ambulatory Visit: Payer: BLUE CROSS/BLUE SHIELD

## 2016-06-04 ENCOUNTER — Ambulatory Visit (INDEPENDENT_AMBULATORY_CARE_PROVIDER_SITE_OTHER): Payer: BLUE CROSS/BLUE SHIELD | Admitting: Urgent Care

## 2016-06-04 VITALS — BP 118/78 | HR 83 | Temp 98.1°F | Resp 17 | Ht 63.0 in | Wt 206.0 lb

## 2016-06-04 DIAGNOSIS — I10 Essential (primary) hypertension: Secondary | ICD-10-CM

## 2016-06-04 DIAGNOSIS — K760 Fatty (change of) liver, not elsewhere classified: Secondary | ICD-10-CM

## 2016-06-04 NOTE — Progress Notes (Signed)
    MRN: [id number] DOB: [birth year]  Subjective:   Marie Tate is a 55 y.o. female presenting for follow up on hepatic steatosis.   Patient is presenting for f/u on her hepatic steatosis and HTN. She has made significant lifestyle modifications, is following our recommendations strictly regarding what she can use for medications in the setting of her diagnosis. Her MRI did confirm that she does not have a malignant mass. She denies jaundice, n/v, abdominal pain, cough, chest pain. Denies smoking cigarettes or drinking alcohol. Regarding her BP, she has made significant dietary modifications. Denies dizziness, chronic headache, blurred vision, shortness of breath, heart racing, palpitations, hematuria, lower leg swelling. ROS as above otherwise.  Marie Tate has a current medication list which includes the following prescription(s): cholecalciferol, lisinopril-hydrochlorothiazide, and magnesium. Also has No Known Allergies.  Marie Tate  has a past medical history of Hypertension. Also  has a past surgical history that includes Uterine fibroid surgery.  Objective:   Vitals: BP 118/78 (BP Location: Right Arm, Patient Position: Sitting, Cuff Size: Large)   Pulse 83   Temp 98.1 F (36.7 C) (Oral)   Resp 17   Ht 5\' 3"  (1.6 m)   Wt 206 lb (93.4 kg)   LMP 01/14/2016 (Exact Date)   SpO2 99%   BMI 36.49 kg/m   Physical Exam  Constitutional: She is oriented to person, place, and time. She appears well-developed and well-nourished.  HENT:  Mouth/Throat: Oropharynx is clear and moist.  Eyes: No scleral icterus.  Cardiovascular: Normal rate, regular rhythm and intact distal pulses.  Exam reveals no gallop and no friction rub.   No murmur heard. Pulmonary/Chest: No respiratory distress. She has no wheezes. She has no rales.  Abdominal: Soft. Bowel sounds are normal. She exhibits no distension and no mass. There is no tenderness. There is no guarding.  Musculoskeletal: She exhibits no edema.   Neurological: She is alert and oriented to person, place, and time.  Skin: Skin is warm.    Assessment and Plan :   1. Hepatic steatosis 2. Fatty liver - Repeat labs today, stable, continue to monitor. Continue with lifestyle modifications. Recheck in 6 months.  3. Essential hypertension - Well controlled, no refills needed today.  Marie Eagles, PA-C [facility] [phone] 06/04/2016 11:05 AM

## 2016-06-04 NOTE — Patient Instructions (Addendum)
Fatty Liver Fatty liver, also called hepatic steatosis or steatohepatitis, is a condition in which too much fat has built up in your liver cells. The liver removes harmful substances from your bloodstream. It produces fluids your body needs. It also helps your body use and store energy from the food you eat. In many cases, fatty liver does not cause symptoms or problems. It is often diagnosed when tests are being done for other reasons. However, over time, fatty liver can cause inflammation that may lead to more serious liver problems, such as scarring of the liver (cirrhosis). CAUSES  Causes of fatty liver may include:   Drinking too much alcohol.  Poor nutrition.  Obesity.  Cushing syndrome.  Diabetes.  Hyperlipidemia.  Pregnancy.  Certain drugs.  Poisons.  Some viral infections. RISK FACTORS You may be more likely to develop fatty liver if you:  Abuse alcohol.  Are pregnant.  Are overweight.  Have diabetes.  Have hepatitis.  Have a high triglyceride level.  SIGNS AND SYMPTOMS  Fatty liver often does not cause any symptoms. In cases where symptoms develop, they can include:  Fatigue.  Weakness.  Weight loss.  Confusion.   Abdominal pain.  Yellowing of your skin and the white parts of your eyes (jaundice).  Nausea and vomiting. DIAGNOSIS  Fatty liver may be diagnosed by:   Physical exam and medical history.  Blood tests.  Imaging tests, such as an ultrasound, CT scan, or MRI.  Liver biopsy. A small sample of liver tissue is removed using a needle. The sample is then looked at under a microscope. TREATMENT  Fatty liver is often caused by other health conditions. Treatment for fatty liver may involve medicines and lifestyle changes to manage conditions such as:   Alcoholism.  High cholesterol.  Diabetes.  Being overweight or obese.  HOME CARE INSTRUCTIONS  Eat a healthy diet as directed by your health care provider.  Exercise regularly.  This can help you lose weight and control your cholesterol and diabetes. Talk to your health care provider about an exercise plan and which activities are best for you.  Do not drink alcohol.   Take medicines only as directed by your health care provider. SEEK MEDICAL CARE IF: You have difficulty controlling your:  Blood sugar.  Cholesterol.  Alcohol consumption. SEEK IMMEDIATE MEDICAL CARE IF:  You have abdominal pain.  You have jaundice.  You have nausea and vomiting.   This information is not intended to replace advice given to you by your health care provider. Make sure you discuss any questions you have with your health care provider.   Document Released: 10/18/2005 Document Revised: 09/23/2014 Document Reviewed: 01/12/2014 Elsevier Interactive Patient Education 2016 [employer].     IF you received an x-ray today, you will receive an invoice from [facility]. Please contact [facility] at [phone] with questions or concerns regarding your invoice.   IF you received labwork today, you will receive an invoice from [employer]. Please contact [facility] at [phone] with questions or concerns regarding your invoice.   Our billing staff will not be able to assist you with questions regarding bills from these companies.  You will be contacted with the lab results as soon as they are available. The fastest way to get your results is to activate your My Chart account. Instructions are located on the last page of this paperwork. If you have not heard from [place] regarding the results in 2 weeks, please contact this office.

## 2016-06-05 ENCOUNTER — Encounter: Payer: Self-pay | Admitting: Urgent Care

## 2016-06-05 LAB — COMPREHENSIVE METABOLIC PANEL
ALBUMIN: 4.5 g/dL (ref 3.6–5.1)
ALK PHOS: 88 U/L (ref 33–130)
ALT: 10 U/L (ref 6–29)
AST: 13 U/L (ref 10–35)
BILIRUBIN TOTAL: 0.5 mg/dL (ref 0.2–1.2)
BUN: 11 mg/dL (ref 7–25)
CALCIUM: 10.2 mg/dL (ref 8.6–10.4)
CO2: 23 mmol/L (ref 20–31)
Chloride: 103 mmol/L (ref 98–110)
Creat: 0.77 mg/dL (ref 0.50–1.05)
Glucose, Bld: 101 mg/dL — ABNORMAL HIGH (ref 65–99)
Potassium: 4.4 mmol/L (ref 3.5–5.3)
Sodium: 139 mmol/L (ref 135–146)
Total Protein: 7.6 g/dL (ref 6.1–8.1)

## 2016-06-12 ENCOUNTER — Encounter: Payer: Self-pay | Admitting: *Deleted

## 2016-07-08 ENCOUNTER — Ambulatory Visit: Payer: BLUE CROSS/BLUE SHIELD

## 2016-07-09 ENCOUNTER — Ambulatory Visit (INDEPENDENT_AMBULATORY_CARE_PROVIDER_SITE_OTHER): Payer: BLUE CROSS/BLUE SHIELD | Admitting: Student

## 2016-07-09 VITALS — BP 130/80 | HR 98 | Temp 98.4°F | Resp 17 | Ht 65.5 in | Wt 208.0 lb

## 2016-07-09 DIAGNOSIS — B9689 Other specified bacterial agents as the cause of diseases classified elsewhere: Secondary | ICD-10-CM | POA: Diagnosis not present

## 2016-07-09 DIAGNOSIS — Z23 Encounter for immunization: Secondary | ICD-10-CM | POA: Diagnosis not present

## 2016-07-09 DIAGNOSIS — R1013 Epigastric pain: Secondary | ICD-10-CM | POA: Insufficient documentation

## 2016-07-09 DIAGNOSIS — K219 Gastro-esophageal reflux disease without esophagitis: Secondary | ICD-10-CM | POA: Diagnosis not present

## 2016-07-09 DIAGNOSIS — N760 Acute vaginitis: Secondary | ICD-10-CM

## 2016-07-09 DIAGNOSIS — N912 Amenorrhea, unspecified: Secondary | ICD-10-CM | POA: Diagnosis not present

## 2016-07-09 DIAGNOSIS — K449 Diaphragmatic hernia without obstruction or gangrene: Secondary | ICD-10-CM | POA: Insufficient documentation

## 2016-07-09 DIAGNOSIS — N898 Other specified noninflammatory disorders of vagina: Secondary | ICD-10-CM

## 2016-07-09 DIAGNOSIS — R300 Dysuria: Secondary | ICD-10-CM

## 2016-07-09 LAB — POCT URINALYSIS DIP (MANUAL ENTRY)
Bilirubin, UA: NEGATIVE
GLUCOSE UA: NEGATIVE
Ketones, POC UA: NEGATIVE
Leukocytes, UA: NEGATIVE
Nitrite, UA: NEGATIVE
Protein Ur, POC: NEGATIVE
SPEC GRAV UA: 1.015
UROBILINOGEN UA: 0.2
pH, UA: 5.5

## 2016-07-09 LAB — POCT WET + KOH PREP
Trich by wet prep: ABSENT
YEAST BY WET PREP: ABSENT
Yeast by KOH: ABSENT

## 2016-07-09 LAB — POC MICROSCOPIC URINALYSIS (UMFC): Mucus: ABSENT

## 2016-07-09 LAB — POCT URINE PREGNANCY: Preg Test, Ur: NEGATIVE

## 2016-07-09 MED ORDER — METRONIDAZOLE 500 MG PO TABS: 500.0000 mg | ORAL_TABLET | Freq: Two times a day (BID) | ORAL | 0 refills | Status: DC

## 2016-07-09 MED ORDER — OMEPRAZOLE 40 MG PO CPDR: 40.0000 mg | DELAYED_RELEASE_CAPSULE | Freq: Every day | ORAL | 3 refills | Status: DC

## 2016-07-09 NOTE — Patient Instructions (Addendum)
   Bacterial Vaginosis Bacterial vaginosis is an infection of the vagina. It happens when too many germs (bacteria) grow in the vagina. Having this infection puts you at risk for getting other infections from sex. Treating this infection can help lower your risk for other infections, such as:   Chlamydia.  Gonorrhea.  HIV.  Herpes. HOME CARE  Take your medicine as told by your doctor.  Finish your medicine even if you start to feel better.  Tell your sex partner that you have an infection. They should see their doctor for treatment.  During treatment:  Avoid sex or use condoms correctly.  Do not douche.  Do not drink alcohol unless your doctor tells you it is ok.  Do not breastfeed unless your doctor tells you it is ok. GET HELP IF:  You are not getting better after 3 days of treatment.  You have more grey fluid (discharge) coming from your vagina than before.  You have more pain than before.  You have a fever. MAKE SURE YOU:   Understand these instructions.  Will watch your condition.  Will get help right away if you are not doing well or get worse.   This information is not intended to replace advice given to you by your health care provider. Make sure you discuss any questions you have with your health care provider.   Document Released: 06/11/2008 Document Revised: 09/23/2014 Document Reviewed: 04/14/2013 Elsevier Interactive Patient Education 2016 [employer].    IF you received an x-ray today, you will receive an invoice from [facility]. Please contact [facility] at [phone] with questions or concerns regarding your invoice.   IF you received labwork today, you will receive an invoice from [employer]. Please contact [facility] at [phone] with questions or concerns regarding your invoice.   Our billing staff will not be able to assist you with questions regarding bills from these companies.  You  will be contacted with the lab results as soon as they are available. The fastest way to get your results is to activate your My Chart account. Instructions are located on the last page of this paperwork. If you have not heard from [place] regarding the results in 2 weeks, please contact this office.

## 2016-07-09 NOTE — Progress Notes (Signed)
  Subjective:    CC: abdominal pain, dysuria, vaginal discharge, GERD  HPI: Presents with abdominal pain that occurred last Wednesday, felt intense pressure at her lower abdomen.  Felt urgency and some pain with urination.  Was seen at another Urgent Care and given phenophezidine without antibiotics.  Her pain is resolved.  She had some diarrhea on Friday, but that has resolved.  Denies nausea or vomiting.  She does complain of vaginal discharge that began last week.  Would like to be screen for gonorrhea and chlamydia but not other STDs, as she has been screened previously in the past year.  She is married.  LMP last month, but had not had one since April before that.  She believes she is perimenopausal. Has not had a flu shot this year.  Past medical history:  Hepatic steatosis, hiatal hernia.  See flowsheet/record as well for more information.  Surgical history: Negative.  See flowsheet/record as well for more information.  Family history: Negative.  See flowsheet/record as well for more information.  Social history: Negative.  See flowsheet/record as well for more information.  Allergies, and medications have been entered into the medical record, reviewed, and no changes needed.   Review of Systems: No fevers, chills, night sweats, weight loss, chest pain, or shortness of breath.   Objective:   BP 130/80 (BP Location: Right Arm, Patient Position: Sitting, Cuff Size: Normal)   Pulse 98   Temp 98.4 F (36.9 C) (Oral)   Resp 17   Ht 5' 5.5" (1.664 m)   Wt 208 lb (94.3 kg)   LMP 06/03/2016 (Approximate)   SpO2 97%   BMI 34.09 kg/m   General: Well Developed, well nourished, and in no acute distress.  Neuro: Alert and oriented x3, extra-ocular muscles intact, sensation grossly intact.  HEENT: Normocephalic, atraumatic, pupils equal round reactive to light, neck supple, no masses, no lymphadenopathy, thyroid nonpalpable.  Skin: Warm and dry, no rashes. Cardiac: Regular rate and rhythm,  no murmurs rubs or gallops, no lower extremity edema.  Respiratory: Clear to auscultation bilaterally. Not using accessory muscles, speaking in full sentences. Abdomen: +BS, mild TTP in epigastric area, none in suprapubic area.   GU: copious white homogenous vaginal discharge seen.  No CMT, no pain over ovaries. Uterus felt enlarged.  Impression and Recommendations:    GERD (gastroesophageal reflux disease) Continue PPI, new rx sent.  Since has uncontrolled GERD previously despite PPI, will refer to GI for possible endoscopy.    Epigastric pain Likely 2/2 GERD and hiatal hernia.  Follow up with GI and continue PPI.    Bacterial vaginosis Will treat with flagyl.  Greek yogurt, cotton underwear.  Handout given.  Follow up if not improved.  UA did not show infection.  STDs checked.  Urine pregnancy negative.

## 2016-07-09 NOTE — Assessment & Plan Note (Addendum)
Continue PPI, new rx sent.  Since has uncontrolled GERD previously despite PPI, will refer to GI for possible endoscopy.

## 2016-07-09 NOTE — Assessment & Plan Note (Signed)
Likely 2/2 GERD and hiatal hernia.  Follow up with GI and continue PPI.

## 2016-07-09 NOTE — Assessment & Plan Note (Addendum)
Will treat with flagyl.  Greek yogurt, cotton underwear.  Handout given.  Follow up if not improved.  UA did not show infection.  STDs checked.  Urine pregnancy negative.

## 2016-07-10 LAB — GC/CHLAMYDIA PROBE AMP
CT PROBE, AMP APTIMA: NOT DETECTED
GC Probe RNA: NOT DETECTED

## 2016-07-17 ENCOUNTER — Telehealth: Payer: Self-pay

## 2016-07-17 NOTE — Telephone Encounter (Signed)
Pt called to check on referral -- I gave her the info she needed. Also wanted to state that she is having some cramping in her leg & wonders if it could be due to the medication she was prescribed at her last visit. Would like a call back when possible.

## 2016-07-18 ENCOUNTER — Telehealth: Payer: Self-pay | Admitting: Emergency Medicine

## 2016-07-18 NOTE — Telephone Encounter (Signed)
Pt returned call- Experiencing right leg, intermit leg cramps that worsens with lying down at night.   Advised to schedule f/u visit

## 2016-07-18 NOTE — Telephone Encounter (Signed)
Left message to return call 

## 2016-08-29 ENCOUNTER — Ambulatory Visit: Payer: BLUE CROSS/BLUE SHIELD | Admitting: Obstetrics and Gynecology

## 2016-09-23 ENCOUNTER — Ambulatory Visit: Payer: BLUE CROSS/BLUE SHIELD | Admitting: Obstetrics and Gynecology

## 2016-09-30 ENCOUNTER — Encounter (HOSPITAL_COMMUNITY): Payer: Self-pay | Admitting: Emergency Medicine

## 2016-09-30 ENCOUNTER — Ambulatory Visit (HOSPITAL_COMMUNITY)
Admission: EM | Admit: 2016-09-30 | Discharge: 2016-09-30 | Disposition: A | Discharge status: Home / Self Care | Payer: BLUE CROSS/BLUE SHIELD | Attending: Emergency Medicine | Admitting: Emergency Medicine

## 2016-09-30 DIAGNOSIS — M25561 Pain in right knee: Secondary | ICD-10-CM

## 2016-09-30 MED ORDER — TRIAMCINOLONE ACETONIDE 40 MG/ML IJ SUSP
INTRAMUSCULAR | Status: AC
  Filled 2016-09-30: qty 1

## 2016-09-30 MED ORDER — BUPIVACAINE HCL (PF) 0.5 % IJ SOLN
INTRAMUSCULAR | Status: AC
  Filled 2016-09-30: qty 10

## 2016-09-30 MED ORDER — NAPROXEN 500 MG PO TABS: 500.0000 mg | ORAL_TABLET | Freq: Two times a day (BID) | ORAL | 0 refills | Status: DC

## 2016-09-30 NOTE — ED Provider Notes (Signed)
CSN: [id number]     Arrival date & time 09/30/16  1244 History   None    Chief Complaint  Patient presents with  . Knee Pain   (Consider location/radiation/quality/duration/timing/severity/associated sxs/prior Treatment) Patient c/o right knee pain and feels like the pain she had 2 years ago and had a knee injection which made the pain go away.   The history is provided by the patient.  Knee Pain  Location:  Knee Time since incident:  5 days Injury: no   Knee location:  R knee Pain details:    Quality:  Aching   Radiates to:  Does not radiate   Severity:  Moderate   Onset quality:  Sudden   Duration:  1 week   Timing:  Constant   Progression:  Worsening Chronicity:  New Dislocation: no   Foreign body present:  Unable to specify Tetanus status:  Unknown Relieved by:  None tried Worsened by:  Bearing weight Ineffective treatments:  None tried   Past Medical History:  Diagnosis Date  . Hypertension    Past Surgical History:  Procedure Laterality Date  . UTERINE FIBROID SURGERY     Family History  Problem Relation Age of Onset  . Heart attack Mother   . Stroke Father    Social History  Substance Use Topics  . Smoking status: Former Research scientist (life sciences)  . Smokeless tobacco: Not on file  . Alcohol use Yes     Comment: occ   OB History    No data available     Review of Systems  Constitutional: Negative.   HENT: Negative.   Eyes: Negative.   Respiratory: Negative.  Negative for apnea.   Cardiovascular: Negative.   Gastrointestinal: Negative.   Endocrine: Negative.   Genitourinary: Negative.   Musculoskeletal: Positive for arthralgias.  Allergic/Immunologic: Negative.   Neurological: Negative.   Hematological: Negative.     Allergies  Patient has no known allergies.  Home Medications   Prior to Admission medications   Medication Sig Start Date End Date Taking? Authorizing Provider  lisinopril-hydrochlorothiazide (PRINZIDE,ZESTORETIC) 10-12.5 MG tablet Take 1  tablet by mouth daily. 02/27/16  Yes Historical Provider, MD  Magnesium 250 MG TABS Take 250 mg by mouth daily.   Yes Historical Provider, MD  cholecalciferol (VITAMIN D) 1000 UNITS tablet Take 1,000 Units by mouth daily.    Historical Provider, MD  metroNIDAZOLE (FLAGYL) 500 MG tablet Take 1 tablet (500 mg total) by mouth 2 (two) times daily. Patient not taking: Reported on [id number]   Marie Putt B Chitanand, DO  naproxen (NAPROSYN) 500 MG tablet Take 1 tablet (500 mg total) by mouth 2 (two) times daily with a meal. 09/30/16   Lysbeth Penner, FNP  omeprazole (PRILOSEC) 40 MG capsule Take 1 capsule (40 mg total) by mouth daily. [id number]   Deirdre Evener Chitanand, DO   Meds Ordered and Administered this Visit  Medications - No data to display  BP 148/81 (BP Location: Left Arm) Comment (BP Location): large cuff  Pulse 69   Temp 98.1 F (36.7 C) (Oral)   Resp 18   LMP 06/03/2016 (Approximate)   SpO2 100%  No data found.   Physical Exam  Constitutional: She appears well-developed and well-nourished.  HENT:  Head: Normocephalic and atraumatic.  Eyes: EOM are normal. Pupils are equal, round, and reactive to light.  Neck: Normal range of motion. Neck supple.  Cardiovascular: Normal rate, regular rhythm and normal heart sounds.   Pulmonary/Chest: Effort normal and breath sounds normal.  Abdominal: Soft.  Musculoskeletal: She exhibits tenderness.  TTP right posterior popliteal fossa and TTP pre patelllar bursae.  Tenderness with flexion and extension of right knee.  Neg Drawer.  Nursing note and vitals reviewed.   Urgent Care Course   Clinical Course     .Joint Aspiration/Arthrocentesis Date/Time: 09/30/2016 3:28 PM Performed by: Lysbeth Penner Authorized by: Melony Overly   Consent:    Consent obtained:  Verbal   Consent given by:  Patient   Risks discussed:  Bleeding   Alternatives discussed:  No treatment Location:    Location:  Knee   Knee:  R knee Anesthesia (see  MAR for exact dosages):    Anesthesia method:  None Procedure details:    Needle gauge:  22 G   Ultrasound guidance: no     Approach:  Lateral   Aspirate characteristics:  Clear   Steroid injected: yes     Specimen collected: no   Post-procedure details:    Dressing:  Adhesive bandage   Patient tolerance of procedure:  Tolerated well, no immediate complications Comments:     2 cc's of 1% lidocaine and 2 cc's of bipuvicaine and 1 cc of 40mg  Kenalog injected into right knee.   (including critical care time)  Labs Review Labs Reviewed - No data to display  Imaging Review No results found.   Visual Acuity Review  Right Eye Distance:   Left Eye Distance:   Bilateral Distance:    Right Eye Near:   Left Eye Near:    Bilateral Near:         MDM   1. Right knee pain, unspecified chronicity    Right knee injection with 2 cc's of lido and 2cc's of bipuvicaine and 1 cc of 40mg  kenalog Naprosyn 40g one po bid #20 Work note for today and tomorrow.      Lysbeth Penner, FNP 09/30/16 1530

## 2016-09-30 NOTE — ED Triage Notes (Signed)
2 year history of right knee pain.  This episode started last Thursday.  Seemed to improve and then worsened.

## 2016-10-14 ENCOUNTER — Ambulatory Visit: Payer: BLUE CROSS/BLUE SHIELD

## 2016-11-02 ENCOUNTER — Ambulatory Visit (INDEPENDENT_AMBULATORY_CARE_PROVIDER_SITE_OTHER): Payer: BLUE CROSS/BLUE SHIELD

## 2016-11-02 ENCOUNTER — Ambulatory Visit (INDEPENDENT_AMBULATORY_CARE_PROVIDER_SITE_OTHER): Payer: BLUE CROSS/BLUE SHIELD | Admitting: Family Medicine

## 2016-11-02 VITALS — BP 140/80 | HR 65 | Temp 98.6°F | Resp 16 | Ht 65.5 in | Wt 204.0 lb

## 2016-11-02 DIAGNOSIS — M25561 Pain in right knee: Secondary | ICD-10-CM | POA: Diagnosis not present

## 2016-11-02 DIAGNOSIS — G629 Polyneuropathy, unspecified: Secondary | ICD-10-CM | POA: Diagnosis not present

## 2016-11-02 MED ORDER — TRAMADOL HCL 50 MG PO TABS: 50.0000 mg | ORAL_TABLET | Freq: Three times a day (TID) | ORAL | 0 refills | Status: DC | PRN

## 2016-11-02 MED ORDER — PREDNISONE 20 MG PO TABS: ORAL_TABLET | ORAL | 0 refills | Status: DC

## 2016-11-02 NOTE — Progress Notes (Signed)
Patient ID: Marie Tate, female    DOB: 30-Mar-1961, 56 y.o.   MRN: [id number]  PCP: Philis Fendt, MD  Chief Complaint  Patient presents with  . Edema    Right Knee    Subjective:  HPI  56 year old female presents for evaluation of right knee swelling pain x  2014 fell on knee and she was told that she had osteoarthritis. She has missed work a total 4 days due to her knee swelling and pain.  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 Reports neuropathy and numbness and tingling bilateral feet for an extended period of time. No known hx of diabetes. Requests a work note as she has missed several days of work.  Social History   Social History  . Marital status: Married    Spouse name: N/A  . Number of children: N/A  . Years of education: N/A   Occupational History  . Not on file.   Social History Main Topics  . Smoking status: Former Research scientist (life sciences)  . Smokeless tobacco: Never Used  . Alcohol use Yes     Comment: occ  . Drug use: No  . Sexual activity: Yes    Birth control/ protection: Surgical   Other Topics Concern  . Not on file   Social History Narrative  . No narrative on file    Family History  Problem Relation Age of Onset  . Heart attack Mother   . Stroke Father    Review of Systems See HPI Patient Active Problem List   Diagnosis Date Noted  . Bacterial vaginosis 07/09/2016  . Epigastric pain 07/09/2016  . Dysuria 07/09/2016  . Hiatal hernia 07/09/2016  . GERD (gastroesophageal reflux disease) 07/09/2016    No Known Allergies  Prior to Admission medications   Medication Sig Start Date End Date Taking? Authorizing Provider  lisinopril-hydrochlorothiazide (PRINZIDE,ZESTORETIC) 10-12.5 MG tablet Take 1 tablet by mouth daily. 02/27/16  Yes Historical Provider, MD  Magnesium 250 MG TABS Take 250 mg by mouth daily.   Yes Historical Provider, MD  cholecalciferol (VITAMIN D) 1000 UNITS tablet Take 1,000 Units by mouth daily.     Historical Provider, MD    Past Medical, Surgical Family and Social History reviewed and updated.    Objective:   Today's Vitals   11/02/16 1005  BP: 140/80  Pulse: 65  Resp: 16  Temp: 98.6 F (37 C)  TempSrc: Oral  SpO2: 99%  Weight: 204 lb (92.5 kg)  Height: 5' 5.5" (1.664 m)    Wt Readings from Last 3 Encounters:  11/02/16 204 lb (92.5 kg)  07/09/16 208 lb (94.3 kg)  06/04/16 206 lb (93.4 kg)   Physical Exam  Constitutional: She appears well-developed and well-nourished.  HENT:  Head: Normocephalic and atraumatic.  Eyes: Pupils are equal, round, and reactive to light.  Neck: Normal range of motion. Neck supple.  Cardiovascular: Normal rate, regular rhythm, normal heart sounds and intact distal pulses.   Pulmonary/Chest: Effort normal and breath sounds normal.  Musculoskeletal: She exhibits edema and tenderness.  Patellar tenderness distally Tenderness along the lateral meniscus and medially of LC ligament  Lymphadenopathy:    She has no cervical adenopathy.  Neurological: She is alert.  Skin: Skin is warm and dry.  Psychiatric: She has a normal mood and affect. Her behavior is normal. Judgment and thought content normal.   Dg Knee Complete 4 Views Right  Result Date: 11/02/2016 CLINICAL DATA:  Right knee pain  for 2-3 weeks.  No injury. EXAM: RIGHT KNEE - COMPLETE 4+ VIEW COMPARISON:  None. FINDINGS: No fracture. No bone lesion. The knee joints are normally spaced and aligned. There are minor marginal osteophytes from the medial compartment and superior and inferior patella. Mild irregularity is noted along the dorsal aspect of the patella. No other arthropathic change. Minimal joint effusion. Soft tissues are unremarkable. IMPRESSION: 1. No fracture or bone lesion. 2. Mild degenerative change involving the medial compartment and patellofemoral joint space compartments. 3. Minimal joint effusion. Electronically Signed   By: Lajean Manes M.D.   On: 11/02/2016 11:09    Assessment & Plan:  1. Acute pain of right knee 2. Neuropathy   -Take Prednisone 20 mg,  in mornings with breakfast as follows:  Take 3 pills for 3 days, Take 2 pills for 3 days, and Take 1 pill for 3 days.  Complete all medication. -if knee pain persists for flares become more frequent, consider orthopedic referral for further evaluation. -Take Tramadol for acute severe pain every 8 hours as needed. You will be notified of your labs once they results.  Carroll Sage. Kenton Kingfisher, MSN, FNP-C [facility] [place]

## 2016-11-02 NOTE — Patient Instructions (Addendum)
Take Prednisone 20 mg,  in mornings with breakfast as follows:  Take 3 pills for 3 days, Take 2 pills for 3 days, and Take 1 pill for 3 days.  Complete all medication.  Take Tramadol for acute severe pain every 8 hours as needed.  You will be notified of your labs once they results.  IF you received an x-ray today, you will receive an invoice from [facility]. Please contact [facility] at [phone] with questions or concerns regarding your invoice.   IF you received labwork today, you will receive an invoice from [place]. Please contact [facility] at [phone] with questions or concerns regarding your invoice.   Our billing staff will not be able to assist you with questions regarding bills from these companies.  You will be contacted with the lab results as soon as they are available. The fastest way to get your results is to activate your My Chart account. Instructions are located on the last page of this paperwork. If you have not heard from [place] regarding the results in 2 weeks, please contact this office.

## 2016-11-03 LAB — URIC ACID: Uric Acid: 6.4 mg/dL (ref 2.5–7.1)

## 2016-11-03 LAB — HEMOGLOBIN A1C
Est. average glucose Bld gHb Est-mCnc: 117 mg/dL
Hgb A1c MFr Bld: 5.7 % — ABNORMAL HIGH (ref 4.8–5.6)

## 2016-11-03 LAB — SEDIMENTATION RATE: Sed Rate: 8 mm/hr (ref 0–40)

## 2016-11-04 ENCOUNTER — Encounter: Payer: Self-pay | Admitting: Family Medicine

## 2016-11-04 NOTE — Progress Notes (Signed)
November 04, 2016   Marie Tate [address] [place] [address]   Dear [name],  Below are the results from your recent visit were as follows: Your hemoglobin A1C, which is a 3 month average of your glucose, Indicates that you are within the prediabetic range. You are not within the range to be treated with anti-diabetic medication. Although I would like for you to increase physical activity and reduce intake of food high in simple sugars. For more information on diet recommendations for diabetic diets Review the following web site for additional information: Doggy[employer]ort[employer]ch  Resulted Orders  Hemoglobin A1c  Result Value Ref Range   Hgb A1c MFr Bld 5.7 (H) 4.8 - 5.6 %     Comment:              Pre-diabetes: 5.7 - 6.4          Diabetes: >6.4          Glycemic control for adults with diabetes: <7.0    Est. average glucose Bld gHb Est-mCnc 117 mg/dL   Narrative   Performed at:  [address], [place], [place]  [id number] Lab Director: Lindon Romp MD, Phone:  [phone]  Uric Acid  Result Value Ref Range   Uric Acid 6.4 2.5 - 7.1 mg/dL     Comment:                Therapeutic target for gout patients: <6.0   Narrative   Performed at:  [facility] [address], [place], [place]  [id number] Lab Director: Lindon Romp MD, Phone:  [phone]  Sedimentation Rate  Result Value Ref Range   Sed Rate 8 0 - 40 mm/hr   Narrative   Performed at:  [address], [place], [place]  [id number] Lab Director: Lindon Romp MD, Phone:  [phone]     If you have any questions or concerns, please don't hesitate to call.  Sincerely,   Carroll Sage. Kenton Kingfisher, MSN, FNP-C [facility] at [place]

## 2016-11-09 ENCOUNTER — Ambulatory Visit: Payer: BLUE CROSS/BLUE SHIELD

## 2016-11-24 ENCOUNTER — Emergency Department (HOSPITAL_BASED_OUTPATIENT_CLINIC_OR_DEPARTMENT_OTHER)
Admission: EM | Admit: 2016-11-24 | Discharge: 2016-11-24 | Disposition: A | Discharge status: Home / Self Care | Payer: BLUE CROSS/BLUE SHIELD | Source: Home / Self Care | Attending: Emergency Medicine | Admitting: Emergency Medicine

## 2016-11-24 ENCOUNTER — Encounter (HOSPITAL_COMMUNITY): Payer: Self-pay

## 2016-11-24 ENCOUNTER — Emergency Department (HOSPITAL_COMMUNITY)
Admission: EM | Admit: 2016-11-24 | Discharge: 2016-11-24 | Disposition: A | Discharge status: Home / Self Care | Payer: BLUE CROSS/BLUE SHIELD | Attending: Emergency Medicine | Admitting: Emergency Medicine

## 2016-11-24 DIAGNOSIS — M25561 Pain in right knee: Secondary | ICD-10-CM | POA: Diagnosis not present

## 2016-11-24 DIAGNOSIS — Z87891 Personal history of nicotine dependence: Secondary | ICD-10-CM | POA: Diagnosis not present

## 2016-11-24 DIAGNOSIS — M79609 Pain in unspecified limb: Secondary | ICD-10-CM

## 2016-11-24 DIAGNOSIS — Z79899 Other long term (current) drug therapy: Secondary | ICD-10-CM | POA: Insufficient documentation

## 2016-11-24 DIAGNOSIS — M79661 Pain in right lower leg: Secondary | ICD-10-CM | POA: Diagnosis present

## 2016-11-24 DIAGNOSIS — I10 Essential (primary) hypertension: Secondary | ICD-10-CM | POA: Insufficient documentation

## 2016-11-24 MED ORDER — TRAMADOL HCL 50 MG PO TABS: 50.0000 mg | ORAL_TABLET | Freq: Four times a day (QID) | ORAL | 0 refills | Status: DC | PRN

## 2016-11-24 MED ORDER — IBUPROFEN 200 MG PO TABS
400.0000 mg | ORAL_TABLET | Freq: Once | ORAL | Status: DC
  Filled 2016-11-24: qty 2

## 2016-11-24 MED ORDER — ACETAMINOPHEN 500 MG PO TABS
1000.0000 mg | ORAL_TABLET | Freq: Once | ORAL | Status: DC
  Filled 2016-11-24: qty 2

## 2016-11-24 NOTE — ED Notes (Signed)
Pt was given some thing to eat

## 2016-11-24 NOTE — Discharge Instructions (Signed)
It was our pleasure to provide your ER care today - we hope that you feel better.  Rest area. Ice/coldpacks to sore area.   For next 1-2 weeks, avoid wearing any shoes or heels that may exacerbate your pain.   Take motrin or aleve as need for pain.  You may also take ultram as need for pain - no driving when taking.  Follow up with primary care doctor in 1 week if symptoms fail to improve/resolve.   Return to ER if worse, new symptoms, fevers, severe or intractable pain, numbness/weakness, other concern.

## 2016-11-24 NOTE — ED Provider Notes (Signed)
[facility] DEPT Provider Note   CSN: [id number] Arrival date & time: 11/24/16  1032     History   Chief Complaint Chief Complaint  Patient presents with  . Knee Pain    right and left knee pain    HPI Charene Mccallister is a 56 y.o. female.  Patient c/o right leg and knee pain and swelling in the past month. Symptoms gradual onset, persistent, mild-moderate. Denies injury or strain. No overuse or new exercise. Hx intermittent knee pain in past. Denies numbness/weakness. No skin changes or lesion. No fever or chills. Denies cp or sob. No hx dvt or pe.    The history is provided by the patient.  Knee Pain   Pertinent negatives include no numbness.    Past Medical History:  Diagnosis Date  . Hypertension     Patient Active Problem List   Diagnosis Date Noted  . Bacterial vaginosis 07/09/2016  . Epigastric pain 07/09/2016  . Dysuria 07/09/2016  . Hiatal hernia 07/09/2016  . GERD (gastroesophageal reflux disease) 07/09/2016    Past Surgical History:  Procedure Laterality Date  . UTERINE FIBROID SURGERY      OB History    No data available       Home Medications    Prior to Admission medications   Medication Sig Start Date End Date Taking? Authorizing Provider  cholecalciferol (VITAMIN D) 1000 UNITS tablet Take 1,000 Units by mouth daily.    Historical Provider, MD  lisinopril-hydrochlorothiazide (PRINZIDE,ZESTORETIC) 10-12.5 MG tablet Take 1 tablet by mouth daily. 02/27/16   Historical Provider, MD  Magnesium 250 MG TABS Take 250 mg by mouth daily.    Historical Provider, MD  predniSONE (DELTASONE) 20 MG tablet Take 3 PO QAM x3days, 2 PO QAM x3days, 1 PO QAM x3days 11/02/16   Sedalia Muta, FNP  traMADol (ULTRAM) 50 MG tablet Take 1 tablet (50 mg total) by mouth every 8 (eight) hours as needed. 11/02/16   Sedalia Muta, FNP    Family History Family History  Problem Relation Age of Onset  . Heart attack Mother   . Stroke Father      Social History Social History  Substance Use Topics  . Smoking status: Former Research scientist (life sciences)  . Smokeless tobacco: Never Used  . Alcohol use No     Comment: occ     Allergies   Patient has no known allergies.   Review of Systems Review of Systems  Constitutional: Negative for chills and fever.  Respiratory: Negative for shortness of breath.   Cardiovascular: Negative for chest pain.  Musculoskeletal: Negative for back pain.  Neurological: Negative for weakness and numbness.     Physical Exam Updated Vital Signs BP 150/83 (BP Location: Left Arm)   Pulse 78   Temp 98.9 F (37.2 C) (Oral)   Resp 18   Ht 5\' 5"  (1.651 m)   Wt 92.5 kg   LMP 06/03/2016 (Approximate)   SpO2 98%   BMI 33.95 kg/m   Physical Exam  Constitutional: She appears well-developed and well-nourished. No distress.  Eyes: Conjunctivae are normal. No scleral icterus.  Neck: Neck supple. No tracheal deviation present.  Cardiovascular: Normal rate, regular rhythm, normal heart sounds and intact distal pulses.   Pulmonary/Chest: Effort normal and breath sounds normal. No respiratory distress.  Abdominal: Normal appearance.  Musculoskeletal: She exhibits no edema.  Mild tenderness right lower leg.  Compartments of leg soft, not tense. Distal pulses palp. Good rom right knee without pain. No effusion. Knee stable.  Neurological: She is alert.  Skin: Skin is warm and dry. No rash noted. She is not diaphoretic.  Psychiatric: She has a normal mood and affect.  Nursing note and vitals reviewed.    ED Treatments / Results  Labs (all labs ordered are listed, but only abnormal results are displayed) Labs Reviewed - No data to display  EKG  EKG Interpretation None       Radiology No results found.  Procedures Procedures (including critical care time)  Medications Ordered in ED Medications  ibuprofen (ADVIL,MOTRIN) tablet 400 mg (not administered)  acetaminophen (TYLENOL) tablet 1,000 mg (not  administered)     Initial Impression / Assessment and Plan / ED Course  I have reviewed the triage vital signs and the nursing notes.  Pertinent labs & imaging results that were available during my care of the patient were reviewed by me and considered in my medical decision making (see chart for details).  Motrin po.  Patient indicates she is interested in making sure there is no clot/dvt.  Vascular u/s ordered.  Reviewed nursing notes and prior charts for additional history. Pt with recent right knee xrays - some deg changes noted.   Vascular doppler negative for dvt:   Progress Notes Date of Service: 11/24/2016 3:09 PM [place] D Slaughter  Vascular Lab    [] Hide copied text [] Hover for attribution information VASCULAR LAB PRELIMINARY  PRELIMINARY  PRELIMINARY  PRELIMINARY  Right lower extremity venous duplex completed.    Preliminary report:  Right:  No evidence of DVT, superficial thrombosis, or Baker's cyst.  SLAUGHTER, [place], RVS  11/24/2016, 3:09 PM        Rec symptomatic relief, cold packs, meds, etc, and rec pcp f/u.    Final Clinical Impressions(s) / ED Diagnoses   Final diagnoses:  None    New Prescriptions New Prescriptions   No medications on file     Lajean Saver, MD 11/24/16 1514

## 2016-11-24 NOTE — Progress Notes (Signed)
VASCULAR LAB PRELIMINARY  PRELIMINARY  PRELIMINARY  PRELIMINARY  Right lower extremity venous duplex completed.    Preliminary report:  Right:  No evidence of DVT, superficial thrombosis, or Baker's cyst.  Lessa Huge, RVS 11/24/2016, 3:09 PM

## 2016-11-24 NOTE — ED Notes (Signed)
Patient still waiting for LE venous to be done.

## 2016-11-24 NOTE — ED Triage Notes (Signed)
Patient c/o right and left knee pain. Pain is worse on the right knee rating her pain at 8/10. This started a week ago and continue to get worse.

## 2016-12-07 ENCOUNTER — Ambulatory Visit: Payer: BLUE CROSS/BLUE SHIELD

## 2016-12-07 ENCOUNTER — Telehealth: Payer: Self-pay | Admitting: General Practice

## 2016-12-07 DIAGNOSIS — M25569 Pain in unspecified knee: Secondary | ICD-10-CM

## 2016-12-07 NOTE — Telephone Encounter (Signed)
Pt is canceling apt today and would like to know if she can get a referral to ortho for her knee pain. She discussed this issue during her most recent OV on 11/02/16 with Molli Barrows and states that the issue has not resolved yet.

## 2016-12-07 NOTE — Telephone Encounter (Signed)
Please call pt and let her know I have placed the referral.

## 2016-12-13 ENCOUNTER — Ambulatory Visit (INDEPENDENT_AMBULATORY_CARE_PROVIDER_SITE_OTHER): Payer: BLUE CROSS/BLUE SHIELD | Admitting: Physician Assistant

## 2016-12-13 VITALS — BP 136/86 | HR 94 | Temp 98.7°F | Resp 16 | Ht 65.25 in | Wt 202.0 lb

## 2016-12-13 DIAGNOSIS — M1711 Unilateral primary osteoarthritis, right knee: Secondary | ICD-10-CM

## 2016-12-13 MED ORDER — PREDNISONE 50 MG PO TABS: ORAL_TABLET | ORAL | 0 refills | Status: DC

## 2016-12-13 MED ORDER — ACETAMINOPHEN 500 MG PO TABS: 1000.0000 mg | ORAL_TABLET | Freq: Three times a day (TID) | ORAL | 99 refills | Status: AC | PRN

## 2016-12-13 NOTE — Progress Notes (Signed)
12/13/2016 2:51 PM   DOB: 1961-03-21 / MRN: [id number]  SUBJECTIVE:  Marie Tate is a 56 y.o. female presenting for right knee pain that is not getting better.  Tells me this knee has been bothering her since the beginning of the year.  Feels that she is getting worse. There is some swelling today.  Has tried pred which helped some and has been taking tramadol which has also helped some.    Tells me she can't take NSAIDS due to fatty liver infiltration. Has a history of GERD that lead to her diagnosis, of fatty liver.   She missed a total of 10 days of work for this knee pain.  She would like FMLA paperwork completed today. She denies a history of osteoporosis or osteopenia.   She has No Known Allergies.   She  has a past medical history of Hypertension.    She  reports that she has quit smoking. She has never used smokeless tobacco. She reports that she does not drink alcohol or use drugs. She  reports that she currently engages in sexual activity. She reports using the following method of birth control/protection: Surgical. The patient  has a past surgical history that includes Uterine fibroid surgery.  Her family history includes Heart attack in her mother; Stroke in her father.  Review of Systems  Respiratory: Negative for cough.   Cardiovascular: Negative for chest pain and leg swelling.  Genitourinary: Negative for dysuria, flank pain, frequency, hematuria and urgency.  Neurological: Negative for dizziness (orthostatic).    The problem list and medications were reviewed and updated by myself where necessary and exist elsewhere in the encounter.   OBJECTIVE:  BP 136/86   Pulse 94   Temp 98.7 F (37.1 C) (Oral)   Resp 16   Ht 5' 5.25" (1.657 m)   Wt 202 lb (91.6 kg)   LMP 06/03/2016 (Approximate)   SpO2 98%   BMI 33.36 kg/m   Lab Results  Component Value Date   CREATININE 0.77 06/04/2016   BUN 11 06/04/2016   NA 139 06/04/2016   K 4.4 06/04/2016   CL 103 06/04/2016    CO2 23 06/04/2016   Lab Results  Component Value Date   ALT 10 06/04/2016   AST 13 06/04/2016   ALKPHOS 88 06/04/2016   BILITOT 0.5 06/04/2016    Physical Exam  Constitutional: She is oriented to person, place, and time.  Cardiovascular: Normal rate and regular rhythm.   Musculoskeletal: Normal range of motion. She exhibits no edema.  Neurological: She is alert and oriented to person, place, and time. She displays normal reflexes. No cranial nerve deficit. She exhibits normal muscle tone. Coordination normal.   Lab Results  Component Value Date   HGBA1C 5.7 (H) 11/02/2016     No results found for this or any previous visit (from the past 72 hour(s)).  No results found.  ASSESSMENT AND PLAN:  Rula was seen today for follow-up, nausea and dizziness.  Diagnoses and all orders for this visit:  Primary osteoarthritis of right knee -     predniSONE (DELTASONE) 50 MG tablet; Take one tab daily. -     acetaminophen (TYLENOL) 500 MG tablet; Take 2 tablets (1,000 mg total) by mouth every 8 (eight) hours as needed for mild pain, moderate pain or fever. Take 2 tabs every 8 hours.    The patient is advised to call or return to clinic if she does not see an improvement in symptoms, or to seek  the care of the closest emergency department if she worsens with the above plan.   Philis Fendt, MHS, PA-C [facility] 12/13/2016 2:51 PM

## 2016-12-13 NOTE — Patient Instructions (Addendum)
  Take tylenol 1000 mg every 8 hours for knee pain.  Please see your orthopedic doctor for further management of this knee problem.   IF you received an x-ray today, you will receive an invoice from [facility]. Please contact [facility]iology at [phone] with questions or concerns regarding your invoice.   IF you received labwork today, you will receive an invoice from [facility]. Please contact [facility] at [phone] with questions or concerns regarding your invoice.   Our billing staff will not be able to assist you with questions regarding bills from these companies.  You will be contacted with the lab results as soon as they are available. The fastest way to get your results is to activate your My Chart account. Instructions are located on the last page of this paperwork. If you have not heard from [place] regarding the results in 2 weeks, please contact this office.

## 2016-12-17 ENCOUNTER — Telehealth: Payer: Self-pay | Admitting: Family Medicine

## 2016-12-17 NOTE — Telephone Encounter (Signed)
Ov? Or just drop off papers

## 2016-12-17 NOTE — Telephone Encounter (Signed)
Pt calling about short term disability she states that Philis Fendt filled out paper work for her to return to work on Monday but her knee has swollen back up and she has a Hx of knee pain that's from 2014 to present her employer has told her that she is able to get the short term because of her Hx of knee pain the employer need some kind of letter or something because the patient has missed several days in the past and the patient need to give it to them this week if she need to bring another FMLA paper back to be filled out she will please respond

## 2016-12-17 NOTE — Telephone Encounter (Signed)
She need to get further advisement from the orthopedics.  She had an appointment last week. I've already completed her FMLA.

## 2016-12-19 NOTE — Telephone Encounter (Signed)
l/m with clarks response

## 2017-01-29 ENCOUNTER — Encounter: Payer: Self-pay | Admitting: Gynecology

## 2017-01-29 ENCOUNTER — Ambulatory Visit (INDEPENDENT_AMBULATORY_CARE_PROVIDER_SITE_OTHER): Payer: BLUE CROSS/BLUE SHIELD | Admitting: Orthopaedic Surgery

## 2017-02-03 ENCOUNTER — Other Ambulatory Visit: Payer: Self-pay | Admitting: Internal Medicine

## 2017-02-03 DIAGNOSIS — E2839 Other primary ovarian failure: Secondary | ICD-10-CM

## 2017-02-11 ENCOUNTER — Ambulatory Visit (INDEPENDENT_AMBULATORY_CARE_PROVIDER_SITE_OTHER): Payer: BLUE CROSS/BLUE SHIELD | Admitting: Orthopaedic Surgery

## 2017-02-18 ENCOUNTER — Other Ambulatory Visit: Payer: BLUE CROSS/BLUE SHIELD

## 2017-02-19 ENCOUNTER — Other Ambulatory Visit (INDEPENDENT_AMBULATORY_CARE_PROVIDER_SITE_OTHER): Payer: Self-pay

## 2017-02-19 ENCOUNTER — Ambulatory Visit (INDEPENDENT_AMBULATORY_CARE_PROVIDER_SITE_OTHER): Payer: BLUE CROSS/BLUE SHIELD | Admitting: Orthopaedic Surgery

## 2017-02-19 ENCOUNTER — Encounter (INDEPENDENT_AMBULATORY_CARE_PROVIDER_SITE_OTHER): Payer: Self-pay

## 2017-02-19 DIAGNOSIS — M25561 Pain in right knee: Secondary | ICD-10-CM

## 2017-02-19 DIAGNOSIS — G8929 Other chronic pain: Secondary | ICD-10-CM | POA: Diagnosis not present

## 2017-02-19 NOTE — Progress Notes (Signed)
Office Visit Note   Patient: Marie Tate           Date of Birth: 05-27-1961           MRN: [id number] Visit Date: 02/19/2017              Requested by: Nolene Ebbs, MD [id number] [place], [place] [address] PCP: Patient, No Pcp Per   Assessment & Plan: Visit Diagnoses:  1. Chronic pain of right knee     Plan: At this point I do feel an MRI is medically and clinically warranted given the patient's right knee issues. She's having instability symptoms and continued pain and swelling with her right knee. She has tried and failed all forms of conservative treatment including rest, ice, heat, anti-inflammatories, quad training exercises and steroid injections. She is now been out of work since April and is becoming frustrating for her. We will see her back after the MRI. I showed her a knee model and explained in detail what I feel is going on with her knee in 1 MRI will be helpful for [place] to determine what the next step is for her.  Follow-Up Instructions: Return in about 2 weeks (around 03/05/2017).   Orders:  No orders of the defined types were placed in this encounter.  No orders of the defined types were placed in this encounter.     Procedures: No procedures performed   Clinical Data: No additional findings.   Subjective: No chief complaint on file. The patient is a 56 year old female who I'm seeing for the first time due to her right knee. She's been having 6 months or more worth of knee locking and catching and is getting worse. She is having trouble going up and downstairs and is been out of work since April. She feels like the knee is giving out on her as well. She's already tried activity modification, ice, heat, anti-inflammatories, rest, quad training exercises and a steroid injection in her knee. No this is helped. Her knee swells quite a bit as well she reports. This pain and instability symptoms of now detrimentally affected her activity is daily living, her  quality of life, and her mobility.  HPI  Review of Systems She denies any headache, chest pain, shortness of breath, fever, chills, nausea, vomiting.  Objective: Vital Signs: LMP 06/03/2016 (Approximate)   Physical Exam She is alert and 3 and in no acute distress but she does walk with a limp Ortho Exam Examination of her right knee shows it hyperextends. Range of motion is full other than the hyperextension. She has medial joint line tenderness and a positive Murray sign of the medial side. She has severe and significant patellofemoral crepitation. There is a mild effusion as well. Specialty Comments:  No specialty comments available.  Imaging: No results found. X-rays on the canopy system mandibular reviewed by me show mild arthritic changes throughout the knee  PMFS History: Patient Active Problem List   Diagnosis Date Noted  . Bacterial vaginosis 07/09/2016  . Epigastric pain 07/09/2016  . Dysuria 07/09/2016  . Hiatal hernia 07/09/2016  . GERD (gastroesophageal reflux disease) 07/09/2016   Past Medical History:  Diagnosis Date  . Hypertension     Family History  Problem Relation Age of Onset  . Heart attack Mother   . Stroke Father     Past Surgical History:  Procedure Laterality Date  . UTERINE FIBROID SURGERY     Social History   Occupational History  . Not on  file.   Social History Main Topics  . Smoking status: Former Research scientist (life sciences)  . Smokeless tobacco: Never Used  . Alcohol use No     Comment: occ  . Drug use: No  . Sexual activity: Yes    Birth control/ protection: Surgical

## 2017-02-20 ENCOUNTER — Other Ambulatory Visit (INDEPENDENT_AMBULATORY_CARE_PROVIDER_SITE_OTHER): Payer: Self-pay

## 2017-03-10 ENCOUNTER — Ambulatory Visit (INDEPENDENT_AMBULATORY_CARE_PROVIDER_SITE_OTHER): Payer: BLUE CROSS/BLUE SHIELD | Admitting: Orthopaedic Surgery

## 2017-03-14 ENCOUNTER — Other Ambulatory Visit: Payer: BLUE CROSS/BLUE SHIELD

## 2017-03-18 ENCOUNTER — Ambulatory Visit (INDEPENDENT_AMBULATORY_CARE_PROVIDER_SITE_OTHER): Payer: BLUE CROSS/BLUE SHIELD | Admitting: Orthopaedic Surgery

## 2017-03-23 ENCOUNTER — Ambulatory Visit
Admission: RE | Admit: 2017-03-23 | Discharge: 2017-03-23 | Disposition: A | Discharge status: Home / Self Care | Payer: BLUE CROSS/BLUE SHIELD | Source: Ambulatory Visit | Attending: Orthopaedic Surgery | Admitting: Orthopaedic Surgery

## 2017-03-23 DIAGNOSIS — G8929 Other chronic pain: Secondary | ICD-10-CM

## 2017-03-23 DIAGNOSIS — M25561 Pain in right knee: Principal | ICD-10-CM

## 2017-04-02 ENCOUNTER — Encounter (INDEPENDENT_AMBULATORY_CARE_PROVIDER_SITE_OTHER): Payer: Self-pay | Admitting: Orthopaedic Surgery

## 2017-04-02 ENCOUNTER — Ambulatory Visit (INDEPENDENT_AMBULATORY_CARE_PROVIDER_SITE_OTHER): Payer: BLUE CROSS/BLUE SHIELD | Admitting: Orthopaedic Surgery

## 2017-04-02 DIAGNOSIS — M25561 Pain in right knee: Secondary | ICD-10-CM | POA: Diagnosis not present

## 2017-04-02 DIAGNOSIS — M1711 Unilateral primary osteoarthritis, right knee: Secondary | ICD-10-CM | POA: Diagnosis not present

## 2017-04-02 DIAGNOSIS — G8929 Other chronic pain: Secondary | ICD-10-CM | POA: Diagnosis not present

## 2017-04-02 NOTE — Progress Notes (Signed)
   Office Visit Note   Patient: Marie Tate           Date of Birth: [birth year]           MRN: [id number] Visit Date: 04/02/2017              Requested by: No referring provider defined for this encounter. PCP: Patient, No Pcp Per   Assessment & Plan: Visit Diagnoses:  1. Chronic pain of right knee   2. Unilateral primary osteoarthritis, right knee     Plan: We went over the MRI of her right knee and I talked to her about trying hyaluronic acid injection to treat the pain from the mild to moderate arthritic changes. She is agreeable to this. I gave her handout about as well. I did talk about trying a steroid injection in her knee today since she last had one back in January that did help. She is agreeable to this as well. We'll see her back in 4 weeks' no Foley Can [address]., Synvisc 1 in her knee at that visit.  Follow-Up Instructions: Return in about 4 weeks (around 04/30/2017).   Orders:  No orders of the defined types were placed in this encounter.  No orders of the defined types were placed in this encounter.     Procedures: No procedures performed   Clinical Data: No additional findings.   Subjective: Chief Complaint  Patient presents with  . Right Knee - Pain, Follow-up    HPI  Review of Systems She denies any fever, chills, chest pain, nausea vomiting  Objective: Vital Signs: LMP 06/03/2016 (Approximate)   Physical Exam She is alert and oriented 3 in no acute distress Ortho Exam Examination of her right knee shows medial joint line tenderness and patellofemoral crepitation. The knee is ligaments stable. There is a mild effusion. Specialty Comments:  No specialty comments available.  Imaging: No results found. The MRI is reviewed independently with her. It does show mild-to-moderate arthritic changes in the patellofemoral joint and the medial compartment of her knee. There is no meniscal tear. The collateral ligaments are intact.  PMFS History: Patient  Active Problem List   Diagnosis Date Noted  . Chronic pain of right knee 04/02/2017  . Unilateral primary osteoarthritis, right knee 04/02/2017  . Bacterial vaginosis 07/09/2016  . Epigastric pain 07/09/2016  . Dysuria 07/09/2016  . Hiatal hernia 07/09/2016  . GERD (gastroesophageal reflux disease) 07/09/2016   Past Medical History:  Diagnosis Date  . Hypertension     Family History  Problem Relation Age of Onset  . Heart attack Mother   . Stroke Father     Past Surgical History:  Procedure Laterality Date  . UTERINE FIBROID SURGERY     Social History   Occupational History  . Not on file.   Social History Main Topics  . Smoking status: Former Research scientist (life sciences)  . Smokeless tobacco: Never Used  . Alcohol use No     Comment: occ  . Drug use: No  . Sexual activity: Yes    Birth control/ protection: Surgical

## 2017-04-03 ENCOUNTER — Telehealth (INDEPENDENT_AMBULATORY_CARE_PROVIDER_SITE_OTHER): Payer: Self-pay | Admitting: Radiology

## 2017-04-03 NOTE — Telephone Encounter (Signed)
Submitted online for Synvisc One VOB, please f/u, thanks.

## 2017-05-08 ENCOUNTER — Ambulatory Visit (INDEPENDENT_AMBULATORY_CARE_PROVIDER_SITE_OTHER): Payer: BLUE CROSS/BLUE SHIELD | Admitting: Orthopaedic Surgery

## 2017-05-26 ENCOUNTER — Ambulatory Visit (INDEPENDENT_AMBULATORY_CARE_PROVIDER_SITE_OTHER): Payer: BLUE CROSS/BLUE SHIELD | Admitting: Physician Assistant

## 2017-05-26 DIAGNOSIS — M1711 Unilateral primary osteoarthritis, right knee: Secondary | ICD-10-CM | POA: Diagnosis not present

## 2017-05-26 NOTE — Progress Notes (Signed)
   Office Visit Note   Patient: Marie Tate           Date of Birth: 16-Aug-1961           MRN: [id number] Visit Date: 05/26/2017              Requested by: No referring provider defined for this encounter. PCP: Patient, No Pcp Per   Assessment & Plan: Visit Diagnoses:  1. Unilateral primary osteoarthritis, right knee     Plan: Continue to work on Forensic scientist. Discussed with her natural anti-inflammatories. Also discussed with her that she can have cortisone injections every 3 months and supplemental injections and no more often than every 6 months  Follow-Up Instructions: Return if symptoms worsen or fail to improve.   Orders:  No orders of the defined types were placed in this encounter.  No orders of the defined types were placed in this encounter.     Procedures: No procedures performed   Clinical Data: No additional findings.   Subjective: Chief Complaint  Patient presents with  . Right Knee - Follow-up    HPI Status returns today follow-up of her right knee. She states that a supplemental injection was given Ancef on 04/02/2017 by Dr. Ninfa Linden and reports that the injection really didn't help with her knee. Still having swelling in the knee. Swelling is mostly in the popliteal region of the knee. Review of Systems   Objective: Vital Signs: LMP 06/03/2016 (Approximate)   Physical Exam  Constitutional: She is oriented to person, place, and time. She appears well-developed and well-nourished. No distress.  Neurological: She is alert and oriented to person, place, and time.  Skin: She is not diaphoretic.  Psychiatric: She has a normal mood and affect.    Ortho Exam Right knee full extension flexion to 120. No instability valgus varus stressing. No effusion abnormal warmth. Tenderness along medial joint line only. Crepitus with passive range of motion. Right calf supple nontender. Specialty Comments:  No specialty comments available.  Imaging: No  results found.   PMFS History: Patient Active Problem List   Diagnosis Date Noted  . Chronic pain of right knee 04/02/2017  . Unilateral primary osteoarthritis, right knee 04/02/2017  . Bacterial vaginosis 07/09/2016  . Epigastric pain 07/09/2016  . Dysuria 07/09/2016  . Hiatal hernia 07/09/2016  . GERD (gastroesophageal reflux disease) 07/09/2016   Past Medical History:  Diagnosis Date  . Hypertension     Family History  Problem Relation Age of Onset  . Heart attack Mother   . Stroke Father     Past Surgical History:  Procedure Laterality Date  . UTERINE FIBROID SURGERY     Social History   Occupational History  . Not on file.   Social History Main Topics  . Smoking status: Former Research scientist (life sciences)  . Smokeless tobacco: Never Used  . Alcohol use No     Comment: occ  . Drug use: No  . Sexual activity: Yes    Birth control/ protection: Surgical

## 2017-08-25 ENCOUNTER — Ambulatory Visit (INDEPENDENT_AMBULATORY_CARE_PROVIDER_SITE_OTHER): Payer: BLUE CROSS/BLUE SHIELD | Admitting: Orthopaedic Surgery

## 2017-09-17 ENCOUNTER — Encounter (INDEPENDENT_AMBULATORY_CARE_PROVIDER_SITE_OTHER): Payer: Self-pay | Admitting: Orthopaedic Surgery

## 2017-09-17 ENCOUNTER — Ambulatory Visit (INDEPENDENT_AMBULATORY_CARE_PROVIDER_SITE_OTHER): Payer: BLUE CROSS/BLUE SHIELD | Admitting: Orthopaedic Surgery

## 2017-09-17 DIAGNOSIS — G8929 Other chronic pain: Secondary | ICD-10-CM

## 2017-09-17 DIAGNOSIS — M1711 Unilateral primary osteoarthritis, right knee: Secondary | ICD-10-CM

## 2017-09-17 DIAGNOSIS — M25561 Pain in right knee: Secondary | ICD-10-CM

## 2017-09-17 MED ORDER — NABUMETONE 500 MG PO TABS: 500.0000 mg | ORAL_TABLET | Freq: Two times a day (BID) | ORAL | 1 refills | Status: DC | PRN

## 2017-09-17 MED ORDER — LIDOCAINE HCL 1 % IJ SOLN
3.0000 mL | INTRAMUSCULAR | Status: AC | PRN
  Administered 2017-09-17: 3 mL

## 2017-09-17 MED ORDER — METHYLPREDNISOLONE ACETATE 40 MG/ML IJ SUSP
40.0000 mg | INTRAMUSCULAR | Status: AC | PRN
  Administered 2017-09-17: 40 mg via INTRA_ARTICULAR

## 2017-09-17 NOTE — Progress Notes (Signed)
Office Visit Note   Patient: Marie Tate           Date of Birth: [birth year]           MRN: [id number] Visit Date: 09/17/2017              Requested by: No referring provider defined for this encounter. PCP: Patient, No Pcp Per   Assessment & Plan: Visit Diagnoses:  1. Unilateral primary osteoarthritis, right knee   2. Chronic pain of right knee     Plan: I do feel that she would benefit from steroid injection of her right knee today and then a month later a Synvisc 1 injection or hyaluronic acid injection that knee.  She also benefit from outpatient physical therapy to work on quad strengthening exercises for the right lower extremity as well as other modalities such as stretching and any types of things such as iontophoresis phonophoresis or even dry needling to work on her IT band and her trochanteric area.  All questions and concerns were answered and addressed.  She tolerated steroid injection well.  We will see her back in 4 weeks see how she is doing overall and see if therapy is helping to provide a hyaluronic acid injection into her right knee.  I will also send in some Relafen as an anti-inflammatory to her pharmacy.  Follow-Up Instructions: Return in about 4 weeks (around 10/15/2017).   Orders:  Orders Placed This Encounter  Procedures  . Large Joint Inj   Meds ordered this encounter  Medications  . nabumetone (RELAFEN) 500 MG tablet    Sig: Take 1 tablet (500 mg total) by mouth 2 (two) times daily as needed.    Dispense:  60 tablet    Refill:  1      Procedures: Large Joint Inj: R knee on 09/17/2017 1:29 PM Indications: diagnostic evaluation and pain Details: 22 G 1.5 in needle, superolateral approach  Arthrogram: No  Medications: 3 mL lidocaine 1 %; 40 mg methylPREDNISolone acetate 40 MG/ML Outcome: tolerated well, no immediate complications Procedure, treatment alternatives, risks and benefits explained, specific risks discussed. Consent was given by the  patient. Immediately prior to procedure a time out was called to verify the correct patient, procedure, equipment, support staff and site/side marked as required. Patient was prepped and draped in the usual sterile fashion.       Clinical Data: No additional findings.   Subjective: Chief Complaint  Patient presents with  . Right Knee - Pain  The patient comes in now today with chronic right knee pain with an MRI of her knee in July showing significant tricompartmental arthritic changes in that knee.  It is now affecting her right hip in terms of bursitis and IT band syndrome and pain around her right hip.  She is walking with a limp she is very stiff in the morning having significant amount of pain in general.  This involves her quite a bit and is becoming significantly disabling for her.  HPI  Review of Systems She denies any headache, chest pain, shortness of breath, fever, chills, nausea, vomiting.  Objective: Vital Signs: LMP 06/03/2016 (Approximate)   Physical Exam She is alert and oriented x3 in no acute distress Ortho Exam Examination of her right lower extremity shows no effusion over the right knee with global tenderness.  The knee is really mostly stable with full range of motion but painful.  She has stiffness in the IT band in the trochanteric area on the  right hip no pain in the groin with painful range of motion. Specialty Comments:  No specialty comments available.  Imaging: No results found.   PMFS History: Patient Active Problem List   Diagnosis Date Noted  . Chronic pain of right knee 04/02/2017  . Unilateral primary osteoarthritis, right knee 04/02/2017  . Bacterial vaginosis 07/09/2016  . Epigastric pain 07/09/2016  . Dysuria 07/09/2016  . Hiatal hernia 07/09/2016  . GERD (gastroesophageal reflux disease) 07/09/2016   Past Medical History:  Diagnosis Date  . Hypertension     Family History  Problem Relation Age of Onset  . Heart attack Mother     . Stroke Father     Past Surgical History:  Procedure Laterality Date  . UTERINE FIBROID SURGERY     Social History   Occupational History  . Not on file  Tobacco Use  . Smoking status: Former Research scientist (life sciences)  . Smokeless tobacco: Never Used  Substance and Sexual Activity  . Alcohol use: No    Comment: occ  . Drug use: No  . Sexual activity: Yes    Birth control/protection: Surgical

## 2017-09-19 ENCOUNTER — Other Ambulatory Visit (INDEPENDENT_AMBULATORY_CARE_PROVIDER_SITE_OTHER): Payer: Self-pay

## 2017-09-19 DIAGNOSIS — M25551 Pain in right hip: Secondary | ICD-10-CM

## 2017-10-14 ENCOUNTER — Other Ambulatory Visit (INDEPENDENT_AMBULATORY_CARE_PROVIDER_SITE_OTHER): Payer: Self-pay

## 2017-10-14 MED ORDER — NABUMETONE 500 MG PO TABS: 500.0000 mg | ORAL_TABLET | Freq: Two times a day (BID) | ORAL | 1 refills | Status: DC | PRN

## 2017-10-22 ENCOUNTER — Ambulatory Visit (INDEPENDENT_AMBULATORY_CARE_PROVIDER_SITE_OTHER): Payer: BLUE CROSS/BLUE SHIELD | Admitting: Orthopaedic Surgery

## 2017-10-29 ENCOUNTER — Ambulatory Visit (INDEPENDENT_AMBULATORY_CARE_PROVIDER_SITE_OTHER): Payer: BLUE CROSS/BLUE SHIELD | Admitting: Orthopaedic Surgery

## 2017-12-08 ENCOUNTER — Other Ambulatory Visit: Payer: Self-pay | Admitting: Gastroenterology

## 2017-12-08 DIAGNOSIS — K769 Liver disease, unspecified: Secondary | ICD-10-CM

## 2017-12-25 ENCOUNTER — Ambulatory Visit
Admission: RE | Admit: 2017-12-25 | Discharge: 2017-12-25 | Disposition: A | Discharge status: Home / Self Care | Payer: BLUE CROSS/BLUE SHIELD | Source: Ambulatory Visit | Attending: Gastroenterology | Admitting: Gastroenterology

## 2017-12-25 DIAGNOSIS — K769 Liver disease, unspecified: Secondary | ICD-10-CM

## 2017-12-25 MED ORDER — GADOBENATE DIMEGLUMINE 529 MG/ML IV SOLN
20.0000 mL | Freq: Once | INTRAVENOUS | Status: AC | PRN
  Administered 2017-12-25: 20 mL via INTRAVENOUS

## 2018-03-13 IMAGING — DX DG CHEST 2V
2 series · 2 of 2 positions shown · non-contrast
Comparison: 08/16/2015

CLINICAL DATA: Epigastric and chest pain for 2 days.

EXAM:
CHEST  2 VIEW

[chest pa]
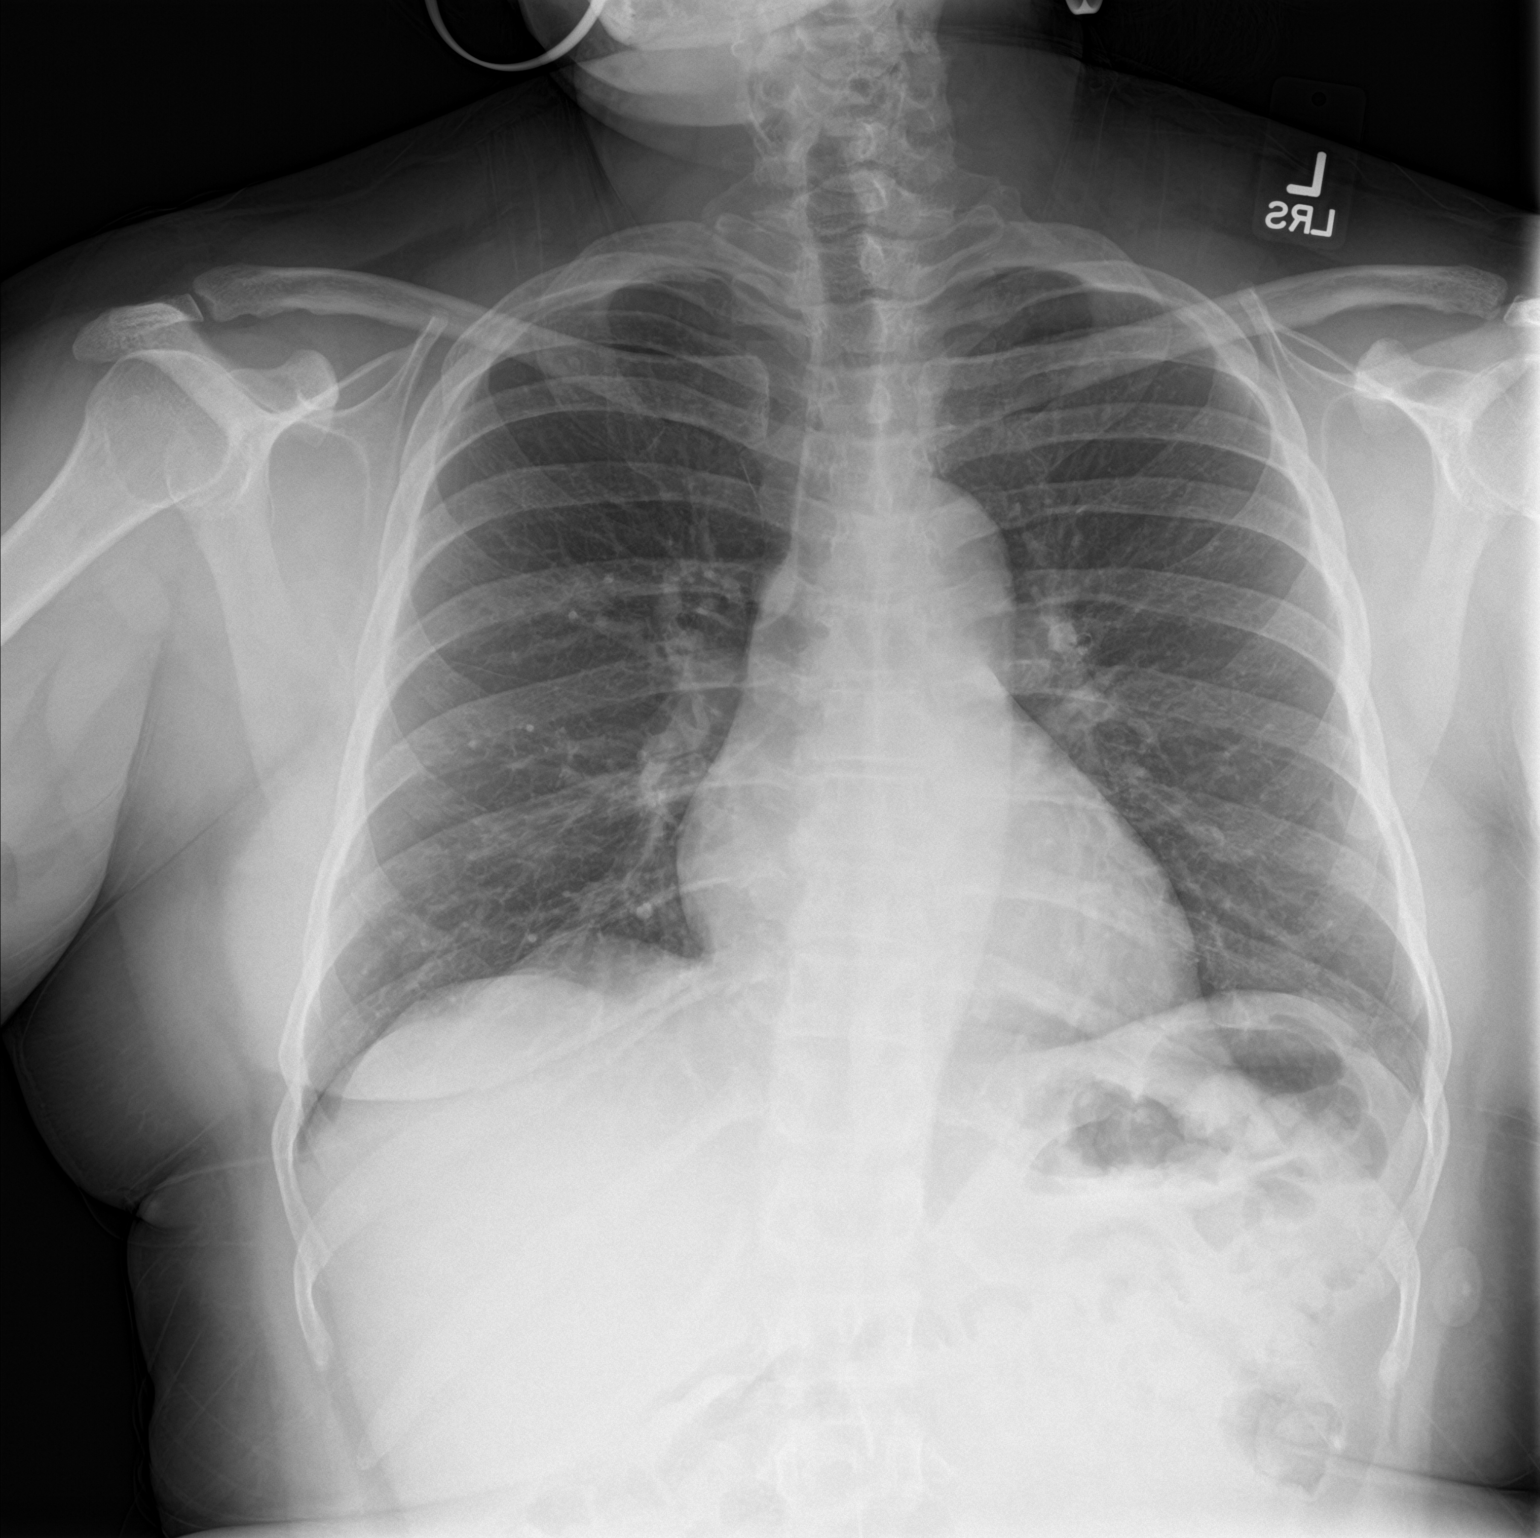

[chest lat]
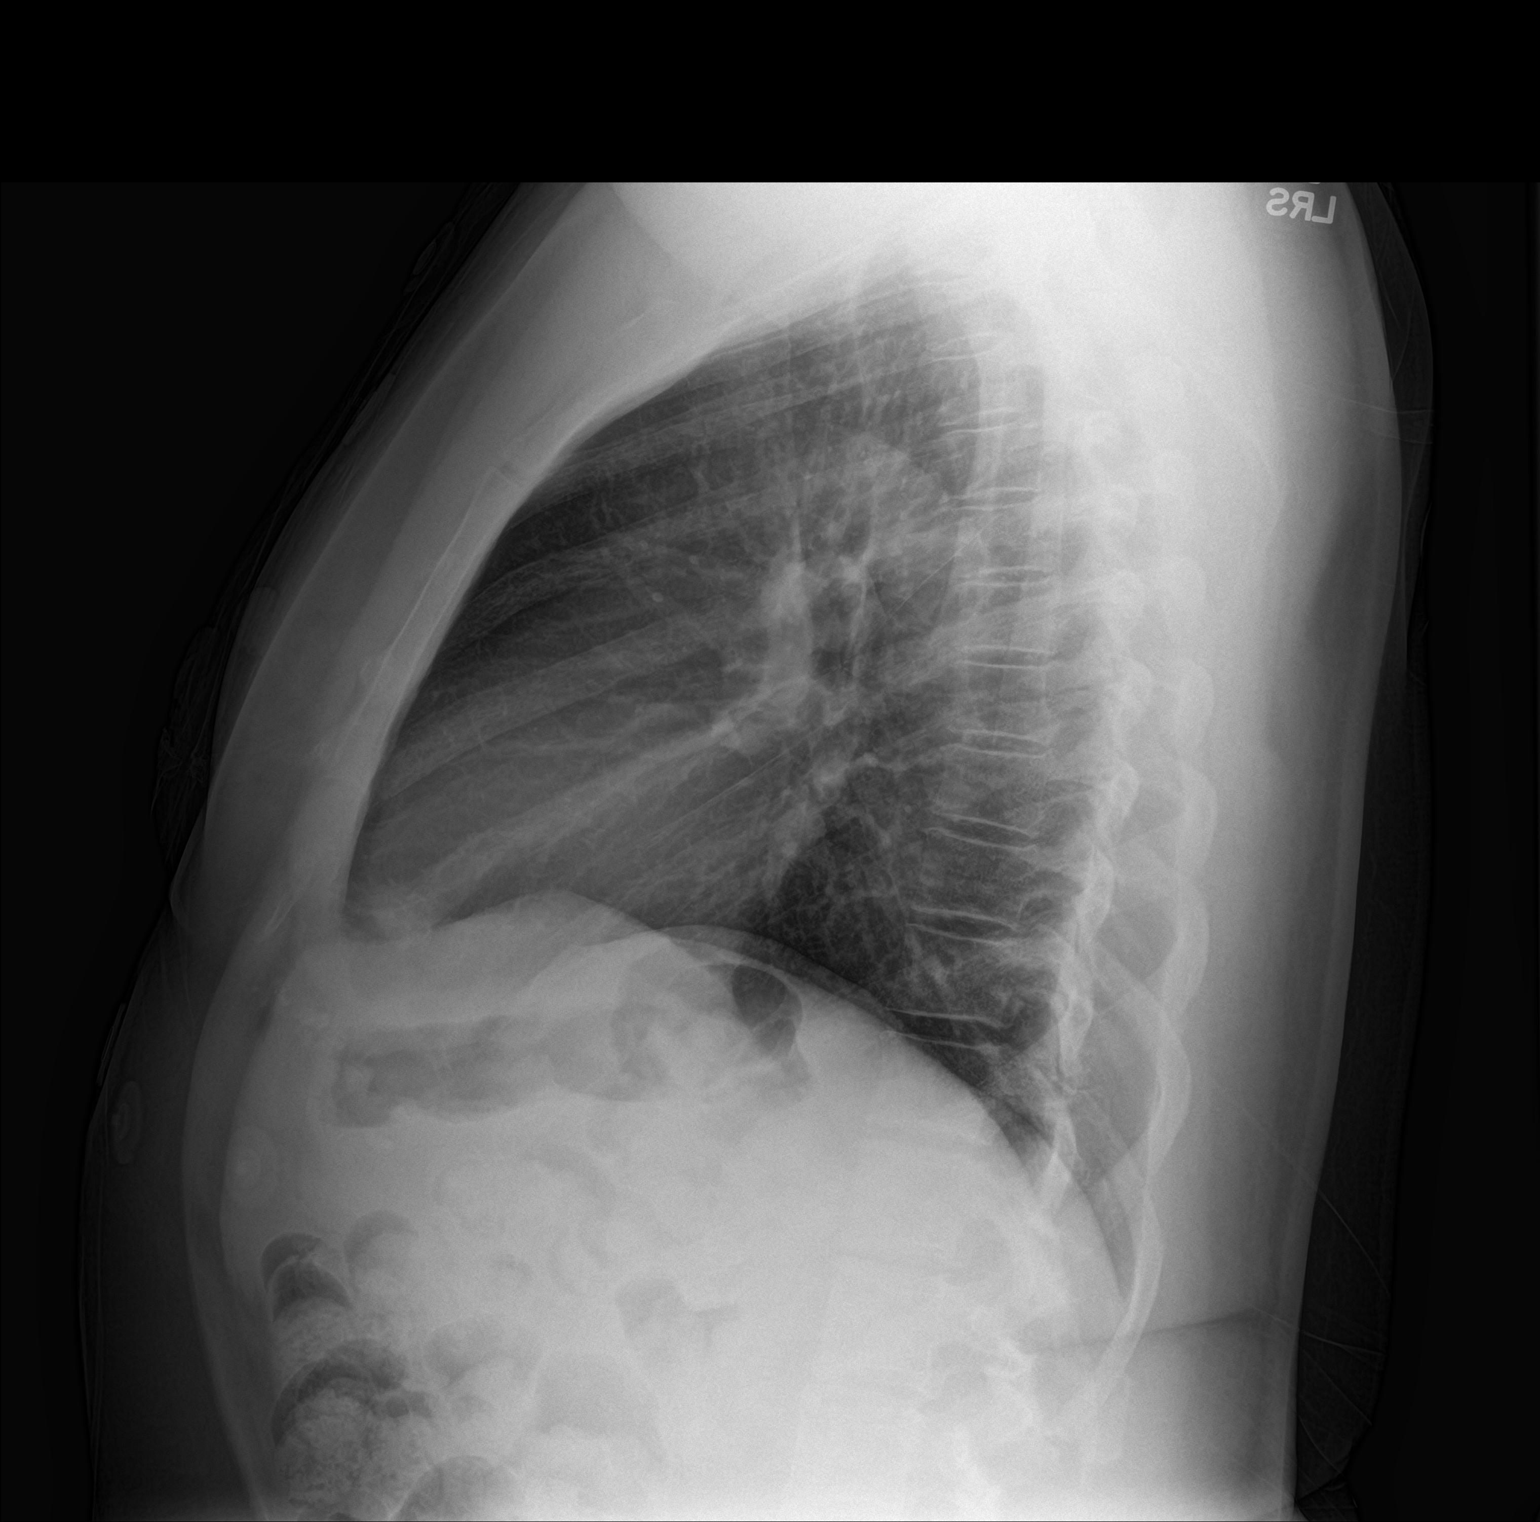

[2 of 2 positions shown; findings below may reference images not displayed]

FINDINGS: Normal heart size and pulmonary vascularity. No focal airspace
disease or consolidation in the lungs. No blunting of costophrenic
angles. No pneumothorax. Mediastinal contours appear intact. Small
cervical rib on the left.
IMPRESSION: No active cardiopulmonary disease.

## 2018-04-13 ENCOUNTER — Ambulatory Visit (INDEPENDENT_AMBULATORY_CARE_PROVIDER_SITE_OTHER): Payer: BLUE CROSS/BLUE SHIELD | Admitting: Physician Assistant

## 2018-04-18 ENCOUNTER — Ambulatory Visit (HOSPITAL_COMMUNITY)
Admission: EM | Admit: 2018-04-18 | Discharge: 2018-04-18 | Disposition: A | Discharge status: Home / Self Care | Payer: BLUE CROSS/BLUE SHIELD | Attending: Family Medicine | Admitting: Family Medicine

## 2018-04-18 ENCOUNTER — Encounter (HOSPITAL_COMMUNITY): Payer: Self-pay | Admitting: Emergency Medicine

## 2018-04-18 DIAGNOSIS — M25532 Pain in left wrist: Secondary | ICD-10-CM

## 2018-04-18 DIAGNOSIS — I10 Essential (primary) hypertension: Secondary | ICD-10-CM | POA: Diagnosis not present

## 2018-04-18 MED ORDER — DICLOFENAC SODIUM 75 MG PO TBEC: 75.0000 mg | DELAYED_RELEASE_TABLET | Freq: Two times a day (BID) | ORAL | 0 refills | Status: DC

## 2018-04-18 NOTE — ED Notes (Signed)
Pt discharged by provider.

## 2018-04-18 NOTE — ED Triage Notes (Signed)
Pt c/o sharp pains in her L wrist for a couple days. Pt tried to take Icy hot on her L forearm it eased off but it came back. Denies CP or SOB.

## 2018-04-18 NOTE — Discharge Instructions (Addendum)
I would recommend seeing an orthopaedist if you are not improving over the next week.

## 2018-04-20 NOTE — ED Provider Notes (Signed)
[facility]   [id number] 04/18/18 Arrival Time: 2409  ASSESSMENT & PLAN:  1. Left wrist pain     Meds ordered this encounter  Medications  . diclofenac (VOLTAREN) 75 MG EC tablet    Sig: Take 1 tablet (75 mg total) by mouth 2 (two) times daily.    Dispense:  14 tablet    Refill:  0   No wrist splint yet. Will try NSAID first. Limit overuse as she is able. Will f/u if not seeing improvement over the next week, sooner if needed.  Reviewed expectations re: course of current medical issues. Questions answered. Outlined signs and symptoms indicating need for more acute intervention. Patient verbalized understanding. After Visit Summary given.  SUBJECTIVE: History from: patient. Marie Tate is a 57 y.o. female who reports intermittent mild pain of her left wrist that is stable; described as aching but with sharp exacerbations without radiation. Onset: gradual, over the past few days. Injury/trama: no. Relieved by: nothing in particular. Worsened by: certain movements; gripping. Associated symptoms: none reported. Extremity sensation changes or weakness: none. Self treatment: has not tried OTCs for relief of pain. History of similar: no  ROS: As per HPI.   OBJECTIVE:  Vitals:   04/18/18 1715  BP: (!) 148/61  Pulse: 71  Resp: 16  Temp: 98 F (36.7 C)  SpO2: 100%    General appearance: alert; no distress Extremities: warm and well perfused; symmetrical with no gross deformities; vague soreness reported with L wrist palpation and exam with no swelling and no bruising; ROM: normal but with discomfort CV: brisk extremity capillary refill Skin: warm and dry Neurologic: normal gait; normal symmetric reflexes in all extremities; normal sensation in all extremities Psychological: alert and cooperative; normal mood and affect  No Known Allergies  Past Medical History:  Diagnosis Date  . Hypertension    Social History   Socioeconomic History  . Marital  status: Married    Spouse name: Not on file  . Number of children: Not on file  . Years of education: Not on file  . Highest education level: Not on file  Occupational History  . Not on file  Social Needs  . Financial resource strain: Not on file  . Food insecurity:    Worry: Not on file    Inability: Not on file  . Transportation needs:    Medical: Not on file    Non-medical: Not on file  Tobacco Use  . Smoking status: Former Research scientist (life sciences)  . Smokeless tobacco: Never Used  Substance and Sexual Activity  . Alcohol use: No    Comment: occ  . Drug use: No  . Sexual activity: Yes    Birth control/protection: Surgical  Lifestyle  . Physical activity:    Days per week: Not on file    Minutes per session: Not on file  . Stress: Not on file  Relationships  . Social connections:    Talks on phone: Not on file    Gets together: Not on file    Attends religious service: Not on file    Active member of club or organization: Not on file    Attends meetings of clubs or organizations: Not on file    Relationship status: Not on file  . Intimate partner violence:    Fear of current or ex partner: Not on file    Emotionally abused: Not on file    Physically abused: Not on file    Forced sexual activity: Not on file  Other Topics Concern  . Not on file  Social History Narrative  . Not on file   Family History  Problem Relation Age of Onset  . Heart attack Mother   . Stroke Father    Past Surgical History:  Procedure Laterality Date  . UTERINE FIBROID SURGERY        Vanessa Kick, MD 04/28/18 [phone]

## 2019-03-24 ENCOUNTER — Telehealth: Payer: Self-pay | Admitting: General Practice

## 2019-03-24 ENCOUNTER — Ambulatory Visit: Payer: Self-pay | Admitting: *Deleted

## 2019-03-24 NOTE — Telephone Encounter (Signed)
Patient returned call.  She was given info on where to go for COVID-19 testing without having a PCP. 1) [facility]  2) [facility]

## 2019-03-24 NOTE — Telephone Encounter (Signed)
Message from Jodie Echevaria sent at 03/24/2019 3:08 PM EDT  Patient called to say that she have been having abdominal issues with diarrhea 2nd day, and headaches for 3 days. Requesting information on getting tested for Covid Ph# [phone]    Pt called and left message to return call. If pt does not have a PCP, the patient can contact the local health department to request testing. Pt could also go to [email or web address] that has a list of pop up testing sites based on location.

## 2019-04-30 ENCOUNTER — Other Ambulatory Visit: Payer: Self-pay

## 2019-04-30 DIAGNOSIS — Z20822 Contact with and (suspected) exposure to covid-19: Secondary | ICD-10-CM

## 2019-05-02 LAB — NOVEL CORONAVIRUS, NAA: SARS-CoV-2, NAA: NOT DETECTED

## 2019-09-21 LAB — HM PAP SMEAR

## 2019-11-18 ENCOUNTER — Encounter (HOSPITAL_COMMUNITY): Payer: Self-pay | Admitting: Emergency Medicine

## 2019-11-18 ENCOUNTER — Emergency Department (HOSPITAL_COMMUNITY)
Admission: EM | Admit: 2019-11-18 | Discharge: 2019-11-18 | Disposition: A | Discharge status: Home / Self Care | Payer: BC Managed Care – PPO | Attending: Emergency Medicine | Admitting: Emergency Medicine

## 2019-11-18 ENCOUNTER — Emergency Department (HOSPITAL_COMMUNITY): Payer: BC Managed Care – PPO

## 2019-11-18 ENCOUNTER — Other Ambulatory Visit: Payer: Self-pay

## 2019-11-18 DIAGNOSIS — Z79899 Other long term (current) drug therapy: Secondary | ICD-10-CM | POA: Insufficient documentation

## 2019-11-18 DIAGNOSIS — M25512 Pain in left shoulder: Secondary | ICD-10-CM | POA: Insufficient documentation

## 2019-11-18 DIAGNOSIS — R1013 Epigastric pain: Secondary | ICD-10-CM | POA: Insufficient documentation

## 2019-11-18 DIAGNOSIS — I10 Essential (primary) hypertension: Secondary | ICD-10-CM | POA: Insufficient documentation

## 2019-11-18 DIAGNOSIS — Z87891 Personal history of nicotine dependence: Secondary | ICD-10-CM | POA: Insufficient documentation

## 2019-11-18 HISTORY — DX: Fatty (change of) liver, not elsewhere classified: K76.0

## 2019-11-18 HISTORY — DX: Gastro-esophageal reflux disease without esophagitis: K21.9

## 2019-11-18 MED ORDER — MELOXICAM 7.5 MG PO TABS: 7.5000 mg | ORAL_TABLET | Freq: Every day | ORAL | 0 refills | Status: AC

## 2019-11-18 MED ORDER — FAMOTIDINE 20 MG PO TABS: 20.0000 mg | ORAL_TABLET | Freq: Every day | ORAL | 0 refills | Status: DC

## 2019-11-18 MED ORDER — ALUM & MAG HYDROXIDE-SIMETH 200-200-20 MG/5ML PO SUSP
30.0000 mL | Freq: Once | ORAL | Status: AC
  Administered 2019-11-18: 30 mL via ORAL
  Filled 2019-11-18: qty 30

## 2019-11-18 MED ORDER — MELOXICAM 7.5 MG PO TABS: 7.5000 mg | ORAL_TABLET | Freq: Every day | ORAL | 0 refills | Status: DC

## 2019-11-18 NOTE — ED Notes (Signed)
Called for triage x 3 with no response

## 2019-11-18 NOTE — ED Notes (Signed)
Patient verbalizes understanding of discharge instructions. Opportunity for questioning and answers were provided. Armband removed by staff, pt discharged from ED ambulatory.   

## 2019-11-18 NOTE — ED Triage Notes (Signed)
Patient c/o stiffness to her left arm/ shoulder and pain started yesterday. Pain worse with movement.

## 2019-11-18 NOTE — Discharge Instructions (Signed)
Take meloxicam and Pepcid daily as prescribed.  Please follow-up with orthopedics regarding your left shoulder pain within 1 week.  Return to the emergency room as needed for new or worsening symptoms or concerns, such as chest pain, shortness of breath, fevers, vomiting or any concerns at all.

## 2019-11-18 NOTE — ED Provider Notes (Signed)
[facility] Provider Note   CSN: [id number] Arrival date & time: 11/18/19  1756     History Chief Complaint  Patient presents with  . Shoulder Pain    Marie Tate is a 59 y.o. female.  HPI  59 year old female presents with complaints of left shoulder pain.  Patient states pain has been gradually worsening over the last week.  She notes pain is worse with turning her head or lifting her arm.  Since waiting in the ER she started having some epigastric discomfort which she described as "indigestion."  She states that her stomach is upset but denies any nausea.  She denies any vomiting.  She denies any chest pain, shortness of breath.  She denies any trauma or injury to her shoulder.  She denies any numbness, tingling.     Past Medical History:  Diagnosis Date  . Acid reflux   . Fatty liver   . Hypertension     Patient Active Problem List   Diagnosis Date Noted  . Chronic pain of right knee 04/02/2017  . Unilateral primary osteoarthritis, right knee 04/02/2017  . Bacterial vaginosis 07/09/2016  . Epigastric pain 07/09/2016  . Dysuria 07/09/2016  . Hiatal hernia 07/09/2016  . GERD (gastroesophageal reflux disease) 07/09/2016    Past Surgical History:  Procedure Laterality Date  . UTERINE FIBROID SURGERY       OB History   No obstetric history on file.     Family History  Problem Relation Age of Onset  . Heart attack Mother   . Stroke Father     Social History   Tobacco Use  . Smoking status: Former Research scientist (life sciences)  . Smokeless tobacco: Never Used  Substance Use Topics  . Alcohol use: No    Comment: occ  . Drug use: No    Home Medications Prior to Admission medications   Medication Sig Start Date End Date Taking? Authorizing Provider  diclofenac (VOLTAREN) 75 MG EC tablet Take 1 tablet (75 mg total) by mouth 2 (two) times daily. 04/18/18   Vanessa Kick, MD  famotidine (PEPCID) 20 MG tablet Take 1 tablet (20 mg total) by  mouth daily. 11/18/19   Torie Priebe S, PA-C  lisinopril-hydrochlorothiazide (PRINZIDE,ZESTORETIC) 10-12.5 MG tablet Take 1 tablet by mouth daily. 02/27/16   [provider]  Magnesium 250 MG TABS Take 250 mg by mouth daily.    [provider]  meloxicam (MOBIC) 7.5 MG tablet Take 1 tablet (7.5 mg total) by mouth daily for 7 days. 11/18/19 11/25/19  Anastassia Noack S, PA-C  nabumetone (RELAFEN) 500 MG tablet Take 1 tablet (500 mg total) by mouth 2 (two) times daily as needed. 10/14/17   Mcarthur Rossetti, MD  predniSONE (DELTASONE) 50 MG tablet Take one tab daily. Patient not taking: Reported on 04/18/2018 12/13/16   Tereasa Coop, PA-C  sulfamethoxazole-trimethoprim (BACTRIM,SEPTRA) 400-80 MG tablet Take 1 tablet by mouth 2 (two) times daily.    [provider]  traMADol (ULTRAM) 50 MG tablet Take 1 tablet (50 mg total) by mouth every 8 (eight) hours as needed. Patient not taking: Reported on 04/18/2018 11/02/16   Scot Jun, FNP  traMADol (ULTRAM) 50 MG tablet Take 1 tablet (50 mg total) by mouth every 6 (six) hours as needed. Patient not taking: Reported on 04/18/2018 11/24/16   Lajean Saver, MD    Allergies    Patient has no known allergies.  Review of Systems   Review of Systems  Constitutional: Negative  for chills and fever.  Respiratory: Negative for shortness of breath.   Cardiovascular: Negative for chest pain.  Gastrointestinal: Positive for abdominal pain. Negative for nausea and vomiting.  Musculoskeletal: Positive for arthralgias.  Neurological: Negative for numbness.  All other systems reviewed and are negative.   Physical Exam Updated Vital Signs BP 137/76   Pulse 71   Temp 98.3 F (36.8 C) (Oral)   Resp 18   Ht 5\' 5"  (1.651 m)   Wt 108.9 kg   LMP 06/03/2016 (Approximate)   SpO2 100%   BMI 39.94 kg/m   Physical Exam Vitals and nursing note reviewed.  Constitutional:      Appearance: She is well-developed.  HENT:      Head: Normocephalic and atraumatic.  Eyes:     Conjunctiva/sclera: Conjunctivae normal.  Cardiovascular:     Rate and Rhythm: Normal rate and regular rhythm.     Heart sounds: Normal heart sounds. No murmur.  Pulmonary:     Effort: Pulmonary effort is normal. No respiratory distress.     Breath sounds: Normal breath sounds. No wheezing or rales.  Abdominal:     General: Bowel sounds are normal. There is no distension.     Palpations: Abdomen is soft.     Tenderness: There is no abdominal tenderness.  Musculoskeletal:        General: No deformity. Normal range of motion.     Right shoulder: Normal.     Left shoulder: Tenderness present. No swelling or deformity. Normal range of motion. Normal strength. Normal pulse.       Arms:     Cervical back: Normal and neck supple. No rigidity or bony tenderness. No pain with movement.  Skin:    General: Skin is warm and dry.     Findings: No erythema or rash.  Neurological:     Mental Status: She is alert and oriented to person, place, and time.  Psychiatric:        Behavior: Behavior normal.     ED Results / Procedures / Treatments   Labs (all labs ordered are listed, but only abnormal results are displayed) Labs Reviewed - No data to display  EKG EKG Interpretation  Date/Time:  Thursday November 18 2019 21:43:52 EST Ventricular Rate:  86 PR Interval:  160 QRS Duration: 78 QT Interval:  356 QTC Calculation: 426 R Axis:   48 Text Interpretation: Normal sinus rhythm Normal ECG When compared to prior, no signficant changes from prior. No sTEMI Confirmed by Antony Blackbird [phone]) on 11/18/2019 10:17:53 PM   Radiology DG Shoulder Left  Result Date: 11/18/2019 CLINICAL DATA:  Left shoulder stiffness or 1 week, pain for 2 days EXAM: LEFT SHOULDER - 2+ VIEW COMPARISON:  Radiograph Feb 03, 2004 FINDINGS: No acute osseous abnormality or suspicious osseous lesion. Subchondral cystic change at the greater tuberosity may reflect sequela of  rotator cuff tendinopathy. Soft tissues are unremarkable. Included portion of the left chest wall is free of acute abnormality. IMPRESSION: No acute osseous abnormality. Electronically Signed   By: Lovena Le M.D.   On: 11/18/2019 19:18    Procedures Procedures (including critical care time)  Medications Ordered in ED Medications  alum & mag hydroxide-simeth (MAALOX/MYLANTA) 200-200-20 MG/5ML suspension 30 mL (30 mLs Oral Given 11/18/19 2032)    ED Course  I have reviewed the triage vital signs and the nursing notes.  Pertinent labs & imaging results that were available during my care of the patient were reviewed by me and considered  in my medical decision making (see chart for details).    MDM Rules/Calculators/A&P                       Patient presented with complaints of left shoulder pain and upper abdominal pain.  She was given a GI cocktail for her abdominal pain and felt better within several minutes.  She denied any chest pain or shortness of breath on multiple evaluations.  Regarding her shoulder pain pain is easily reproducible and pinpoint in location.  Full range of motion of bilateral upper extremities, neurovascularly intact distally.  Imaging of the left shoulder is unremarkable.   Given left shoulder pain with no inciting injury or trauma discussed with patient possible causes of her pain including cardiac.  Given that patient has no chest pain or shortness of breath and that pain is easily reproducible, pinpoint, it is very unlikely cardiac.  Discussed blood work versus an EKG.  After discussion, patient feels comfortable with an EKG and foregoing blood work.  She is feeling much better, resting comfortably in bed, no acute distress, nontoxic, non-lethargic.  EKG shows no acute ST changes.  History and physical not consistent with ACS.  Physical exam more consistent with musculoskeletal pain.  Will discharge on NSAID and encouraged follow-up with Ortho.  Regarding her  epigastric pain, will treat with Pepcid.  Patient given return precautions, ready and stable for discharge.   At this time there does not appear to be any evidence of an acute emergency medical condition and the patient appears stable for discharge with appropriate outpatient follow up.Diagnosis was discussed with patient who verbalizes understanding and is agreeable to discharge. Pt case discussed with Dr.Tegeler who agrees with my plan.    Final Clinical Impression(s) / ED Diagnoses Final diagnoses:  Acute pain of left shoulder  Epigastric pain    Rx / DC Orders ED Discharge Orders         Ordered    meloxicam (MOBIC) 7.5 MG tablet  Daily     11/18/19 2208    famotidine (PEPCID) 20 MG tablet  Daily     11/18/19 2208           Etter Sjogren, PA-C 11/18/19 2221    Tegeler, Gwenyth Allegra, MD 11/19/19 0028

## 2019-12-13 ENCOUNTER — Ambulatory Visit: Payer: BC Managed Care – PPO | Attending: Internal Medicine

## 2019-12-13 DIAGNOSIS — Z20822 Contact with and (suspected) exposure to covid-19: Secondary | ICD-10-CM

## 2019-12-14 LAB — SARS-COV-2, NAA 2 DAY TAT

## 2019-12-14 LAB — NOVEL CORONAVIRUS, NAA: SARS-CoV-2, NAA: NOT DETECTED

## 2020-01-13 ENCOUNTER — Other Ambulatory Visit: Payer: Self-pay | Admitting: Internal Medicine

## 2020-01-13 DIAGNOSIS — E049 Nontoxic goiter, unspecified: Secondary | ICD-10-CM

## 2020-01-20 ENCOUNTER — Ambulatory Visit
Admission: RE | Admit: 2020-01-20 | Discharge: 2020-01-20 | Disposition: A | Discharge status: Home / Self Care | Payer: BC Managed Care – PPO | Source: Ambulatory Visit | Attending: Internal Medicine | Admitting: Internal Medicine

## 2020-01-20 DIAGNOSIS — E049 Nontoxic goiter, unspecified: Secondary | ICD-10-CM

## 2020-11-30 ENCOUNTER — Other Ambulatory Visit: Payer: Self-pay

## 2020-11-30 ENCOUNTER — Other Ambulatory Visit: Payer: Self-pay | Admitting: Internal Medicine

## 2020-11-30 ENCOUNTER — Ambulatory Visit
Admission: RE | Admit: 2020-11-30 | Discharge: 2020-11-30 | Disposition: A | Discharge status: Home / Self Care | Payer: BC Managed Care – PPO | Source: Ambulatory Visit | Attending: Internal Medicine | Admitting: Internal Medicine

## 2020-11-30 DIAGNOSIS — R109 Unspecified abdominal pain: Secondary | ICD-10-CM

## 2021-04-13 ENCOUNTER — Encounter (HOSPITAL_COMMUNITY): Payer: Self-pay | Admitting: Emergency Medicine

## 2021-04-13 ENCOUNTER — Other Ambulatory Visit: Payer: Self-pay

## 2021-04-13 ENCOUNTER — Ambulatory Visit (INDEPENDENT_AMBULATORY_CARE_PROVIDER_SITE_OTHER): Payer: BC Managed Care – PPO

## 2021-04-13 ENCOUNTER — Ambulatory Visit (HOSPITAL_COMMUNITY)
Admission: EM | Admit: 2021-04-13 | Discharge: 2021-04-13 | Disposition: A | Discharge status: Home / Self Care | Payer: BC Managed Care – PPO

## 2021-04-13 DIAGNOSIS — M25562 Pain in left knee: Secondary | ICD-10-CM | POA: Diagnosis not present

## 2021-04-13 MED ORDER — PREDNISONE 20 MG PO TABS: 40.0000 mg | ORAL_TABLET | Freq: Every day | ORAL | 0 refills | Status: DC

## 2021-04-13 NOTE — ED Triage Notes (Signed)
Was walking a few days ago and felt a pop in left knee after twisting it wrong. Reports recurrent swelling, pain worse with walking.

## 2021-04-14 NOTE — ED Provider Notes (Signed)
[facility]    CSN: [id number] Arrival date & time: 04/13/21  1919      History   Chief Complaint Chief Complaint  Patient presents with   Knee Injury    HPI Marie Tate is a 60 y.o. female.   Patient presenting today with left knee pain and swelling x3 days.  She states she felt a pop in the knee after she was walking and twisted wrong.  Able to bear weight but painful to do so.  Denies numbness, tingling, redness, weakness.  Trying over-the-counter pain relievers and rest with minimal relief.  No past injury to this knee.  Has known osteoarthritis, confirmed in the right knee.   Past Medical History:  Diagnosis Date   Acid reflux    Fatty liver    Hypertension     Patient Active Problem List   Diagnosis Date Noted   Chronic pain of right knee 04/02/2017   Unilateral primary osteoarthritis, right knee 04/02/2017   Bacterial vaginosis 07/09/2016   Epigastric pain 07/09/2016   Dysuria 07/09/2016   Hiatal hernia 07/09/2016   GERD (gastroesophageal reflux disease) 07/09/2016    Past Surgical History:  Procedure Laterality Date   UTERINE FIBROID SURGERY      OB History   No obstetric history on file.      Home Medications    Prior to Admission medications   Medication Sig Start Date End Date Taking? Authorizing Provider  lisinopril-hydrochlorothiazide (PRINZIDE,ZESTORETIC) 10-12.5 MG tablet Take 1 tablet by mouth daily. 02/27/16  Yes [provider]  omeprazole (PRILOSEC) 40 MG capsule Take 40 mg by mouth daily.   Yes [provider]  predniSONE (DELTASONE) 20 MG tablet Take 2 tablets (40 mg total) by mouth daily with breakfast. 04/13/21  Yes Volney American, PA-C  diclofenac (VOLTAREN) 75 MG EC tablet Take 1 tablet (75 mg total) by mouth 2 (two) times daily. 04/18/18   Vanessa Kick, MD  famotidine (PEPCID) 20 MG tablet Take 1 tablet (20 mg total) by mouth daily. 11/18/19   Kendrick, Caitlyn S, PA-C  Magnesium 250 MG TABS Take  250 mg by mouth daily.    [provider]  nabumetone (RELAFEN) 500 MG tablet Take 1 tablet (500 mg total) by mouth 2 (two) times daily as needed. 10/14/17   Mcarthur Rossetti, MD  predniSONE (DELTASONE) 50 MG tablet Take one tab daily. Patient not taking: Reported on 04/18/2018 12/13/16   Tereasa Coop, PA-C  sulfamethoxazole-trimethoprim (BACTRIM,SEPTRA) 400-80 MG tablet Take 1 tablet by mouth 2 (two) times daily.    [provider]  traMADol (ULTRAM) 50 MG tablet Take 1 tablet (50 mg total) by mouth every 8 (eight) hours as needed. Patient not taking: Reported on 04/18/2018 11/02/16   Scot Jun, FNP  traMADol (ULTRAM) 50 MG tablet Take 1 tablet (50 mg total) by mouth every 6 (six) hours as needed. Patient not taking: Reported on 04/18/2018 11/24/16   Lajean Saver, MD    Family History Family History  Problem Relation Age of Onset   Heart attack Mother    Stroke Father     Social History Social History   Tobacco Use   Smoking status: Former   Smokeless tobacco: Never  Substance Use Topics   Alcohol use: No    Comment: occ   Drug use: No     Allergies   Patient has no known allergies.   Review of Systems Review of Systems Per HPI  Physical Exam Triage Vital  Signs ED Triage Vitals  Enc Vitals Group     BP 04/13/21 1957 (!) 168/98     Pulse Rate 04/13/21 1957 87     Resp 04/13/21 1957 18     Temp 04/13/21 1957 98.7 F (37.1 C)     Temp Source 04/13/21 1957 Oral     SpO2 04/13/21 1957 96 %     Weight --      Height --      Head Circumference --      Peak Flow --      Pain Score 04/13/21 1958 8     Pain Loc --      Pain Edu? --      Excl. in [place]? --    No data found.  Updated Vital Signs BP (!) 168/98 (BP Location: Left Arm)   Pulse 87   Temp 98.7 F (37.1 C) (Oral)   Resp 18   LMP 06/03/2016 (Approximate)   SpO2 96%   Visual Acuity Right Eye Distance:   Left Eye Distance:   Bilateral Distance:    Right Eye Near:    Left Eye Near:    Bilateral Near:     Physical Exam Vitals and nursing note reviewed.  Constitutional:      Appearance: Normal appearance. She is not ill-appearing.  HENT:     Head: Atraumatic.  Eyes:     Extraocular Movements: Extraocular movements intact.     Conjunctiva/sclera: Conjunctivae normal.  Cardiovascular:     Rate and Rhythm: Normal rate and regular rhythm.     Heart sounds: Normal heart sounds.  Pulmonary:     Effort: Pulmonary effort is normal.     Breath sounds: Normal breath sounds.  Musculoskeletal:        General: Swelling, tenderness and signs of injury present. No deformity. Normal range of motion.     Cervical back: Normal range of motion and neck supple.     Comments: Bilateral anterior left knee swelling, tenderness to palpation.  Good range of motion active and passive, weightbearing with moderate pain.  Negative drawer testing and McMurray's testing  Skin:    General: Skin is warm and dry.     Findings: No bruising or erythema.  Neurological:     Mental Status: She is alert and oriented to person, place, and time.     Comments: Bilateral lower extremities neurovascular intact  Psychiatric:        Mood and Affect: Mood normal.        Thought Content: Thought content normal.        Judgment: Judgment normal.     UC Treatments / Results  Labs (all labs ordered are listed, but only abnormal results are displayed) Labs Reviewed - No data to display  EKG   Radiology DG Knee Complete 4 Views Left  Result Date: 04/13/2021 CLINICAL DATA:  Felt pop, twisting injury. EXAM: LEFT KNEE - COMPLETE 4+ VIEW COMPARISON:  None. FINDINGS: Small joint effusion. Early spurring in the patellofemoral compartment. Joint spaces maintained. No acute bony abnormality. Specifically, no fracture, subluxation, or dislocation. IMPRESSION: No acute bony abnormality.  Small joint effusion. Electronically Signed   By: Rolm Baptise M.D.   On: 04/13/2021 20:18     Procedures Procedures (including critical care time)  Medications Ordered in UC Medications - No data to display  Initial Impression / Assessment and Plan / UC Course  I have reviewed the triage vital signs and the nursing notes.  Pertinent labs &  imaging results that were available during my care of the patient were reviewed by me and considered in my medical decision making (see chart for details).     X-ray today revealing only a small joint effusion in the left knee, no bony abnormalities.  We will try prednisone, RICE protocol and follow-up with orthopedics if not fully resolving.  Final Clinical Impressions(s) / UC Diagnoses   Final diagnoses:  Acute pain of left knee   Discharge Instructions   None    ED Prescriptions     Medication Sig Dispense Auth. Provider   predniSONE (DELTASONE) 20 MG tablet Take 2 tablets (40 mg total) by mouth daily with breakfast. 10 tablet Volney American, [place]      PDMP not reviewed this encounter.   Volney American, [place] 04/14/21 [phone]

## 2021-04-17 ENCOUNTER — Encounter: Payer: Self-pay | Admitting: Gastroenterology

## 2021-05-23 DIAGNOSIS — H25813 Combined forms of age-related cataract, bilateral: Secondary | ICD-10-CM | POA: Diagnosis not present

## 2021-05-23 DIAGNOSIS — H43812 Vitreous degeneration, left eye: Secondary | ICD-10-CM | POA: Diagnosis not present

## 2021-05-28 ENCOUNTER — Ambulatory Visit: Payer: BC Managed Care – PPO | Admitting: Gastroenterology

## 2021-06-27 ENCOUNTER — Ambulatory Visit: Payer: BC Managed Care – PPO | Admitting: Gastroenterology

## 2021-07-20 ENCOUNTER — Other Ambulatory Visit: Payer: Self-pay

## 2021-07-20 ENCOUNTER — Emergency Department (HOSPITAL_COMMUNITY)
Admission: EM | Admit: 2021-07-20 | Discharge: 2021-07-20 | Disposition: A | Discharge status: Home / Self Care | Payer: BC Managed Care – PPO | Attending: Emergency Medicine | Admitting: Emergency Medicine

## 2021-07-20 ENCOUNTER — Encounter (HOSPITAL_COMMUNITY): Payer: Self-pay | Admitting: Emergency Medicine

## 2021-07-20 DIAGNOSIS — M25511 Pain in right shoulder: Secondary | ICD-10-CM | POA: Diagnosis present

## 2021-07-20 DIAGNOSIS — I10 Essential (primary) hypertension: Secondary | ICD-10-CM | POA: Diagnosis not present

## 2021-07-20 DIAGNOSIS — Z79899 Other long term (current) drug therapy: Secondary | ICD-10-CM | POA: Insufficient documentation

## 2021-07-20 DIAGNOSIS — Z87891 Personal history of nicotine dependence: Secondary | ICD-10-CM | POA: Diagnosis not present

## 2021-07-20 DIAGNOSIS — M549 Dorsalgia, unspecified: Secondary | ICD-10-CM

## 2021-07-20 DIAGNOSIS — M546 Pain in thoracic spine: Secondary | ICD-10-CM | POA: Diagnosis not present

## 2021-07-20 MED ORDER — LIDOCAINE 5 % EX PTCH: 1.0000 | MEDICATED_PATCH | CUTANEOUS | 0 refills | Status: DC

## 2021-07-20 MED ORDER — METHOCARBAMOL 500 MG PO TABS: 500.0000 mg | ORAL_TABLET | Freq: Every evening | ORAL | 0 refills | Status: DC | PRN

## 2021-07-20 NOTE — ED Notes (Signed)
Pt verbalized understanding of d/c instructions, follow up, medications and precautions. Pt ambulatory to [place] with steady gait, NAD

## 2021-07-20 NOTE — Discharge Instructions (Addendum)
Take ibuprofen 3 times a day with meals.  Do not take other anti-inflammatories at the same time (Advil, Motrin, naproxen, Aleve). You may supplement with Tylenol if you need further pain control. Use robaxin as needed for muscle stiffness or soreness.  Have caution, this may make you tired or groggy.  Do not drive or operate heavy machinery while taking this medicine. Use ice packs or heating pads if this helps control your pain. Use the lidocaine patches as needed for pain.  You may try muscle creams/patches such as salonpas, icyhot, bengay, biofreeze to help with pain.  You will likely have continued muscle soreness/spasm over the next couple days.  Follow-up with primary care or the orthopedic doctor in 1 week if your symptoms are not improving. Return to the emergency room if you develop numbness, difficulty breathing, fever, or any new, worsening, or concerning symptoms.

## 2021-07-20 NOTE — ED Provider Notes (Signed)
[facility] Provider Note   CSN: [id number] Arrival date & time: 07/20/21  1601     History No chief complaint on file.   Marie Tate is a 60 y.o. female presenting for evaluation of right shoulder pain.  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  She continued to iron and the symptoms worsened.  Since she stopped iron and, symptoms have improved but not completely resolved.  They continue to worsen when she moves.  No fall, trauma, or injury.  No numbness or tingling.  She has never had symptoms similar before.  She does not take anything including Tylenol ibuprofen.  Pain does not radiate anywhere.  No pain of the anterior chest.  No pain in the left side.  HPI     Past Medical History:  Diagnosis Date   Acid reflux    Fatty liver    Hypertension     Patient Active Problem List   Diagnosis Date Noted   Chronic pain of right knee 04/02/2017   Unilateral primary osteoarthritis, right knee 04/02/2017   Bacterial vaginosis 07/09/2016   Epigastric pain 07/09/2016   Dysuria 07/09/2016   Hiatal hernia 07/09/2016   GERD (gastroesophageal reflux disease) 07/09/2016    Past Surgical History:  Procedure Laterality Date   UTERINE FIBROID SURGERY       OB History   No obstetric history on file.     Family History  Problem Relation Age of Onset   Heart attack Mother    Stroke Father     Social History   Tobacco Use   Smoking status: Former   Smokeless tobacco: Never  Substance Use Topics   Alcohol use: No    Comment: occ   Drug use: No    Home Medications Prior to Admission medications   Medication Sig Start Date End Date Taking? Authorizing Provider  lidocaine (LIDODERM) 5 % Place 1 patch onto the skin daily. Remove & Discard patch within 12 hours or as directed by MD 07/20/21  Yes Jalaine Riggenbach, PA-C  methocarbamol (ROBAXIN) 500 MG tablet Take 1 tablet (500 mg  total) by mouth at bedtime as needed for muscle spasms. 07/20/21  Yes Katelyn Kohlmeyer, PA-C  diclofenac (VOLTAREN) 75 MG EC tablet Take 1 tablet (75 mg total) by mouth 2 (two) times daily. 04/18/18   Vanessa Kick, MD  famotidine (PEPCID) 20 MG tablet Take 1 tablet (20 mg total) by mouth daily. 11/18/19   Kendrick, Caitlyn S, PA-C  lisinopril-hydrochlorothiazide (PRINZIDE,ZESTORETIC) 10-12.5 MG tablet Take 1 tablet by mouth daily. 02/27/16   [provider]  Magnesium 250 MG TABS Take 250 mg by mouth daily.    [provider]  nabumetone (RELAFEN) 500 MG tablet Take 1 tablet (500 mg total) by mouth 2 (two) times daily as needed. 10/14/17   Mcarthur Rossetti, MD  omeprazole (PRILOSEC) 40 MG capsule Take 40 mg by mouth daily.    [provider]  predniSONE (DELTASONE) 20 MG tablet Take 2 tablets (40 mg total) by mouth daily with breakfast. 04/13/21   Volney American, PA-C  predniSONE (DELTASONE) 50 MG tablet Take one tab daily. Patient not taking: Reported on 04/18/2018 12/13/16   Tereasa Coop, PA-C  sulfamethoxazole-trimethoprim (BACTRIM,SEPTRA) 400-80 MG tablet Take 1 tablet by mouth 2 (two) times daily.    [provider]  traMADol (ULTRAM) 50 MG tablet Take 1 tablet (50 mg total) by mouth every  8 (eight) hours as needed. Patient not taking: Reported on 04/18/2018 11/02/16   Scot Jun, FNP  traMADol (ULTRAM) 50 MG tablet Take 1 tablet (50 mg total) by mouth every 6 (six) hours as needed. Patient not taking: Reported on 04/18/2018 11/24/16   Lajean Saver, MD    Allergies    Patient has no known allergies.  Review of Systems   Review of Systems  Musculoskeletal:  Positive for back pain.  All other systems reviewed and are negative.  Physical Exam Updated Vital Signs BP (!) 199/81 (BP Location: Right Arm)   Pulse (!) 109   Temp 98.5 F (36.9 C) (Oral)   Resp 14   LMP 06/03/2016 (Approximate)   SpO2 98%   Physical Exam Vitals and  nursing note reviewed.  Constitutional:      General: She is not in acute distress.    Appearance: She is well-developed.  HENT:     Head: Normocephalic and atraumatic.  Eyes:     Extraocular Movements: Extraocular movements intact.  Cardiovascular:     Rate and Rhythm: Normal rate.  Pulmonary:     Effort: Pulmonary effort is normal.  Abdominal:     General: There is no distension.  Musculoskeletal:        General: Normal range of motion.     Cervical back: Normal range of motion.       Back:     Comments: Tenderness palpation over the right upper back musculature.  No pain over midline spine.  No step-offs or deformities.  No tenderness palpation of the C-spine.  Pain does not extend into the lateral or anterior chest wall.  No rash.  Full active range of motion of the upper extremities.  Strength against resistance intact bilaterally.  Skin:    General: Skin is warm.     Findings: No rash.  Neurological:     Mental Status: She is alert and oriented to person, place, and time.    ED Results / Procedures / Treatments   Labs (all labs ordered are listed, but only abnormal results are displayed) Labs Reviewed - No data to display  EKG None  Radiology No results found.  Procedures Procedures   Medications Ordered in ED Medications - No data to display  ED Course  I have reviewed the triage vital signs and the nursing notes.  Pertinent labs & imaging results that were available during my care of the patient were reviewed by me and considered in my medical decision making (see chart for details).    MDM Rules/Calculators/A&P                           Patient presented for evaluation of back/shoulder pain that began while ironing.  On exam, patient peers nontoxic.  Pain is reproducible with palpation of the musculature.  Without fall, trauma, or injury, doubt fracture dislocation I do not believe an emergent x-ray would be beneficial.  Likely muscle pain.  Discussed  with patient.  Discussed symptomatic management with anti-inflammatories muscle relaxers and muscle creams.  Discussed follow-up with orthopedics.  At this time, patient appears safe for discharge.  Return precautions given.  Patient states she understands and agrees to plan.   Final Clinical Impression(s) / ED Diagnoses Final diagnoses:  Upper back pain on right side    Rx / DC Orders ED Discharge Orders          Ordered    methocarbamol (ROBAXIN)  500 MG tablet  At bedtime PRN        07/20/21 1819    lidocaine (LIDODERM) 5 %  Every 24 hours        07/20/21 1821             Dailin Sosnowski, PA-C 07/20/21 2012    Jeanell Sparrow, DO 07/20/21 2201

## 2021-07-20 NOTE — ED Triage Notes (Signed)
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 Has not taken anything for pain control

## 2021-07-24 ENCOUNTER — Ambulatory Visit: Payer: BC Managed Care – PPO | Admitting: Gastroenterology

## 2021-08-24 ENCOUNTER — Ambulatory Visit: Payer: BC Managed Care – PPO | Admitting: Gastroenterology

## 2021-08-24 ENCOUNTER — Telehealth: Payer: Self-pay | Admitting: Gastroenterology

## 2021-08-24 NOTE — Telephone Encounter (Signed)
Good Morning Dr. Candis Schatz,  Patient called and stated that her Grandchild was sick and she would not be able to make it in today for her 9:10 appointment.  She stated she will call back at a later time to reschedule.

## 2021-10-17 ENCOUNTER — Ambulatory Visit: Payer: BC Managed Care – PPO | Admitting: Gastroenterology

## 2021-11-08 ENCOUNTER — Encounter: Payer: Self-pay | Admitting: Gastroenterology

## 2021-11-08 ENCOUNTER — Ambulatory Visit (INDEPENDENT_AMBULATORY_CARE_PROVIDER_SITE_OTHER): Payer: Medicaid Other | Admitting: Gastroenterology

## 2021-11-08 ENCOUNTER — Other Ambulatory Visit (INDEPENDENT_AMBULATORY_CARE_PROVIDER_SITE_OTHER): Payer: BC Managed Care – PPO

## 2021-11-08 VITALS — BP 136/84 | HR 70 | Ht 65.0 in | Wt 217.4 lb

## 2021-11-08 DIAGNOSIS — R1031 Right lower quadrant pain: Secondary | ICD-10-CM | POA: Diagnosis not present

## 2021-11-08 DIAGNOSIS — Z1211 Encounter for screening for malignant neoplasm of colon: Secondary | ICD-10-CM

## 2021-11-08 DIAGNOSIS — K219 Gastro-esophageal reflux disease without esophagitis: Secondary | ICD-10-CM | POA: Diagnosis not present

## 2021-11-08 DIAGNOSIS — R1084 Generalized abdominal pain: Secondary | ICD-10-CM

## 2021-11-08 DIAGNOSIS — K760 Fatty (change of) liver, not elsewhere classified: Secondary | ICD-10-CM | POA: Diagnosis not present

## 2021-11-08 LAB — COMPREHENSIVE METABOLIC PANEL
ALT: 15 U/L (ref 0–35)
AST: 15 U/L (ref 0–37)
Albumin: 4.6 g/dL (ref 3.5–5.2)
Alkaline Phosphatase: 75 U/L (ref 39–117)
BUN: 18 mg/dL (ref 6–23)
CO2: 32 mEq/L (ref 19–32)
Calcium: 10 mg/dL (ref 8.4–10.5)
Chloride: 102 mEq/L (ref 96–112)
Creatinine, Ser: 0.79 mg/dL (ref 0.40–1.20)
GFR: 82.44 mL/min (ref >60.00)
Glucose, Bld: 103 mg/dL — ABNORMAL HIGH (ref 70–99)
Potassium: 4.4 mEq/L (ref 3.5–5.1)
Sodium: 138 mEq/L (ref 135–145)
Total Bilirubin: 0.4 mg/dL (ref 0.2–1.2)
Total Protein: 7.7 g/dL (ref 6.0–8.3)

## 2021-11-08 LAB — CBC WITH DIFFERENTIAL/PLATELET
Basophils Absolute: 0 10*3/uL (ref 0.0–0.1)
Basophils Relative: 0.6 % (ref 0.0–3.0)
Eosinophils Absolute: 0.2 10*3/uL (ref 0.0–0.7)
Eosinophils Relative: 2 % (ref 0.0–5.0)
HCT: 38.3 % (ref 36.0–46.0)
Hemoglobin: 12.5 g/dL (ref 12.0–15.0)
Lymphocytes Relative: 34.3 % (ref 12.0–46.0)
Lymphs Abs: 2.6 10*3/uL (ref 0.7–4.0)
MCHC: 32.7 g/dL (ref 30.0–36.0)
MCV: 87 fl (ref 78.0–100.0)
Monocytes Absolute: 0.8 10*3/uL (ref 0.1–1.0)
Monocytes Relative: 10 % (ref 3.0–12.0)
Neutro Abs: 4.1 10*3/uL (ref 1.4–7.7)
Neutrophils Relative %: 53.1 % (ref 43.0–77.0)
Platelets: 294 10*3/uL (ref 150.0–400.0)
RBC: 4.41 Mil/uL (ref 3.87–5.11)
RDW: 14.2 % (ref 11.5–15.5)
WBC: 7.6 10*3/uL (ref 4.0–10.5)

## 2021-11-08 NOTE — Progress Notes (Signed)
Ms. Marie Tate,  Your labs looked great.  Liver enzymes normal.  Blood counts normal.  Continue the diet and exercise changes you have made.  Recommend a repeat ultrasound in 3 years or so to see if the fatty changes have resolved.

## 2021-11-08 NOTE — Progress Notes (Signed)
HPI : Marie Tate is a very pleasant 61 year old female who is referred to [place] by [facility] for further evaluation of abdominal pain.  She states that she was having significant abdominal pain for several months from October through December.  Pain was typically located in the RLQ and would typically last 5-10 minutes at a time.  It was often associated with nausea, but no vomiting.  It was not reliably associated with eating or with bowel movements.  No change in bowel habits.  No blood in the stool.  She made some dietary changes about 2 months ago and this seems to have resolved her pain.  She started eating healthier, increasing consumption of fruits and vegetables and decreasing consumption of processed foods.  She has also started exercising regularly.  She has lost 12 lbs since instituting these changes.   Currently, she denies any problems with abdominal pain.  She continues to have regular bowel movements with soft, formed brown stools most days. She has a history of GERD and has been taking omeprazole since 2017.  She only takes the omeprazole as needed, which is infrequently (maybe 2-3x/month).  No dysphagia. She reports having a colonoscopy in 2016 with Dr. Benson Norway which she says was normal and was recommended to repeat in 10 years.  She has a history fatty liver disease, first noted in 2017.  Her liver enzymes have been normal (most recent for review are from 2017.  She thinks she has had liver enzymes more recently than that through her PCP which she thinks were normal.  A CT in Mar 2022 did not demonstrate significant steatosis (not commented on in report, and my review of the images showed a relatively normal liver density.  She rarely drinks alcohol.  Past Medical History:  Diagnosis Date   Acid reflux    Fatty liver    Hypertension      Past Surgical History:  Procedure Laterality Date   UTERINE FIBROID SURGERY     Family History  Problem Relation Age of Onset    Heart attack Mother    Stroke Father    Social History   Tobacco Use   Smoking status: Former   Smokeless tobacco: Never  Substance Use Topics   Alcohol use: No    Comment: occ   Drug use: No   Current Outpatient Medications  Medication Sig Dispense Refill   lisinopril-hydrochlorothiazide (PRINZIDE,ZESTORETIC) 10-12.5 MG tablet Take 1 tablet by mouth daily.  3   Magnesium 250 MG TABS Take 250 mg by mouth daily.     omeprazole (PRILOSEC) 40 MG capsule Take 40 mg by mouth daily.     diclofenac (VOLTAREN) 75 MG EC tablet Take 1 tablet (75 mg total) by mouth 2 (two) times daily. (Patient not taking: Reported on 11/08/2021) 14 tablet 0   famotidine (PEPCID) 20 MG tablet Take 1 tablet (20 mg total) by mouth daily. (Patient not taking: Reported on 11/08/2021) 14 tablet 0   lidocaine (LIDODERM) 5 % Place 1 patch onto the skin daily. Remove & Discard patch within 12 hours or as directed by MD (Patient not taking: Reported on 11/08/2021) 30 patch 0   methocarbamol (ROBAXIN) 500 MG tablet Take 1 tablet (500 mg total) by mouth at bedtime as needed for muscle spasms. (Patient not taking: Reported on 11/08/2021) 10 tablet 0   nabumetone (RELAFEN) 500 MG tablet Take 1 tablet (500 mg total) by mouth 2 (two) times daily as needed. (Patient not taking: Reported  on 11/08/2021) 180 tablet 1   predniSONE (DELTASONE) 20 MG tablet Take 2 tablets (40 mg total) by mouth daily with breakfast. (Patient not taking: Reported on 11/08/2021) 10 tablet 0   predniSONE (DELTASONE) 50 MG tablet Take one tab daily. (Patient not taking: Reported on 04/18/2018) 3 tablet 0   sulfamethoxazole-trimethoprim (BACTRIM,SEPTRA) 400-80 MG tablet Take 1 tablet by mouth 2 (two) times daily. (Patient not taking: Reported on 11/08/2021)     traMADol (ULTRAM) 50 MG tablet Take 1 tablet (50 mg total) by mouth every 8 (eight) hours as needed. (Patient not taking: Reported on 04/18/2018) 20 tablet 0   traMADol (ULTRAM) 50 MG tablet Take 1 tablet (50  mg total) by mouth every 6 (six) hours as needed. (Patient not taking: Reported on 04/18/2018) 15 tablet 0   No current facility-administered medications for this visit.   No Known Allergies   Review of Systems: All systems reviewed and negative except where noted in HPI.    No results found.  Physical Exam: BP 136/84 (BP Location: Left Arm, Patient Position: Sitting, Cuff Size: Normal)    Pulse 70    Ht 5\' 5"  (1.651 m)    Wt 217 lb 6.4 oz (98.6 kg)    LMP 06/03/2016 (Approximate)    SpO2 98%    BMI 36.18 kg/m  Constitutional: Pleasant,well-developed, African American female in no acute distress. HEENT: Normocephalic and atraumatic. Conjunctivae are normal. No scleral icterus. Cardiovascular: Normal rate, regular rhythm.  Pulmonary/chest: Effort normal and breath sounds normal. No wheezing, rales or rhonchi. Abdominal: Soft, nondistended, nontender. Bowel sounds active throughout. There are no masses palpable. No hepatomegaly. Extremities: no edema Neurological: Alert and oriented to person place and time. Skin: Skin is warm and dry. No rashes noted. Psychiatric: Normal mood and affect. Behavior is normal.  CBC    Component Value Date/Time   WBC 7.8 04/03/2016 1514   RBC 4.42 04/03/2016 1514   HGB 12.5 04/03/2016 1514   HCT 37.0 04/03/2016 1514   PLT 390 04/03/2016 1514   MCV 83.7 04/03/2016 1514   MCH 28.3 04/03/2016 1514   MCHC 33.8 04/03/2016 1514   RDW 14.1 04/03/2016 1514   LYMPHSABS 3.8 03/31/2016 0518   MONOABS 1.1 (H) 03/31/2016 0518   EOSABS 0.2 03/31/2016 0518   BASOSABS 0.0 03/31/2016 0518    CMP     Component Value Date/Time   NA 139 06/04/2016 1114   K 4.4 06/04/2016 1114   CL 103 06/04/2016 1114   CO2 23 06/04/2016 1114   GLUCOSE 101 (H) 06/04/2016 1114   BUN 11 06/04/2016 1114   CREATININE 0.77 06/04/2016 1114   CALCIUM 10.2 06/04/2016 1114   PROT 7.6 06/04/2016 1114   ALBUMIN 4.5 06/04/2016 1114   AST 13 06/04/2016 1114   ALT 10 06/04/2016 1114    ALKPHOS 88 06/04/2016 1114   BILITOT 0.5 06/04/2016 1114   GFRNONAA >60 03/31/2016 0518   GFRAA >60 03/31/2016 0518     ASSESSMENT AND PLAN: 61 year old female with 2 months of bothersome episodic abdominal pain which has since resolved, possibly related to dietary changes.  Currently not having any bothersome pain.  No red flag symptoms.  Given resolution of pain, I would not recommend any further evaluation or treatment at this time.   She has infrequent GERD symptoms and takes omeprazole only on an as needed basis.  No indications for upper endoscopy. She has a history of fatty liver disease, but her most recent CT in Mar 2022  did not demonstrate significant steatosis.  She has already instituted lifestyle changes that are recommended to treat fatty liver disease by improving her diet and exercising.  Will get a CBC and CMP to calculate Fib-4 score and send for elastography if score is elevated.  Recommended repeating an ultrasound in 2-3 years to reassess for steatosis.  Abd pain, resolved - No further evaluation or treatment recommended at this time  Fatty liver - CBC, CMP - Elastography if elevated Fib-4 - Continue lifestyle changes already instituted  GERD - Continue PRN omeprazole - No indication for EGD - Expect symptoms will continue to improve with her lifestyle changes  CRC screening - Due 2026 - Request procedure from Dr. Ulyses Amor office to confirm patient's recollection  Hareem Surowiec E. Candis Schatz, MD [facility]    [place], [place]*

## 2021-11-08 NOTE — Patient Instructions (Signed)
If you are age 59 or older, your body mass index should be between 23-30. Your Body mass index is 36.18 kg/m. If this is out of the aforementioned range listed, please consider follow up with your Primary Care Provider.  If you are age 74 or younger, your body mass index should be between 19-25. Your Body mass index is 36.18 kg/m. If this is out of the aformentioned range listed, please consider follow up with your Primary Care Provider.   Your provider has requested that you go to the basement level for lab work before leaving today. Press "B" on the elevator. The lab is located at the first door on the left as you exit the elevator.  The [facility] providers would like to encourage you to use [facility] to communicate with providers for non-urgent requests or questions.  Due to long hold times on the telephone, sending your provider a message by [facility] may be a faster and more efficient way to get a response.  Please allow 48 business hours for a response.  Please remember that this is for non-urgent requests.   Due to recent changes in healthcare laws, you may see the results of your imaging and laboratory studies on MyChart before your provider has had a chance to review them.  We understand that in some cases there may be results that are confusing or concerning to you. Not all laboratory results come back in the same time frame and the provider may be waiting for multiple results in order to interpret others.  Please give [place] 48 hours in order for your provider to thoroughly review all the results before contacting the office for clarification of your results.   It was a pleasure to see you today!  Thank you for trusting me with your gastrointestinal care!    Scott E.Candis Schatz, MD

## 2021-11-10 ENCOUNTER — Encounter: Payer: Self-pay | Admitting: Gastroenterology

## 2021-12-19 DIAGNOSIS — H25813 Combined forms of age-related cataract, bilateral: Secondary | ICD-10-CM | POA: Diagnosis not present

## 2021-12-19 DIAGNOSIS — H43813 Vitreous degeneration, bilateral: Secondary | ICD-10-CM | POA: Diagnosis not present

## 2021-12-21 ENCOUNTER — Encounter (HOSPITAL_COMMUNITY): Payer: Self-pay | Admitting: Emergency Medicine

## 2021-12-21 ENCOUNTER — Emergency Department (HOSPITAL_COMMUNITY)
Admission: EM | Admit: 2021-12-21 | Discharge: 2021-12-21 | Disposition: A | Discharge status: Home / Self Care | Payer: 59 | Attending: Emergency Medicine | Admitting: Emergency Medicine

## 2021-12-21 DIAGNOSIS — M791 Myalgia, unspecified site: Secondary | ICD-10-CM | POA: Insufficient documentation

## 2021-12-21 DIAGNOSIS — Z79899 Other long term (current) drug therapy: Secondary | ICD-10-CM | POA: Insufficient documentation

## 2021-12-21 DIAGNOSIS — R748 Abnormal levels of other serum enzymes: Secondary | ICD-10-CM

## 2021-12-21 DIAGNOSIS — E876 Hypokalemia: Secondary | ICD-10-CM | POA: Insufficient documentation

## 2021-12-21 LAB — CBC WITH DIFFERENTIAL/PLATELET
Abs Immature Granulocytes: 0.03 10*3/uL (ref 0.00–0.07)
Basophils Absolute: 0 10*3/uL (ref 0.0–0.1)
Basophils Relative: 0 %
Eosinophils Absolute: 0.2 10*3/uL (ref 0.0–0.5)
Eosinophils Relative: 2 %
HCT: 39.7 % (ref 36.0–46.0)
Hemoglobin: 13.4 g/dL (ref 12.0–15.0)
Immature Granulocytes: 0 %
Lymphocytes Relative: 32 %
Lymphs Abs: 3.1 10*3/uL (ref 0.7–4.0)
MCH: 30 pg (ref 26.0–34.0)
MCHC: 33.8 g/dL (ref 30.0–36.0)
MCV: 88.8 fL (ref 80.0–100.0)
Monocytes Absolute: 0.9 10*3/uL (ref 0.1–1.0)
Monocytes Relative: 9 %
Neutro Abs: 5.7 10*3/uL (ref 1.7–7.7)
Neutrophils Relative %: 57 %
Platelets: 299 10*3/uL (ref 150–400)
RBC: 4.47 MIL/uL (ref 3.87–5.11)
RDW: 14 % (ref 11.5–15.5)
WBC: 10 10*3/uL (ref 4.0–10.5)
nRBC: 0 % (ref 0.0–0.2)

## 2021-12-21 LAB — COMPREHENSIVE METABOLIC PANEL
ALT: 21 U/L (ref 0–44)
AST: 20 U/L (ref 15–41)
Albumin: 4.4 g/dL (ref 3.5–5.0)
Alkaline Phosphatase: 84 U/L (ref 38–126)
Anion gap: 9 (ref 5–15)
BUN: 19 mg/dL (ref 6–20)
CO2: 25 mmol/L (ref 22–32)
Calcium: 9.4 mg/dL (ref 8.9–10.3)
Chloride: 103 mmol/L (ref 98–111)
Creatinine, Ser: 0.85 mg/dL (ref 0.44–1.00)
GFR, Estimated: >60 mL/min (ref >60)
Glucose, Bld: 93 mg/dL (ref 70–99)
Potassium: 3.3 mmol/L — ABNORMAL LOW (ref 3.5–5.1)
Sodium: 137 mmol/L (ref 135–145)
Total Bilirubin: 0.6 mg/dL (ref 0.3–1.2)
Total Protein: 8 g/dL (ref 6.5–8.1)

## 2021-12-21 LAB — CK: Total CK: 300 U/L — ABNORMAL HIGH (ref 38–234)

## 2021-12-21 MED ORDER — POTASSIUM CHLORIDE CRYS ER 20 MEQ PO TBCR
40.0000 meq | EXTENDED_RELEASE_TABLET | Freq: Once | ORAL | Status: AC
  Administered 2021-12-21: 40 meq via ORAL
  Filled 2021-12-21: qty 2

## 2021-12-21 MED ORDER — ACETAMINOPHEN 325 MG PO TABS
650.0000 mg | ORAL_TABLET | Freq: Once | ORAL | Status: AC
  Administered 2021-12-21: 650 mg via ORAL
  Filled 2021-12-21: qty 2

## 2021-12-21 NOTE — ED Triage Notes (Signed)
Patient c/o cramp to L thigh x2 weeks. States since that time cramps progressed to R leg and bilateral arms. ?

## 2021-12-21 NOTE — Discharge Instructions (Signed)
You are seen in the ER for muscle aches. ?The muscle enzyme is slightly elevated, but it is unclear why that is the case. ? ?We recommend that you follow-up with your primary care doctor in 7 to 14 days for recheck of your CK level.  Also ask if any of your medications could be causing this. ? ?We recommend that you take Tylenol 500 mg every 8 hours.  Ibuprofen can be taken in addition only if needed. ? ?Make sure you hydrate well. ?

## 2021-12-21 NOTE — ED Provider Notes (Signed)
?[facility] DEPT ?Provider Note ? ? ?CSN: [id number] ?Arrival date & time: 12/21/21  1240 ? ?  ? ?History ? ?Chief Complaint  ?Patient presents with  ? Generalized Body Aches  ? ? ?Marie Tate is a 61 y.o. female. ? ?HPI ? ?  ? ?61 year old female comes in with chief complaint of body aches.  Patient indicates that she was having cramping pain over the last 2 weeks over her left thigh, and the symptoms have jumped to her left upper extremity and right thigh as well.  She denies any new medications over the last 2 weeks.  She did receive Ozempic, but it was given after the pain had begun.  She also has been hydrating well. ? ?Review of systems negative for alcohol use disorder, substance use disorder, heavy tobacco use. ? ?There is no family history of muscle disorder.  No trauma, no increased work out. ? ?Home Medications ?Prior to Admission medications   ?Medication Sig Start Date End Date Taking? Authorizing Provider  ?diclofenac (VOLTAREN) 75 MG EC tablet Take 1 tablet (75 mg total) by mouth 2 (two) times daily. ?Patient not taking: Reported on 11/08/2021 04/18/18   Vanessa Kick, MD  ?famotidine (PEPCID) 20 MG tablet Take 1 tablet (20 mg total) by mouth daily. ?Patient not taking: Reported on 11/08/2021 11/18/19   Etter Sjogren, PA-C  ?lidocaine (LIDODERM) 5 % Place 1 patch onto the skin daily. Remove & Discard patch within 12 hours or as directed by MD ?Patient not taking: Reported on 11/08/2021 07/20/21   Caccavale, Sophia, PA-C  ?lisinopril-hydrochlorothiazide (PRINZIDE,ZESTORETIC) 10-12.5 MG tablet Take 1 tablet by mouth daily. 02/27/16   [provider]  ?Magnesium 250 MG TABS Take 250 mg by mouth daily.    [provider]  ?methocarbamol (ROBAXIN) 500 MG tablet Take 1 tablet (500 mg total) by mouth at bedtime as needed for muscle spasms. ?Patient not taking: Reported on 11/08/2021 07/20/21   Caccavale, Sophia, PA-C  ?nabumetone (RELAFEN) 500 MG tablet Take 1  tablet (500 mg total) by mouth 2 (two) times daily as needed. ?Patient not taking: Reported on 11/08/2021 10/14/17   Mcarthur Rossetti, MD  ?omeprazole (PRILOSEC) 40 MG capsule Take 40 mg by mouth daily.    [provider]  ?predniSONE (DELTASONE) 20 MG tablet Take 2 tablets (40 mg total) by mouth daily with breakfast. ?Patient not taking: Reported on 11/08/2021 04/13/21   Volney American, PA-C  ?predniSONE (DELTASONE) 50 MG tablet Take one tab daily. ?Patient not taking: Reported on 04/18/2018 12/13/16   Tereasa Coop, PA-C  ?sulfamethoxazole-trimethoprim (BACTRIM,SEPTRA) 400-80 MG tablet Take 1 tablet by mouth 2 (two) times daily. ?Patient not taking: Reported on 11/08/2021    [provider]  ?traMADol (ULTRAM) 50 MG tablet Take 1 tablet (50 mg total) by mouth every 8 (eight) hours as needed. ?Patient not taking: Reported on 04/18/2018 11/02/16   Scot Jun, FNP  ?traMADol (ULTRAM) 50 MG tablet Take 1 tablet (50 mg total) by mouth every 6 (six) hours as needed. ?Patient not taking: Reported on 04/18/2018 11/24/16   Lajean Saver, MD  ?VITAMIN D, CHOLECALCIFEROL, PO Take by mouth.    [provider]  ?vitamin E 1000 UNIT capsule Take 1,000 Units by mouth daily.    [provider]  ?   ? ?Allergies    ?Patient has no known allergies.   ? ?Review of Systems   ?Review of Systems  ?All other systems reviewed and are negative. ? ?  Physical Exam ?Updated Vital Signs ?BP 128/70   Pulse 75   Temp 98.2 ?F (36.8 ?C) (Oral)   Resp 18   LMP 06/03/2016 (Approximate)   SpO2 100%  ?Physical Exam ?Vitals and nursing note reviewed.  ?Constitutional:   ?   Appearance: She is well-developed.  ?HENT:  ?   Head: Atraumatic.  ?Eyes:  ?   Extraocular Movements: Extraocular movements intact.  ?   Pupils: Pupils are equal, round, and reactive to light.  ?Cardiovascular:  ?   Rate and Rhythm: Normal rate.  ?Pulmonary:  ?   Effort: Pulmonary effort is normal.  ?Musculoskeletal:     ?    General: No swelling.  ?   Cervical back: Normal range of motion and neck supple.  ?   Right lower leg: No edema.  ?   Left lower leg: No edema.  ?Skin: ?   General: Skin is warm and dry.  ?Neurological:  ?   Mental Status: She is alert and oriented to person, place, and time.  ? ? ?ED Results / Procedures / Treatments   ?Labs ?(all labs ordered are listed, but only abnormal results are displayed) ?Labs Reviewed  ?COMPREHENSIVE METABOLIC PANEL - Abnormal; Notable for the following components:  ?    Result Value  ? Potassium 3.3 (*)   ? All other components within normal limits  ?CK - Abnormal; Notable for the following components:  ? Total CK 300 (*)   ? All other components within normal limits  ?CBC WITH DIFFERENTIAL/PLATELET  ? ? ?EKG ?None ? ?Radiology ?No results found. ? ?Procedures ?Procedures  ? ? ?Medications Ordered in ED ?Medications  ?potassium chloride SA (KLOR-CON M) CR tablet 40 mEq (has no administration in time range)  ?acetaminophen (TYLENOL) tablet 650 mg (has no administration in time range)  ? ? ?ED Course/ Medical Decision Making/ A&P ?  ?                        ?Medical Decision Making ?61 year old female comes in with chief complaint of cramps. ? ?Patient is having cramps over her bilateral leg, left worse than right over the last few days.  She is also reporting cramps over her upper extremity. ? ?No nausea, vomiting, substance use disorder.  She has no history of DVT, PE and no risk factors for the same.  No signs of DVT on exam. ? ?Differential diagnosis included hypokalemia, myositis, medication side effect. ? ?CBC, metabolic profile and CK ordered. ? ?Results reviewed, patient is noted to have mild hypokalemia, that is likely incidental.  Replacement potassium ordered. ?She does have a CK of 300, but it is unclear what is the cause.  Patient denies any new medications and she is not on any statin.  ? ?Results discussed with her.  We will advise her to follow-up with her PCP in a week for  repeat CK level.  As needed medications for now. ? ?Amount and/or Complexity of Data Reviewed ?Labs: ordered. ? ?Risk ?OTC drugs. ?Prescription drug management. ? ? ? ?Final Clinical Impression(s) / ED Diagnoses ?Final diagnoses:  ?Elevated CK  ?Myalgia  ?Hypokalemia  ? ? ?Rx / DC Orders ?ED Discharge Orders   ? ? None  ? ?  ? ? ?  ?Varney Biles, MD ?12/21/21 1534 ? ?

## 2021-12-25 DIAGNOSIS — H25811 Combined forms of age-related cataract, right eye: Secondary | ICD-10-CM | POA: Diagnosis not present

## 2021-12-25 DIAGNOSIS — H25812 Combined forms of age-related cataract, left eye: Secondary | ICD-10-CM | POA: Diagnosis not present

## 2021-12-25 DIAGNOSIS — Z01818 Encounter for other preprocedural examination: Secondary | ICD-10-CM | POA: Diagnosis not present

## 2021-12-27 ENCOUNTER — Other Ambulatory Visit (HOSPITAL_COMMUNITY): Payer: Self-pay | Admitting: Internal Medicine

## 2021-12-27 DIAGNOSIS — M79662 Pain in left lower leg: Secondary | ICD-10-CM

## 2021-12-28 ENCOUNTER — Ambulatory Visit (HOSPITAL_COMMUNITY)
Admission: RE | Admit: 2021-12-28 | Discharge: 2021-12-28 | Disposition: A | Discharge status: Home / Self Care | Payer: Medicaid Other | Source: Ambulatory Visit | Attending: Internal Medicine | Admitting: Internal Medicine

## 2021-12-28 DIAGNOSIS — M79662 Pain in left lower leg: Secondary | ICD-10-CM | POA: Diagnosis not present

## 2021-12-28 DIAGNOSIS — M7989 Other specified soft tissue disorders: Secondary | ICD-10-CM | POA: Insufficient documentation

## 2022-01-02 DIAGNOSIS — H25811 Combined forms of age-related cataract, right eye: Secondary | ICD-10-CM | POA: Diagnosis not present

## 2022-03-18 LAB — HM MAMMOGRAPHY

## 2022-04-03 DIAGNOSIS — H43813 Vitreous degeneration, bilateral: Secondary | ICD-10-CM | POA: Diagnosis not present

## 2022-06-30 ENCOUNTER — Other Ambulatory Visit: Payer: Self-pay

## 2022-06-30 ENCOUNTER — Emergency Department (HOSPITAL_COMMUNITY)
Admission: EM | Admit: 2022-06-30 | Discharge: 2022-06-30 | Discharge status: Against Medical Advice | Payer: Medicaid Other | Attending: Emergency Medicine | Admitting: Emergency Medicine

## 2022-06-30 ENCOUNTER — Emergency Department (HOSPITAL_COMMUNITY): Payer: Medicaid Other

## 2022-06-30 ENCOUNTER — Encounter (HOSPITAL_COMMUNITY): Payer: Self-pay | Admitting: Emergency Medicine

## 2022-06-30 DIAGNOSIS — R0602 Shortness of breath: Secondary | ICD-10-CM | POA: Diagnosis not present

## 2022-06-30 DIAGNOSIS — Z5321 Procedure and treatment not carried out due to patient leaving prior to being seen by health care provider: Secondary | ICD-10-CM | POA: Insufficient documentation

## 2022-06-30 DIAGNOSIS — K219 Gastro-esophageal reflux disease without esophagitis: Secondary | ICD-10-CM | POA: Diagnosis not present

## 2022-06-30 DIAGNOSIS — R0789 Other chest pain: Secondary | ICD-10-CM | POA: Diagnosis not present

## 2022-06-30 LAB — CBC WITH DIFFERENTIAL/PLATELET
Abs Immature Granulocytes: 0.03 10*3/uL (ref 0.00–0.07)
Basophils Absolute: 0 10*3/uL (ref 0.0–0.1)
Basophils Relative: 0 %
Eosinophils Absolute: 0.2 10*3/uL (ref 0.0–0.5)
Eosinophils Relative: 3 %
HCT: 40.3 % (ref 36.0–46.0)
Hemoglobin: 12.7 g/dL (ref 12.0–15.0)
Immature Granulocytes: 0 %
Lymphocytes Relative: 41 %
Lymphs Abs: 3.6 10*3/uL (ref 0.7–4.0)
MCH: 28.6 pg (ref 26.0–34.0)
MCHC: 31.5 g/dL (ref 30.0–36.0)
MCV: 90.8 fL (ref 80.0–100.0)
Monocytes Absolute: 0.6 10*3/uL (ref 0.1–1.0)
Monocytes Relative: 7 %
Neutro Abs: 4.3 10*3/uL (ref 1.7–7.7)
Neutrophils Relative %: 49 %
Platelets: 332 10*3/uL (ref 150–400)
RBC: 4.44 MIL/uL (ref 3.87–5.11)
RDW: 13.2 % (ref 11.5–15.5)
WBC: 8.9 10*3/uL (ref 4.0–10.5)
nRBC: 0 % (ref 0.0–0.2)

## 2022-06-30 LAB — BASIC METABOLIC PANEL
Anion gap: 7 (ref 5–15)
BUN: 14 mg/dL (ref 6–20)
CO2: 28 mmol/L (ref 22–32)
Calcium: 9.5 mg/dL (ref 8.9–10.3)
Chloride: 105 mmol/L (ref 98–111)
Creatinine, Ser: 0.83 mg/dL (ref 0.44–1.00)
GFR, Estimated: >60 mL/min (ref >60)
Glucose, Bld: 119 mg/dL — ABNORMAL HIGH (ref 70–99)
Potassium: 4.2 mmol/L (ref 3.5–5.1)
Sodium: 140 mmol/L (ref 135–145)

## 2022-06-30 LAB — TROPONIN I (HIGH SENSITIVITY): Troponin I (High Sensitivity): 2 ng/L (ref <18)

## 2022-06-30 MED ORDER — ALBUTEROL SULFATE (2.5 MG/3ML) 0.083% IN NEBU: 3.0000 mL | INHALATION_SOLUTION | RESPIRATORY_TRACT | Status: DC | PRN

## 2022-06-30 NOTE — ED Triage Notes (Signed)
Pt via POV from home c/o SOB x 2 days intermittently. This afternoon it started again and pt feels like she cannot draw a deep breath. She has also had what feels like acid reflux. Symptoms are worse when she drinks something hot. No CP, n/v/dizziness. Pt states she has been a little bit sweaty during these episodes. NAD in triage, hypertension noted.

## 2022-06-30 NOTE — ED Provider Triage Note (Signed)
Emergency Medicine Provider Triage Evaluation Note  Marie Tate , a 61 y.o. female  was evaluated in triage.  Pt complains of shortness of breath for 3 days.  Says that it is worse with any movement but present at baseline.  Review of Systems  Positive: Reflux, shortness of breath Negative:   Physical Exam  BP (!) 142/101 (BP Location: Left Arm)   Pulse 91   Temp 98.1 F (36.7 C) (Oral)   Resp 18   Ht '5\' 5"'$  (1.651 m)   Wt 91.2 kg   LMP 06/03/2016 (Approximate)   SpO2 99%   BMI 33.45 kg/m  Gen:   Awake, no distress   Resp:  Normal effort  MSK:   Moves extremities without difficulty  Other:  Lung sounds clear, RRR, speaking in complete sentences  Medical Decision Making  Medically screening exam initiated at 4:59 PM.  Appropriate orders placed.  Kristyl Athens Stanton was informed that the remainder of the evaluation will be completed by another provider, this initial triage assessment does not replace that evaluation, and the importance of remaining in the ED until their evaluation is complete.   Basic labs and chest x-ray.  Will add troponin due to slight chest discomfort   Kyllie Pettijohn A, PA-C 06/30/22 1700

## 2022-08-14 DIAGNOSIS — M1712 Unilateral primary osteoarthritis, left knee: Secondary | ICD-10-CM | POA: Diagnosis not present

## 2022-09-06 ENCOUNTER — Encounter (HOSPITAL_COMMUNITY): Payer: Self-pay

## 2022-09-06 ENCOUNTER — Emergency Department (HOSPITAL_COMMUNITY): Payer: Medicaid Other

## 2022-09-06 ENCOUNTER — Emergency Department (HOSPITAL_COMMUNITY)
Admission: EM | Admit: 2022-09-06 | Discharge: 2022-09-06 | Disposition: A | Discharge status: Home / Self Care | Payer: Medicaid Other | Attending: Emergency Medicine | Admitting: Emergency Medicine

## 2022-09-06 DIAGNOSIS — Z1152 Encounter for screening for COVID-19: Secondary | ICD-10-CM | POA: Diagnosis not present

## 2022-09-06 DIAGNOSIS — I10 Essential (primary) hypertension: Secondary | ICD-10-CM | POA: Diagnosis not present

## 2022-09-06 DIAGNOSIS — Z79899 Other long term (current) drug therapy: Secondary | ICD-10-CM | POA: Diagnosis not present

## 2022-09-06 DIAGNOSIS — M79602 Pain in left arm: Secondary | ICD-10-CM | POA: Diagnosis not present

## 2022-09-06 DIAGNOSIS — R079 Chest pain, unspecified: Secondary | ICD-10-CM | POA: Diagnosis not present

## 2022-09-06 LAB — RESP PANEL BY RT-PCR (RSV, FLU A&B, COVID)  RVPGX2
Influenza A by PCR: NEGATIVE
Influenza B by PCR: NEGATIVE
Resp Syncytial Virus by PCR: NEGATIVE
SARS Coronavirus 2 by RT PCR: NEGATIVE

## 2022-09-06 LAB — CBC WITH DIFFERENTIAL/PLATELET
Abs Immature Granulocytes: 0.02 10*3/uL (ref 0.00–0.07)
Basophils Absolute: 0 10*3/uL (ref 0.0–0.1)
Basophils Relative: 0 %
Eosinophils Absolute: 0.1 10*3/uL (ref 0.0–0.5)
Eosinophils Relative: 1 %
HCT: 40 % (ref 36.0–46.0)
Hemoglobin: 12.4 g/dL (ref 12.0–15.0)
Immature Granulocytes: 0 %
Lymphocytes Relative: 35 %
Lymphs Abs: 2.8 10*3/uL (ref 0.7–4.0)
MCH: 28.6 pg (ref 26.0–34.0)
MCHC: 31 g/dL (ref 30.0–36.0)
MCV: 92.4 fL (ref 80.0–100.0)
Monocytes Absolute: 0.7 10*3/uL (ref 0.1–1.0)
Monocytes Relative: 9 %
Neutro Abs: 4.3 10*3/uL (ref 1.7–7.7)
Neutrophils Relative %: 55 %
Platelets: 275 10*3/uL (ref 150–400)
RBC: 4.33 MIL/uL (ref 3.87–5.11)
RDW: 14.3 % (ref 11.5–15.5)
WBC: 7.9 10*3/uL (ref 4.0–10.5)
nRBC: 0 % (ref 0.0–0.2)

## 2022-09-06 LAB — TROPONIN I (HIGH SENSITIVITY): Troponin I (High Sensitivity): <2 ng/L (ref <18)

## 2022-09-06 LAB — COMPREHENSIVE METABOLIC PANEL
ALT: 16 U/L (ref 0–44)
AST: 16 U/L (ref 15–41)
Albumin: 3.9 g/dL (ref 3.5–5.0)
Alkaline Phosphatase: 77 U/L (ref 38–126)
Anion gap: 7 (ref 5–15)
BUN: 12 mg/dL (ref 6–20)
CO2: 27 mmol/L (ref 22–32)
Calcium: 9.2 mg/dL (ref 8.9–10.3)
Chloride: 107 mmol/L (ref 98–111)
Creatinine, Ser: 0.73 mg/dL (ref 0.44–1.00)
GFR, Estimated: >60 mL/min (ref >60)
Glucose, Bld: 100 mg/dL — ABNORMAL HIGH (ref 70–99)
Potassium: 3.6 mmol/L (ref 3.5–5.1)
Sodium: 141 mmol/L (ref 135–145)
Total Bilirubin: 0.6 mg/dL (ref 0.3–1.2)
Total Protein: 7 g/dL (ref 6.5–8.1)

## 2022-09-06 MED ORDER — KETOROLAC TROMETHAMINE 15 MG/ML IJ SOLN
15.0000 mg | Freq: Once | INTRAMUSCULAR | Status: AC
  Administered 2022-09-06: 15 mg via INTRAMUSCULAR
  Filled 2022-09-06: qty 1

## 2022-09-06 NOTE — ED Triage Notes (Signed)
Pt arrived via POV, c/o left arm pain. States she has been having intermittent muscle cramping and body aches in entire body but pain worsening in the left arm. Denies any trauma to area.

## 2022-09-06 NOTE — Discharge Instructions (Signed)
Note the workup today was overall reassuring.  Symptoms most likely secondary to muscular spasm as we discussed.  Wear wrist brace as needed to help aid with symptoms.  You can take nonsteroidals in the form of ibuprofen/Aleve, rest, ice affected arm.  Recommend reevaluation by your primary care in 7 to 10 days.  Please do not hesitate to return to emergency department for worrisome signs and symptoms we discussed become apparent.

## 2022-09-06 NOTE — Progress Notes (Signed)
Orthopedic Tech Progress Note Patient Details:  Marie Tate [facility] [birth year] [id number]  Ortho Devices Type of Ortho Device: Velcro wrist splint Ortho Device/Splint Location: Left wrist Ortho Device/Splint Interventions: Application   Post Interventions Patient Tolerated: Well  Marie Tate 09/06/2022, 10:00 AM

## 2022-09-06 NOTE — ED Provider Notes (Signed)
[facility] DEPT Provider Note   CSN: [id number] Arrival date & time: 09/06/22  0102     History  Chief Complaint  Patient presents with   Arm Pain    Marie Tate is a 61 y.o. female.   Arm Pain   61 year old female presents emergency department with complaints of left forearm pain.  Patient reports intermittent pain over the past 5 to 7 days.  Reports associated pain in bilateral thighs as well.  Describes pain as "cramping type sensation" of which Marie Tate is taken muscle relaxers at home which has helped some.  Reports family history of heart attack and is concerned about the same.  Denies chest pain, shortness of breath, fever, chills, night sweats, abdominal pain, nausea, vomiting, urinary/vaginal symptoms, change in bowel habits.  Denies any known trauma/injury to affected arm.  Marie Tate has noted mild cough over the past several days of which has not changed in nature.  Past medical history significant for GERD, hypertension  Home Medications Prior to Admission medications   Medication Sig Start Date End Date Taking? Authorizing Provider  diclofenac (VOLTAREN) 75 MG EC tablet Take 1 tablet (75 mg total) by mouth 2 (two) times daily. Patient not taking: Reported on 11/08/2021 04/18/18   Vanessa Kick, MD  famotidine (PEPCID) 20 MG tablet Take 1 tablet (20 mg total) by mouth daily. Patient not taking: Reported on 11/08/2021 11/18/19   Nonda Lou S, PA-C  lidocaine (LIDODERM) 5 % Place 1 patch onto the skin daily. Remove & Discard patch within 12 hours or as directed by MD Patient not taking: Reported on 11/08/2021 07/20/21   Caccavale, Sophia, PA-C  lisinopril-hydrochlorothiazide (PRINZIDE,ZESTORETIC) 10-12.5 MG tablet Take 1 tablet by mouth daily. 02/27/16   [provider]  Magnesium 250 MG TABS Take 250 mg by mouth daily.    [provider]  methocarbamol (ROBAXIN) 500 MG tablet Take 1 tablet (500 mg total) by mouth at bedtime as  needed for muscle spasms. Patient not taking: Reported on 11/08/2021 07/20/21   Caccavale, Sophia, PA-C  nabumetone (RELAFEN) 500 MG tablet Take 1 tablet (500 mg total) by mouth 2 (two) times daily as needed. Patient not taking: Reported on 11/08/2021 10/14/17   Mcarthur Rossetti, MD  omeprazole (PRILOSEC) 40 MG capsule Take 40 mg by mouth daily.    [provider]  predniSONE (DELTASONE) 20 MG tablet Take 2 tablets (40 mg total) by mouth daily with breakfast. Patient not taking: Reported on 11/08/2021 04/13/21   Volney American, PA-C  predniSONE (DELTASONE) 50 MG tablet Take one tab daily. Patient not taking: Reported on 04/18/2018 12/13/16   Tereasa Coop, PA-C  sulfamethoxazole-trimethoprim (BACTRIM,SEPTRA) 400-80 MG tablet Take 1 tablet by mouth 2 (two) times daily. Patient not taking: Reported on 11/08/2021    [provider]  traMADol (ULTRAM) 50 MG tablet Take 1 tablet (50 mg total) by mouth every 8 (eight) hours as needed. Patient not taking: Reported on 04/18/2018 11/02/16   Scot Jun, FNP  traMADol (ULTRAM) 50 MG tablet Take 1 tablet (50 mg total) by mouth every 6 (six) hours as needed. Patient not taking: Reported on 04/18/2018 11/24/16   Lajean Saver, MD  VITAMIN D, CHOLECALCIFEROL, PO Take by mouth.    [provider]  vitamin E 1000 UNIT capsule Take 1,000 Units by mouth daily.    [provider]      Allergies    Patient has no known allergies.    Review of  Systems   Review of Systems  All other systems reviewed and are negative.   Physical Exam Updated Vital Signs BP (!) 152/83 (BP Location: Right Arm)   Pulse 72   Temp 98.4 F (36.9 C) (Oral)   Resp 16   LMP 06/03/2016 (Approximate)   SpO2 100%  Physical Exam Vitals and nursing note reviewed.  Constitutional:      General: Marie Tate is not in acute distress.    Appearance: Marie Tate is well-developed.  HENT:     Head: Normocephalic and atraumatic.  Eyes:      Conjunctiva/sclera: Conjunctivae normal.  Cardiovascular:     Rate and Rhythm: Normal rate and regular rhythm.     Pulses: Normal pulses.     Heart sounds: No murmur heard.    No friction rub. No gallop.  Pulmonary:     Effort: Pulmonary effort is normal. No respiratory distress.     Breath sounds: Normal breath sounds. No wheezing, rhonchi or rales.  Abdominal:     Palpations: Abdomen is soft.     Tenderness: There is no abdominal tenderness.  Musculoskeletal:        General: No swelling.     Cervical back: Neck supple.     Comments: Radial pulses full intact bilaterally.  No obvious swelling of left upper extremity.  Patient tender palpation of flexor muscles of left wrist with no obvious medial epicondyle tenderness.  Patient elicits pain with resisted flexion of left wrist.  Skin:    General: Skin is warm and dry.     Capillary Refill: Capillary refill takes less than 2 seconds.  Neurological:     Mental Status: Marie Tate is alert.  Psychiatric:        Mood and Affect: Mood normal.     ED Results / Procedures / Treatments   Labs (all labs ordered are listed, but only abnormal results are displayed) Labs Reviewed  COMPREHENSIVE METABOLIC PANEL - Abnormal; Notable for the following components:      Result Value   Glucose, Bld 100 (*)    All other components within normal limits  RESP PANEL BY RT-PCR (RSV, FLU A&B, COVID)  RVPGX2  CBC WITH DIFFERENTIAL/PLATELET  TROPONIN I (HIGH SENSITIVITY)    EKG None  Radiology DG Chest 1 View  Result Date: 09/06/2022 CLINICAL DATA:  Chest pain, LEFT arm pain EXAM: CHEST  1 VIEW COMPARISON:  Portable exam 0825 hours compared to 06/30/2022 FINDINGS: Normal heart size, mediastinal contours, and pulmonary vascularity. Lungs clear. No pulmonary infiltrate, pleural effusion, or pneumothorax. Osseous structures unremarkable. IMPRESSION: No acute abnormalities. Electronically Signed   By: Lavonia Dana M.D.   On: 09/06/2022 08:39     Procedures Procedures    Medications Ordered in ED Medications  ketorolac (TORADOL) 15 MG/ML injection 15 mg (15 mg Intramuscular Given 09/06/22 9242)    ED Course/ Medical Decision Making/ A&P                           Medical Decision Making Amount and/or Complexity of Data Reviewed Labs: ordered. Radiology: ordered.  Risk Prescription drug management.   This patient presents to the ED for concern of left arm pain, this involves an extensive number of treatment options, and is a complaint that carries with it a high risk of complications and morbidity.  The differential diagnosis includes ACS, fracture, strain/sprain, dislocation, electrolyte abnormalities, septic arthritis, muscular spasms   Co morbidities that complicate the patient evaluation  See  HPI   Additional history obtained:  Additional history obtained from EMR External records from outside source obtained and reviewed including hospital records   Lab Tests:  I Ordered, and personally interpreted labs.  The pertinent results include: No leukocytosis noted.  No evidence of anemia.  Platelets within normal range.  No electrolyte abnormalities noted.  Renal function within normal limits.  No transaminitis.  Troponin less than 2; EKG sinus rhythm without acute changes indicative of ischemia so doubt ACS.  Given patient's duration of symptoms, second troponin deemed unnecessary at this time.  Respiratory viral panel negative.   Imaging Studies ordered:  I ordered imaging studies including chest x-ray I independently visualized and interpreted imaging which showed no acute cardiopulmonary abnormalities I agree with the radiologist interpretation   Cardiac Monitoring: / EKG:  The patient was maintained on a cardiac monitor.  I personally viewed and interpreted the cardiac monitored which showed an underlying rhythm of: Sinus rhythm with no acute ischemic changes indicative of ischemia.   Consultations  Obtained:  N/a   Problem List / ED Course / Critical interventions / Medication management  Left arm pain I ordered medication including Toradol   Reevaluation of the patient after these medicines showed that the patient improved I have reviewed the patients home medicines and have made adjustments as needed   Social Determinants of Health:  Denies tobacco, illicit drug use   Test / Admission - Considered:  Left arm pain Vitals signs significant for hypertension with blood pressure 152/83.  Patient without her antihypertensives this morning so recommended to continue medicines once discharged from the emergency department. Otherwise within normal range and stable throughout visit. Laboratory/imaging studies significant for: See above Patient seen most concerned with cardiac etiology of left arm pain given family history of myocardial infarction despite individual history of cardiac abnormalities..  Patient with negative troponin with stable EKG from prior EKGs performed; second troponin administered at this time given duration of patient's symptoms.  Patient symptoms seem most likely muscular in origin given area of tenderness as well as reproducible on physical exam.  Patient placed in left wrist brace as well as recommended symptomatic therapy at home with rest, ice, elevation and NSAID therapy.  Follow-up with primary care recommended in 7 to 10 days for reevaluation.  Patient also given outpatient resources for cardiology given family history.  Treatment plan discussed at length with patient and Marie Tate acknowledged understanding was agreeable to said plan. Worrisome signs and symptoms were discussed with the patient, and the patient acknowledged understanding to return to the ED if noticed. Patient was stable upon discharge.          Final Clinical Impression(s) / ED Diagnoses Final diagnoses:  Left arm pain    Rx / DC Orders ED Discharge Orders     None          Wilnette Kales, PA 09/06/22 0076    Fredia Sorrow, MD 09/09/22 [phone]

## 2022-09-10 ENCOUNTER — Ambulatory Visit: Payer: Medicaid Other | Admitting: Emergency Medicine

## 2022-09-11 ENCOUNTER — Ambulatory Visit: Payer: Medicaid Other | Admitting: Family Medicine

## 2022-09-18 ENCOUNTER — Ambulatory Visit: Payer: Medicaid Other | Admitting: Emergency Medicine

## 2022-11-12 ENCOUNTER — Ambulatory Visit: Payer: Medicaid Other | Admitting: Family Medicine

## 2022-12-03 ENCOUNTER — Encounter: Payer: Self-pay | Admitting: Family Medicine

## 2022-12-03 ENCOUNTER — Ambulatory Visit: Payer: Medicaid Other | Admitting: Family Medicine

## 2022-12-03 VITALS — BP 134/80 | HR 73 | Temp 98.1°F | Ht 66.0 in | Wt 213.0 lb

## 2022-12-03 DIAGNOSIS — M1711 Unilateral primary osteoarthritis, right knee: Secondary | ICD-10-CM | POA: Diagnosis not present

## 2022-12-03 DIAGNOSIS — K760 Fatty (change of) liver, not elsewhere classified: Secondary | ICD-10-CM | POA: Diagnosis not present

## 2022-12-03 DIAGNOSIS — E66811 Obesity, class 1: Secondary | ICD-10-CM

## 2022-12-03 DIAGNOSIS — Z131 Encounter for screening for diabetes mellitus: Secondary | ICD-10-CM

## 2022-12-03 DIAGNOSIS — R7303 Prediabetes: Secondary | ICD-10-CM | POA: Insufficient documentation

## 2022-12-03 DIAGNOSIS — I10 Essential (primary) hypertension: Secondary | ICD-10-CM

## 2022-12-03 DIAGNOSIS — Z6836 Body mass index (BMI) 36.0-36.9, adult: Secondary | ICD-10-CM | POA: Insufficient documentation

## 2022-12-03 DIAGNOSIS — Z13220 Encounter for screening for lipoid disorders: Secondary | ICD-10-CM

## 2022-12-03 DIAGNOSIS — E6609 Other obesity due to excess calories: Secondary | ICD-10-CM | POA: Diagnosis not present

## 2022-12-03 DIAGNOSIS — Z6834 Body mass index (BMI) 34.0-34.9, adult: Secondary | ICD-10-CM | POA: Diagnosis not present

## 2022-12-03 DIAGNOSIS — K769 Liver disease, unspecified: Secondary | ICD-10-CM | POA: Insufficient documentation

## 2022-12-03 LAB — HEMOGLOBIN A1C: Hgb A1c MFr Bld: 6.2 % (ref 4.6–6.5)

## 2022-12-03 LAB — LIPID PANEL
Cholesterol: 198 mg/dL (ref 0–200)
HDL: 63.3 mg/dL (ref >39.00)
LDL Cholesterol: 118 mg/dL — ABNORMAL HIGH (ref 0–99)
NonHDL: 134.52
Total CHOL/HDL Ratio: 3
Triglycerides: 84 mg/dL (ref 0.0–149.0)
VLDL: 16.8 mg/dL (ref 0.0–40.0)

## 2022-12-03 MED ORDER — LISINOPRIL-HYDROCHLOROTHIAZIDE 10-12.5 MG PO TABS: 1.0000 | ORAL_TABLET | Freq: Every day | ORAL | 3 refills | Status: DC

## 2022-12-03 NOTE — Assessment & Plan Note (Signed)
Discussed non-weight bearing exercises, such as pool exercises, to help with ongoing weight loss efforts.

## 2022-12-03 NOTE — Assessment & Plan Note (Signed)
Continue to follow with orthopedics. I recommend avoiding NSAIDs in light of blood pressure issues.

## 2022-12-03 NOTE — Assessment & Plan Note (Signed)
Recent LFTs normal. Discussed ongoing efforts at weight loss. GI recommends a repeat ultrasound in 1-2 years.

## 2022-12-03 NOTE — Assessment & Plan Note (Signed)
Blood pressure is in adequate control. Continue Zestoretic 10-12.5 mg daily.

## 2022-12-03 NOTE — Progress Notes (Signed)
[facility] [address] [place] [address] Dept: [phone] Dept Fax: [phone]  New Patient Office Visit  Subjective:    Patient ID: Marie Tate, female    DOB: 09-14-1961, 62 y.o..   MRN: [id number]  Chief Complaint  Patient presents with   Establish Care    NP- establish care. Fasting today.      History of Present Illness:  Patient is in today to establish care. Ms. Marie Tate was born in [place]. She attended [employer] for 1 1/2 years studying Therapist, occupational. She worked in either office settings or doing picking/packing. However, currently she is disabled due to bilateral knee arthritis, esp. on the right knee. She has been married for 25 years. She has two daughters (38, 88) and one grandson (33).  She denies use of tobacco or drugs. She drinks alcohol occasionally.  Ms. Marie Tate has a history of bilateral knee pain, though her right knee is much worse. She notes she got a piece of glass in her knee when she was 62 years old, so this knee has always given her some trouble. She does receive knee joint injections periodically.  Ms. Marie Tate has a history of  hypertension. She is managed on Zestoretic 10-12.5 mg daily.   Ms. Marie Tate has a history of obesity. She notes that her highest weight was 232 lbs. she was able to loose some weigh ton her won, before going on Ozempic for 2 months. She lost down to 198 lbs, but then her insurance stopped covering Downs. She has noted some weight returning. She admits to difficulty exercising due to knee arthritis, but she stills tries to do some of this.  Ms. Marie Tate has a history of hepatic steatosis. She has seen GI int he past. She is managed currently with lifestyle changes and efforts at weight loss.  Past Medical History: Patient Active Problem List   Diagnosis Date Noted   Essential hypertension 12/03/2022   Hepatic steatosis 12/03/2022   Liver lesion, right lobe  12/03/2022   Class 1 obesity due to excess calories with body mass index (BMI) of 34.0 to 34.9 in adult 12/03/2022   Unilateral primary osteoarthritis, right knee 04/02/2017   Bacterial vaginosis 07/09/2016   Epigastric pain 07/09/2016   Dysuria 07/09/2016   Hiatal hernia 07/09/2016   GERD (gastroesophageal reflux disease) 07/09/2016   Past Surgical History:  Procedure Laterality Date   UMBILICAL HERNIA REPAIR     UTERINE FIBROID SURGERY     x 2   Family History  Problem Relation Age of Onset   Hypertension Mother    Heart attack Mother    Hypertension Father    Stroke Father    Cancer Sister        Stomach(?)   Kidney disease Sister    Diabetes Brother    Seizures Brother    Heart attack Brother    Stomach cancer Maternal Aunt    Tuberculosis Other    Colon cancer Neg Hx    Esophageal cancer Neg Hx    Liver cancer Neg Hx    Pancreatic cancer Neg Hx    Outpatient Medications Prior to Visit  Medication Sig Dispense Refill   Flaxseed, Linseed, (FLAX SEED OIL) 1000 MG CAPS Take by mouth.     Magnesium 250 MG TABS Take 250 mg by mouth daily.     methocarbamol (ROBAXIN) 500 MG tablet Take 1 tablet (500 mg total) by mouth at bedtime as needed for muscle spasms. 10  tablet 0   VITAMIN D, CHOLECALCIFEROL, PO Take by mouth.     vitamin E 1000 UNIT capsule Take 1,000 Units by mouth daily.     lisinopril-hydrochlorothiazide (PRINZIDE,ZESTORETIC) 10-12.5 MG tablet Take 1 tablet by mouth daily.  3   omeprazole (PRILOSEC) 40 MG capsule Take 40 mg by mouth daily.     diclofenac (VOLTAREN) 75 MG EC tablet Take 1 tablet (75 mg total) by mouth 2 (two) times daily. (Patient not taking: Reported on 11/08/2021) 14 tablet 0   famotidine (PEPCID) 20 MG tablet Take 1 tablet (20 mg total) by mouth daily. (Patient not taking: Reported on 11/08/2021) 14 tablet 0   lidocaine (LIDODERM) 5 % Place 1 patch onto the skin daily. Remove & Discard patch within 12 hours or as directed by MD (Patient not  taking: Reported on 11/08/2021) 30 patch 0   nabumetone (RELAFEN) 500 MG tablet Take 1 tablet (500 mg total) by mouth 2 (two) times daily as needed. (Patient not taking: Reported on 11/08/2021) 180 tablet 1   predniSONE (DELTASONE) 20 MG tablet Take 2 tablets (40 mg total) by mouth daily with breakfast. (Patient not taking: Reported on 11/08/2021) 10 tablet 0   predniSONE (DELTASONE) 50 MG tablet Take one tab daily. (Patient not taking: Reported on 04/18/2018) 3 tablet 0   sulfamethoxazole-trimethoprim (BACTRIM,SEPTRA) 400-80 MG tablet Take 1 tablet by mouth 2 (two) times daily. (Patient not taking: Reported on 11/08/2021)     traMADol (ULTRAM) 50 MG tablet Take 1 tablet (50 mg total) by mouth every 8 (eight) hours as needed. (Patient not taking: Reported on 04/18/2018) 20 tablet 0   traMADol (ULTRAM) 50 MG tablet Take 1 tablet (50 mg total) by mouth every 6 (six) hours as needed. (Patient not taking: Reported on 04/18/2018) 15 tablet 0   No facility-administered medications prior to visit.   No Known Allergies Objective:   Today's Vitals   12/03/22 0841  BP: 134/80  Pulse: 73  Temp: 98.1 F (36.7 C)  TempSrc: Temporal  SpO2: 98%  Weight: 213 lb (96.6 kg)  Height: 5\' 6"  (1.676 m)   Body mass index is 34.38 kg/m.   General: Well developed, well nourished. No acute distress. Psych: Alert and oriented. Normal mood and affect.  Health Maintenance Due  Topic Date Due   COVID-19 Vaccine (1) Never done   HIV Screening  Never done   DTaP/Tdap/Td (1 - Tdap) Never done   COLONOSCOPY (Pts 45-77yrs Insurance coverage will need to be confirmed)  Never done   MAMMOGRAM  07/26/2016   PAP SMEAR-Modifier  01/02/2019   Zoster Vaccines- Shingrix (2 of 2) 08/17/2022   Lab Results    Latest Ref Rng & Units 09/06/2022    8:30 AM 06/30/2022    5:05 PM 12/21/2021    2:26 PM  CBC  WBC 4.0 - 10.5 K/uL 7.9  8.9  10.0   Hemoglobin 12.0 - 15.0 g/dL 12.4  12.7  13.4   Hematocrit 36.0 - 46.0 % 40.0  40.3  39.7    Platelets 150 - 400 K/uL 275  332  299        Latest Ref Rng & Units 09/06/2022    8:30 AM 06/30/2022    5:05 PM 12/21/2021    2:26 PM  CMP  Glucose 70 - 99 mg/dL 100  119  93   BUN 6 - 20 mg/dL 12  14  19    Creatinine 0.44 - 1.00 mg/dL 0.73  0.83  0.85  Sodium 135 - 145 mmol/L 141  140  137   Potassium 3.5 - 5.1 mmol/L 3.6  4.2  3.3   Chloride 98 - 111 mmol/L 107  105  103   CO2 22 - 32 mmol/L 27  28  25    Calcium 8.9 - 10.3 mg/dL 9.2  9.5  9.4   Total Protein 6.5 - 8.1 g/dL 7.0   8.0   Total Bilirubin 0.3 - 1.2 mg/dL 0.6   0.6   Alkaline Phos 38 - 126 U/L 77   84   AST 15 - 41 U/L 16   20   ALT 0 - 44 U/L 16   21       Assessment & Plan:   Problem List Items Addressed This Visit       Cardiovascular and Mediastinum   Essential hypertension - Primary    Blood pressure is in adequate control. Continue Zestoretic 10-12.5 mg daily.       Relevant Medications   lisinopril-hydrochlorothiazide (ZESTORETIC) 10-12.5 MG tablet     Digestive   Hepatic steatosis    Recent LFTs normal. Discussed ongoing efforts at weight loss. GI recommends a repeat ultrasound in 1-2 years.        Musculoskeletal and Integument   Unilateral primary osteoarthritis, right knee    Continue to follow with orthopedics. I recommend avoiding NSAIDs in light of blood pressure issues.        Other   Class 1 obesity due to excess calories with body mass index (BMI) of 34.0 to 34.9 in adult    Discussed non-weight bearing exercises, such as pool exercises, to help with ongoing weight loss efforts.      Other Visit Diagnoses     Screening for lipid disorders       Relevant Orders   Lipid panel   Screening for diabetes mellitus (DM)       Relevant Orders   Hemoglobin A1c       Return in about 3 months (around 03/05/2023) for Reassessment.   Haydee Salter, MD

## 2022-12-17 ENCOUNTER — Telehealth: Payer: Self-pay | Admitting: Family Medicine

## 2022-12-17 DIAGNOSIS — E6609 Other obesity due to excess calories: Secondary | ICD-10-CM

## 2022-12-17 DIAGNOSIS — R7303 Prediabetes: Secondary | ICD-10-CM

## 2022-12-17 NOTE — Telephone Encounter (Signed)
Pt is wanting to discuss her most recent labs and what she can do to help with these results. Please advise pt at [phone]

## 2022-12-18 NOTE — Telephone Encounter (Signed)
Patient is interested in going to a dietary referral.  Can you place the order?   Thanks. Dm/cma

## 2022-12-18 NOTE — Addendum Note (Signed)
Addended by: Haydee Salter on: 12/18/2022 11:38 AM   Modules accepted: Orders

## 2022-12-18 NOTE — Telephone Encounter (Signed)
Pt is checking on status of this message.  °

## 2023-01-07 ENCOUNTER — Encounter: Payer: Self-pay | Admitting: Sports Medicine

## 2023-01-07 ENCOUNTER — Other Ambulatory Visit (INDEPENDENT_AMBULATORY_CARE_PROVIDER_SITE_OTHER): Payer: Medicaid Other

## 2023-01-07 ENCOUNTER — Ambulatory Visit (INDEPENDENT_AMBULATORY_CARE_PROVIDER_SITE_OTHER): Payer: BC Managed Care – PPO | Admitting: Sports Medicine

## 2023-01-07 DIAGNOSIS — M25562 Pain in left knee: Secondary | ICD-10-CM

## 2023-01-07 DIAGNOSIS — E6609 Other obesity due to excess calories: Secondary | ICD-10-CM

## 2023-01-07 DIAGNOSIS — Z6834 Body mass index (BMI) 34.0-34.9, adult: Secondary | ICD-10-CM

## 2023-01-07 DIAGNOSIS — M170 Bilateral primary osteoarthritis of knee: Secondary | ICD-10-CM

## 2023-01-07 DIAGNOSIS — G8929 Other chronic pain: Secondary | ICD-10-CM

## 2023-01-07 MED ORDER — MELOXICAM 15 MG PO TABS: 15.0000 mg | ORAL_TABLET | Freq: Every day | ORAL | 1 refills | Status: DC

## 2023-01-07 NOTE — Patient Instructions (Signed)
1.)  Keep your body moving - keep working on flexibility and range of motion for the knee. Good physical activity exercise include: stationary bike, swimming, elliptical.  2.)  Maintain a healthy weight --> 1 pound of body weight = 4 pounds of weight on the knees.  Even small weight loss (5-10+ lbs) can significantly improve pain  3.)  Supplements helpful for arthritis and inflammation: Turmeric (curcumin) Boswellia serrata, collagen hydrolysate, Glucosamine-chondroitin 4.)  Foods helpful for arthritis and inflammation: Fish and foods containing omega-3's, leafy greens (broccoli, spinach), citrus fruits (grapefruit, oranges, lemons), green tea, blueberries, cherries, olive oil (EVO)  *It is very important to stay hydrated, drinking lots of water throughout the day. This helps hydrate structures within the knee.  5.) Medicines:    - Topical pain relievers: Voltaren gel, Arnica gel, Tiger balm, lidocaine/IcyHot patches    - Anti-inflammatories like Aleve, Motrin, ibuprofen --> we can consider prescription based NSAIDs as well if needed  

## 2023-01-07 NOTE — Progress Notes (Addendum)
Marie Tate - 62 y.o. female MRN [id number]  Date of birth: 10/05/1960  Office Visit Note: Visit Date: 01/07/2023 PCP: Loyola Mast, MD Referred by: Loyola Mast, MD  Subjective: Chief Complaint  Patient presents with   Left Knee - Pain   HPI: Marie Tate is a very pleasant 62 y.o. female who presents today for acute on chronic left knee pain.  Has a history of bilateral knee pain with infrequent injections.  She has had pain on and off for both knees for several years.  He recently over the past few weeks she has had an exacerbation of her left knee pain.  Pain is over the medial aspect of the knee.  Does have some swelling but she has been resting it and elevating it with some improvement.  No specific injury or inciting event.  She does take muscle relaxers and Tylenol for pain but only with minimal relief.  Does report having a corticosteroid injection into the right knee back in November which helped her symptoms.  She is working on weight loss, previously lost 20 pounds months ago but has gained 11 pounds back.  Does like walking for exercise.  Pertinent ROS were reviewed with the patient and found to be negative unless otherwise specified above in HPI.   Assessment & Plan: Visit Diagnoses:  1. Chronic pain of left knee   2. Bilateral primary osteoarthritis of knee   3. Class 1 obesity due to excess calories with serious comorbidity and body mass index (BMI) of 34.0 to 34.9 in adult    Plan: Discussed with Marie Tate the nature of her L > R knee pain.  She does have advanced medial tibiofemoral joint arthritis of both knees, although her left one has been exacerbated over the past few weeks.  We discussed all treatment options such as oral medication, home versus physical therapy, injection therapy, advanced imaging, surgical indications.  She agreed to proceed with corticosteroid injection, patient tolerated well.  To help with both knees and her arthritislike  pain, we will start her on meloxicam 15 mg to be taken once daily.  Once her acute pain settles down she may transition to as needed.  Did discuss healthy weight loss, handout was provided for knee arthritis, see AVS.  May use ice, rest and Tylenol for any postinjection pain.  I would like her to follow-up in about 6 weeks for reevaluation.  Additional treatment considerations: Unloader bracing, formalized physical therapy versus home therapy, MRI  Follow-up: Return in about 6 weeks (around 02/18/2023) for for L > R knee pain.   Meds & Orders:  Meds ordered this encounter  Medications   meloxicam (MOBIC) 15 MG tablet    Sig: Take 1 tablet (15 mg total) by mouth daily.    Dispense:  30 tablet    Refill:  1    Orders Placed This Encounter  Procedures   Large Joint Inj   XR Knee Complete 4 Views Left     Procedures: Large Joint Inj: L knee on 02/18/2023 4:06 PM Details: 22 G 1.5 in needle, anteromedial approach Medications: 2 mL lidocaine 1 %; 2 mL bupivacaine 0.25 %; 80 mg methylPREDNISolone acetate 40 MG/ML Outcome: tolerated well, no immediate complications Procedure, treatment alternatives, risks and benefits explained, specific risks discussed. Consent was given by the patient. Immediately prior to procedure a time out was called to verify the correct patient, procedure, equipment, support staff and site/side marked as required. Patient was prepped and  draped in the usual sterile fashion.          Clinical History: No specialty comments available.  She reports that she quit smoking about 14 years ago. Her smoking use included cigarettes. She smoked an average of .25 packs per day. She has never used smokeless tobacco.  Recent Labs    12/03/22 0923  HGBA1C 6.2    Objective:   Vital Signs: LMP 06/03/2016 (Approximate)   Physical Exam  Gen: Well-appearing, in no acute distress; non-toxic CV: Regular Rate. Well-perfused. Warm.  Resp: Breathing unlabored on room air; no  wheezing. Psych: Fluid speech in conversation; appropriate affect; normal thought process Neuro: Sensation intact throughout. No gross coordination deficits.   Ortho Exam -There is mild varus deformity upon standing bilaterally, left greater than right.  Left knee range of motion 0-130 degrees.  There is pseudo instability with valgus stress with pain. + TTP over the medial joint line.  No significant effusion.  Knee strength 5/5 in all directions.  Imaging: No results found.  Past Medical/Family/Surgical/Social History: Medications & Allergies reviewed per EMR, new medications updated. Patient Active Problem List   Diagnosis Date Noted   Essential hypertension 12/03/2022   Hepatic steatosis 12/03/2022   Liver lesion, right lobe 12/03/2022   Class 1 obesity due to excess calories with body mass index (BMI) of 34.0 to 34.9 in adult 12/03/2022   Prediabetes 12/03/2022   Unilateral primary osteoarthritis, right knee 04/02/2017   Bacterial vaginosis 07/09/2016   Epigastric pain 07/09/2016   Dysuria 07/09/2016   Hiatal hernia 07/09/2016   GERD (gastroesophageal reflux disease) 07/09/2016   Past Medical History:  Diagnosis Date   Acid reflux    Fatty liver    Hypertension    Family History  Problem Relation Age of Onset   Hypertension Mother    Heart attack Mother    Hypertension Father    Stroke Father    Cancer Sister        Stomach(?)   Kidney disease Sister    Diabetes Brother    Seizures Brother    Heart attack Brother    Stomach cancer Maternal Aunt    Tuberculosis Other    Colon cancer Neg Hx    Esophageal cancer Neg Hx    Liver cancer Neg Hx    Pancreatic cancer Neg Hx    Past Surgical History:  Procedure Laterality Date   UMBILICAL HERNIA REPAIR     UTERINE FIBROID SURGERY     x 2   Social History   Occupational History   Occupation: Disabled  Tobacco Use   Smoking status: Former    Packs/day: .25    Types: Cigarettes    Quit date: 2010    Years  since quitting: 14.4   Smokeless tobacco: Never  Vaping Use   Vaping Use: Never used  Substance and Sexual Activity   Alcohol use: Yes    Comment: occ   Drug use: No   Sexual activity: Yes    Birth control/protection: Surgical

## 2023-01-07 NOTE — Progress Notes (Signed)
Pain for several years Complains of stiffness No injury Takes muscle relaxers and occasional tylenol for pain; minimal relief

## 2023-01-20 ENCOUNTER — Encounter: Payer: Self-pay | Admitting: Dietician

## 2023-01-20 ENCOUNTER — Encounter: Payer: BC Managed Care – PPO | Attending: Family Medicine | Admitting: Dietician

## 2023-01-20 VITALS — Ht 66.0 in | Wt 213.3 lb

## 2023-01-20 DIAGNOSIS — R7303 Prediabetes: Secondary | ICD-10-CM | POA: Diagnosis present

## 2023-01-20 DIAGNOSIS — E669 Obesity, unspecified: Secondary | ICD-10-CM

## 2023-01-20 NOTE — Progress Notes (Signed)
Medical Nutrition Therapy  Appointment Start time:  1:56  Appointment End time:  2:58  Primary concerns today:    Referral diagnosis: Prediabetes and Obesity Preferred learning style: no preference indicated (auditory, visual, hands on, no preference indicated) Learning readiness: preparation (not ready, contemplating, ready, change in progress)   NUTRITION ASSESSMENT   Anthropometrics  Height: 66 in Weight 213.3 lbs  Clinical Medical Hx: GERD, HTN, obesity, prediabetes Medications: Flaxseed, Linseed, (FLAX SEED OIL) 1000 MG CAPS; lisinopril-hydrochlorothiazide (ZESTORETIC) 10-12.5 MG tablet; Magnesium 250 MG TABS meloxicam (MOBIC) 15 MG tablet; VITAMIN D Labs: A1c 6.2; LDL 118; glucose 100 Notable Signs/Symptoms: none noted  Lifestyle & Dietary Hx  Pt states she was on Ozempic for two months. Pt states she saw a dietitian at [facility] about a year ago.  Pt states she lost 15 lbs on her own and then another 13 pounds on ozempic, then she could not get Ozempic anymore and states she may have gain about 11-12 pounds back. Pt states she is allergic to blackberries and shell fish/ shrimp. Pt states she has night sweats at night and sometimes during the day, stating she attributes it to menopause, but states when she was on Ozempic they went away and then when she came off Ozempic, the night sweats came back double.  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 Arthritis in knees.  24-Hr Dietary Recall First Meal: coffee, skip or egg whites with onions (omelet) Snack:  Second Meal: snack bar or apple Snack:  Third Meal: air fried chicken, broccoli, rice or sometimes take out or salmon Snack: sometimes chips Beverages: water, coffee, hot tea  Estimated Energy Needs Calories:  1500   NUTRITION DIAGNOSIS  NB-1.5 Disordered eating pattern As related to skipping meals.  As evidenced by dietary recall.   NUTRITION INTERVENTION  Nutrition education (E-1) on the following topics:  Encouraged patient to honor their body's internal hunger and fullness cues.  Throughout the day, check in mentally and rate hunger. Stop eating when satisfied not full regardless of how much food is left on the plate.  Get more if still hungry 20-30 minutes later.  The key is to honor satisfaction so throughout the meal, rate fullness factor and stop when comfortably satisfied not physically full. The key is to honor hunger and fullness without any feelings of guilt or shame.  Pay attention to what the internal cues are, rather than any external factors. This will enhance the confidence you have in listening to your own body and following those internal cues enabling you to increase how often you eat when you are hungry not out of appetite and stop when you are satisfied not full.  Encouraged pt to continue to eat balanced meals inclusive of non starchy vegetables 2 times a day 7 days a week Encouraged pt to choose lean protein sources: limiting beef, pork, sausage, hotdogs, and lunch meat Encourage pt to choose healthy fats such as plant based limiting animal fats Encouraged pt to continue to drink a minium 64 fluid ounces with half being plain water to satisfy proper hydration   Why you need complex carbohydrates: Whole grains and other complex carbohydrates are required to have a healthy diet. Whole grains provide fiber which can help with blood glucose levels and help keep you satiated. Fruits and  starchy vegetables provide essential vitamins and minerals required for immune function, eyesight support, brain support, bone density, wound healing and many other functions within the body. According to the current evidenced based 2020-2025 Dietary Guidelines for Americans, complex carbohydrates are part  of a healthy eating pattern which is associated with a decreased risk for type 2 diabetes, cancers, and cardiovascular disease.  Health benefits of physical activity  Handouts Provided Include  Meal Ideas handout Health Benefits of Physical Activity Types of Fat  Learning Style & Readiness for Change Teaching method utilized: Visual & Auditory  Demonstrated degree of understanding via: Teach Back  Barriers to learning/adherence to lifestyle change: mobility/arthritis  Goals Established by Pt Avoid skipping meals; meal prep and have food on hand for meal ideas, using meal ideas handout. Reduce fat intake by reducing saturated fat. Choose lean cuts of meat, and prepare meats by grilling, baking, air frying, etc. Increase physical activity; aim for 150 minutes of moderate activity  MONITORING & EVALUATION Dietary intake, weekly physical activity.  Next Steps  Patient is to return in August for follow-up.

## 2023-01-29 ENCOUNTER — Telehealth: Payer: Self-pay

## 2023-01-29 NOTE — Telephone Encounter (Signed)
Patient called stating that her knee has been cramping and having spasms since receiving cortisone injection in her knee.  Would like a call back to discuss.  CB# [phone].  Please advise.  Thank you.

## 2023-01-29 NOTE — Telephone Encounter (Signed)
Dr. Steward Drone completed call to patient

## 2023-02-18 ENCOUNTER — Encounter: Payer: Self-pay | Admitting: Sports Medicine

## 2023-02-18 ENCOUNTER — Ambulatory Visit (INDEPENDENT_AMBULATORY_CARE_PROVIDER_SITE_OTHER): Payer: BC Managed Care – PPO | Admitting: Sports Medicine

## 2023-02-18 DIAGNOSIS — M170 Bilateral primary osteoarthritis of knee: Secondary | ICD-10-CM

## 2023-02-18 DIAGNOSIS — G8929 Other chronic pain: Secondary | ICD-10-CM

## 2023-02-18 DIAGNOSIS — E6609 Other obesity due to excess calories: Secondary | ICD-10-CM | POA: Diagnosis not present

## 2023-02-18 DIAGNOSIS — Z6834 Body mass index (BMI) 34.0-34.9, adult: Secondary | ICD-10-CM | POA: Diagnosis not present

## 2023-02-18 DIAGNOSIS — M25562 Pain in left knee: Secondary | ICD-10-CM

## 2023-02-18 MED ORDER — LIDOCAINE HCL 1 % IJ SOLN
2.0000 mL | INTRAMUSCULAR | Status: AC | PRN
  Administered 2023-02-18: 2 mL

## 2023-02-18 MED ORDER — BUPIVACAINE HCL 0.25 % IJ SOLN
2.0000 mL | INTRAMUSCULAR | Status: AC | PRN
  Administered 2023-02-18: 2 mL via INTRA_ARTICULAR

## 2023-02-18 MED ORDER — METHYLPREDNISOLONE ACETATE 40 MG/ML IJ SUSP
80.0000 mg | INTRAMUSCULAR | Status: AC | PRN
  Administered 2023-02-18: 80 mg via INTRA_ARTICULAR

## 2023-02-18 NOTE — Patient Instructions (Signed)
Dr. Petr Bontempo' Guide for Management of Osteoarthritis:  1.)  Keep your body moving - keep working on flexibility and range of motion for the knee. Good physical activity exercise include: stationary bike, swimming, elliptical.  2.)  Maintain a healthy weight --> 1 pound of body weight = 4 pounds of weight on the knees.  Even small weight loss (5-10+ lbs) can significantly improve pain  3.)  Supplements helpful for arthritis and inflammation: Turmeric (curcumin) Boswellia serrata, collagen hydrolysate, Glucosamine-chondroitin 4.)  Foods helpful for arthritis and inflammation: Fish and foods containing omega-3's, leafy greens (broccoli, spinach), citrus fruits (grapefruit, oranges, lemons), green tea, blueberries, cherries, olive oil (EVO)  *It is very important to stay hydrated, drinking lots of water throughout the day. This helps hydrate structures within the knee.  5.) Medicines:    - Topical pain relievers: Voltaren gel, Arnica gel, Tiger balm, lidocaine/IcyHot patches    - Anti-inflammatories like Aleve, Motrin, ibuprofen --> we can consider prescription based NSAIDs as well if needed  

## 2023-02-18 NOTE — Progress Notes (Signed)
Marie Tate [place] - 61 y.o. female MRN [id number]  Date of birth: 08-02-1961  Office Visit Note: Visit Date: 02/18/2023 PCP: Marie Mast, MD Referred by: Marie Mast, MD  Subjective: Chief Complaint  Patient presents with   Left Knee - Pain   HPI: Marie Tate is a very pleasant 62 y.o. female who presents today for acute on chronic left knee pain with known OA.  She does have known arthritis in both knees that have bothered on and off but her left one has been more bothersome recently.  Underwent corticosteroid injection into the left knee on 4/23, states she did not think this helped much.  Her pain does come and go, usually more so over the medial compartment.  Is taking meloxicam 15 mg daily as needed, this does help relieve some of her pain.  Sounds like she may have gotten a post steroid flare from the last injection.  Her right knee pain bothers her at times, she had previously had an injection months before that did certainly help her pain.  She is working on weight loss activity as able.  Pertinent ROS were reviewed with the patient and found to be negative unless otherwise specified above in HPI.   Assessment & Plan: Visit Diagnoses:  1. Bilateral primary osteoarthritis of knee   2. Chronic pain of left knee   3. Class 1 obesity due to excess calories with serious comorbidity and body mass index (BMI) of 34.0 to 34.9 in adult    Plan: Discussed with Marie Tate treatment options for her left knee, her x-rays does show essentially bone-on-bone change of the medial tibiofemoral compartment.  She unfortunately did not get much relief from the corticosteroid injection at this last time although she has received good relief in the past for this knee in the contralateral knee.  Given the degree of medial varus collapse, we will fit her into an unloader brace for her to wear with activity.  We will also get her started in formalized physical therapy to help offload the knee.   She can continue meloxicam 15 mg as needed.  Will work on PT and strengthening of both of her knees.  Would like to see what sort of progress she makes in 6 weeks.  Would like her to work on continued weight loss, provided handout for osteoarthritis conservative care, see AVS.  Additional treatment considerations may include additional attempt at corticosteroid injection (palpation vs. US-guided), viscosupplementation, MRI of knee.  She is not at the point where she would like to have discussion on knee replacement, although if her medial joint continues to be bothersome, her arthritic change does show rather advanced arthritis.  Follow-up: Return in about 6 weeks (around 04/01/2023) for f/u 6 weeks after starting physical therapy.   Meds & Orders: No orders of the defined types were placed in this encounter.   Orders Placed This Encounter  Procedures   Ambulatory referral to Physical Therapy     Procedures: No procedures performed      Clinical History: No specialty comments available.  She reports that she quit smoking about 14 years ago. Her smoking use included cigarettes. She smoked an average of .25 packs per day. She has never used smokeless tobacco.  Recent Labs    12/03/22 0923  HGBA1C 6.2    Objective:   Vital Signs: LMP 06/03/2016 (Approximate)   Physical Exam  Gen: Well-appearing, in no acute distress; non-toxic CV:  Well-perfused. Warm.  Resp: Breathing unlabored  on room air; no wheezing. Psych: Fluid speech in conversation; appropriate affect; normal thought process Neuro: Sensation intact throughout. No gross coordination deficits.   Ortho Exam - Bilateral knees: No significant effusions of either knee.  There is mild varus deformity upon standing bilaterally with the left more prominent.  Range of motion of the left knee 0-130 degrees.  There is pseudo instability with valgus stress of the left knee, continued positive TTP over the medial joint line.  5/5 strength  with knee flexion and extension.  Imaging: No results found.  Past Medical/Family/Surgical/Social History: Medications & Allergies reviewed per EMR, new medications updated. Patient Active Problem List   Diagnosis Date Noted   Essential hypertension 12/03/2022   Hepatic steatosis 12/03/2022   Liver lesion, right lobe 12/03/2022   Class 1 obesity due to excess calories with body mass index (BMI) of 34.0 to 34.9 in adult 12/03/2022   Prediabetes 12/03/2022   Unilateral primary osteoarthritis, right knee 04/02/2017   Bacterial vaginosis 07/09/2016   Epigastric pain 07/09/2016   Dysuria 07/09/2016   Hiatal hernia 07/09/2016   GERD (gastroesophageal reflux disease) 07/09/2016   Past Medical History:  Diagnosis Date   Acid reflux    Fatty liver    Hypertension    Family History  Problem Relation Age of Onset   Hypertension Mother    Heart attack Mother    Hypertension Father    Stroke Father    Cancer Sister        Stomach(?)   Kidney disease Sister    Diabetes Brother    Seizures Brother    Heart attack Brother    Stomach cancer Maternal Aunt    Tuberculosis Other    Colon cancer Neg Hx    Esophageal cancer Neg Hx    Liver cancer Neg Hx    Pancreatic cancer Neg Hx    Past Surgical History:  Procedure Laterality Date   UMBILICAL HERNIA REPAIR     UTERINE FIBROID SURGERY     x 2   Social History   Occupational History   Occupation: Disabled  Tobacco Use   Smoking status: Former    Packs/day: .25    Types: Cigarettes    Quit date: 2010    Years since quitting: 14.4   Smokeless tobacco: Never  Vaping Use   Vaping Use: Never used  Substance and Sexual Activity   Alcohol use: Yes    Comment: occ   Drug use: No   Sexual activity: Yes    Birth control/protection: Surgical

## 2023-02-18 NOTE — Progress Notes (Signed)
Still having a lot of left knee pain States that the injection made pain worse

## 2023-03-05 ENCOUNTER — Ambulatory Visit: Payer: Medicaid Other | Admitting: Family Medicine

## 2023-03-11 ENCOUNTER — Encounter: Payer: Self-pay | Admitting: Family Medicine

## 2023-03-11 ENCOUNTER — Telehealth: Payer: Self-pay | Admitting: Family Medicine

## 2023-03-11 ENCOUNTER — Ambulatory Visit (INDEPENDENT_AMBULATORY_CARE_PROVIDER_SITE_OTHER): Payer: BC Managed Care – PPO | Admitting: Family Medicine

## 2023-03-11 VITALS — BP 136/80 | HR 63 | Temp 98.7°F | Ht 66.0 in | Wt 217.8 lb

## 2023-03-11 DIAGNOSIS — R7303 Prediabetes: Secondary | ICD-10-CM | POA: Diagnosis not present

## 2023-03-11 DIAGNOSIS — I10 Essential (primary) hypertension: Secondary | ICD-10-CM | POA: Diagnosis not present

## 2023-03-11 DIAGNOSIS — R0600 Dyspnea, unspecified: Secondary | ICD-10-CM

## 2023-03-11 DIAGNOSIS — M79602 Pain in left arm: Secondary | ICD-10-CM

## 2023-03-11 MED ORDER — OZEMPIC (0.25 OR 0.5 MG/DOSE) 2 MG/3ML ~~LOC~~ SOPN: 0.2500 mg | PEN_INJECTOR | SUBCUTANEOUS | 1 refills | Status: DC

## 2023-03-11 MED ORDER — LISINOPRIL-HYDROCHLOROTHIAZIDE 20-12.5 MG PO TABS: 1.0000 | ORAL_TABLET | Freq: Every day | ORAL | 3 refills | Status: DC

## 2023-03-11 NOTE — Assessment & Plan Note (Signed)
BP remains mildly above goal. I will increase the dose of her Zestoretic.

## 2023-03-11 NOTE — Assessment & Plan Note (Signed)
We discussed resuming Ozempic for management of prediabetes due to risk for progression to diabetes.

## 2023-03-11 NOTE — Telephone Encounter (Signed)
Caller Name: Azure Call back phone #: [phone]  Reason for Call: Pt was calling Dr Veto Kemps to let him know she did call her ins and they will cover the med he suggested. She would like a call back.

## 2023-03-11 NOTE — Telephone Encounter (Signed)
Spoke to patient and Marie Tate will be covered by insurance as long as it is attached to a pre-diabetes or diabetes DX.    Information was given to provider to put into to todays note. Dm/cma

## 2023-03-11 NOTE — Progress Notes (Signed)
[facility] [address] [place] [address] Dept: [phone] Dept Fax: [phone]  Chronic Care Office Visit  Subjective:    Patient ID: Marie Tate, female    DOB: September 30, 1960, 62 y.o..   MRN: [id number]  Chief Complaint  Patient presents with   Medical Management of Chronic Issues    3 month f/u.  C/o having some SOB and pains in LT lower/upper arm off/on 3 weeks.     History of Present Illness:  Patient is in today for reassessment of chronic medical issues.  Ms. Marie Tate has a history of  hypertension. She is managed on Zestoretic 10-12.5 mg daily.    Ms. Marie Tate has a history of obesity. She notes that her highest weight was 232 lbs. She was able to loose some weight on her own, before going on Ozempic for 2 months. She lost down to 198 lbs, but then her insurance stopped covering Ozempic. She has noted some weight returning. She admits to difficulty exercising due to knee arthritis, but she stills tries to do some of this.  Ms. Marie Tate has a history of prediabetes.  Ms. Marie Tate notes several episodes of dyspnea at rest over the past 3 weeks. These occur suddenly, lasting 3-5 minutes. At least one of the episodes was associated with diaphoresis and left forearm pain. She had been seen in the ED in December with forearm pain. This was at a time that her sister was in very poor health (she later died).   Past Medical History: Patient Active Problem List   Diagnosis Date Noted   Essential hypertension 12/03/2022   Hepatic steatosis 12/03/2022   Liver lesion, right lobe 12/03/2022   Class 1 obesity due to excess calories with body mass index (BMI) of 34.0 to 34.9 in adult 12/03/2022   Prediabetes 12/03/2022   Unilateral primary osteoarthritis, right knee 04/02/2017   Bacterial vaginosis 07/09/2016   Epigastric pain 07/09/2016   Dysuria 07/09/2016   Hiatal hernia 07/09/2016   GERD (gastroesophageal reflux  disease) 07/09/2016   Past Surgical History:  Procedure Laterality Date   UMBILICAL HERNIA REPAIR     UTERINE FIBROID SURGERY     x 2   Family History  Problem Relation Age of Onset   Hypertension Mother    Heart attack Mother    Hypertension Father    Stroke Father    Cancer Sister        Stomach(?)   Kidney disease Sister    Diabetes Brother    Seizures Brother    Heart attack Brother    Stomach cancer Maternal Aunt    Tuberculosis Other    Colon cancer Neg Hx    Esophageal cancer Neg Hx    Liver cancer Neg Hx    Pancreatic cancer Neg Hx    Outpatient Medications Prior to Visit  Medication Sig Dispense Refill   BLACK CURRANT SEED OIL PO Take by mouth.     Magnesium 250 MG TABS Take 250 mg by mouth daily.     meloxicam (MOBIC) 15 MG tablet Take 1 tablet (15 mg total) by mouth daily. 30 tablet 1   methocarbamol (ROBAXIN) 500 MG tablet Take 1 tablet (500 mg total) by mouth at bedtime as needed for muscle spasms. 10 tablet 0   VITAMIN D, CHOLECALCIFEROL, PO Take by mouth.     vitamin E 1000 UNIT capsule Take 1,000 Units by mouth daily.     Flaxseed, Linseed, (FLAX SEED OIL) 1000 MG  CAPS Take by mouth.     lisinopril-hydrochlorothiazide (ZESTORETIC) 10-12.5 MG tablet Take 1 tablet by mouth daily. 90 tablet 3   No facility-administered medications prior to visit.   No Known Allergies Objective:   Today's Vitals   03/11/23 1405  BP: 136/80  Pulse: 63  Temp: 98.7 F (37.1 C)  TempSrc: Temporal  SpO2: 100%  Weight: 217 lb 12.8 oz (98.8 kg)  Height: 5\' 6"  (1.676 m)   Body mass index is 35.15 kg/m.   General: Well developed, well nourished. No acute distress. Lungs: Clear to auscultation bilaterally. No wheezing, rales or rhonchi. CV: RRR without murmurs or rubs. Pulses 2+ bilaterally. Psych: Alert and oriented. Normal mood and affect.  Health Maintenance Due  Topic Date Due   HIV Screening  Never done   DTaP/Tdap/Td (1 - Tdap) Never done   Zoster Vaccines-  Shingrix (2 of 2) 08/17/2022     Assessment & Plan:   Problem List Items Addressed This Visit       Cardiovascular and Mediastinum   Essential hypertension - Primary    BP remains mildly above goal. I will increase the dose of her Zestoretic.      Relevant Medications   lisinopril-hydrochlorothiazide (ZESTORETIC) 20-12.5 MG tablet     Other   Prediabetes    We discussed resuming Ozempic for management of prediabetes due to risk for progression to diabetes.      Relevant Medications   Semaglutide,0.25 or 0.5MG /DOS, (OZEMPIC, 0.25 OR 0.5 MG/DOSE,) 2 MG/3ML SOPN   Other Visit Diagnoses     Dyspnea, unspecified type       Etiology is unclear. Reviewed prior EKGs which were normal. May be due to anxiety, but feel we shoudl rule out underlying cardiac causes. Refer to cardiology.   Relevant Orders   Ambulatory referral to Cardiology   Left arm pain       Etiology is unclear. Reviewed prior EKGs which were normal. May be due to anxiety, but feel we shoudl rule out underlying cardiac causes. Refer to cardiology.   Relevant Orders   Ambulatory referral to Cardiology       Return in about 3 months (around 06/11/2023) for Reassessment.   Loyola Mast, MD

## 2023-03-13 NOTE — Telephone Encounter (Signed)
Pt called I read the note to her so she will be waiting for the med.

## 2023-03-13 NOTE — Telephone Encounter (Signed)
Ozempic was sent to the pharmacy by provider.  Dm/cma

## 2023-03-14 MED ORDER — WEGOVY 0.25 MG/0.5ML ~~LOC~~ SOAJ: 0.2500 mg | SUBCUTANEOUS | 0 refills | Status: DC

## 2023-03-14 MED ORDER — WEGOVY 0.5 MG/0.5ML ~~LOC~~ SOAJ: 0.5000 mg | SUBCUTANEOUS | 0 refills | Status: DC

## 2023-03-14 NOTE — Telephone Encounter (Signed)
Patient had stated that her insurance will cover [facility].  Can you send it into the pharmacy for her? Thanks .Dm/cma

## 2023-03-14 NOTE — Addendum Note (Signed)
Addended by: Loyola Mast on: 03/14/2023 01:02 PM   Modules accepted: Orders

## 2023-03-17 ENCOUNTER — Ambulatory Visit: Payer: BC Managed Care – PPO | Attending: Sports Medicine

## 2023-03-17 NOTE — Telephone Encounter (Signed)
Pt is checking on the status of this message. Please advise at [phone]

## 2023-03-17 NOTE — Telephone Encounter (Signed)
Unable to leave VM due to # not in service.   RX was sent to the pharmacy on 03/14/23 to the pharmacy. Dm/cma

## 2023-03-18 NOTE — Telephone Encounter (Signed)
LFT VM that RX was sent to the pharmacy on 03/14/23 to the pharmacy.  Advised her to check with the pharmacy. Dm/cma

## 2023-03-19 NOTE — Telephone Encounter (Signed)
Spoke to pharmacist, was advised that [employer] has a piad claim for the [facility] but will cost $1200.  Was given the invormation for her secondary(Medicaid)  BIN [id number] PCN  [id number] Group  [facility] ID  [id number] R  Tried doing PA through cover my meds and they are unable to find patient.  Will have to try a different way to do the PA. Dm/cma

## 2023-03-21 ENCOUNTER — Other Ambulatory Visit (HOSPITAL_COMMUNITY): Payer: Self-pay

## 2023-03-21 ENCOUNTER — Telehealth: Payer: Self-pay

## 2023-03-21 NOTE — Telephone Encounter (Signed)
Pt and insurance comp checking on status of this message. PA dept phone #[phone] and fax #[phone].

## 2023-03-21 NOTE — Telephone Encounter (Signed)
Pharmacy Patient Advocate Encounter   Received notification that prior authorization for Wegovy 0.25mg /0.62ml is required/requested.   PA submitted to [facility] via CoverMyMeds Key  # [id number] Status is pending

## 2023-03-24 ENCOUNTER — Telehealth: Payer: Self-pay | Admitting: Family Medicine

## 2023-03-24 NOTE — Telephone Encounter (Signed)
Patient notified VIA phone and advised that it can tae 24- 72 hours to get an answer.  Dm/cma

## 2023-03-24 NOTE — Telephone Encounter (Signed)
Patient notified and no other questions. Dm/cma

## 2023-03-24 NOTE — Telephone Encounter (Signed)
[place] [employer] called and said that the prior authorization for wygovy pt has to establish heart disease and not have associated with tumors so that is why the dr put in wygovy( this number is for you only / please do not give the pt this phone # [phone] )  they also said they do not have any information for prior authorization for the ozempic

## 2023-03-24 NOTE — Telephone Encounter (Signed)
Pharmacy Patient Advocate Encounter  Received notification from [employer] ([employer]) that the request for prior authorization for Reginal Lutes has been denied due to per your health plan's criteria, this drug is covered if you meet the following: (1) You have established heart disease (cardiovascular disease defined as having a history of myocardial infarction, stroke or symptomatic peripheral arterial disease). (2) You do not have a condition associated with tumors (multiple endocrine neoplasia syndrome type 2)..    Please be advised we currently do not have a Pharmacist to review denials, therefore you will need to process appeals accordingly as needed. Thanks for your support at this time.   You may call [phone] to appeal.

## 2023-03-27 NOTE — Telephone Encounter (Signed)
Noted.  She isn't on Ozempic.  Dm/cma

## 2023-03-27 NOTE — Telephone Encounter (Signed)
PA was never sent over, [facility] advocate is calling checking on status of this message.

## 2023-04-01 ENCOUNTER — Ambulatory Visit: Payer: Medicaid Other | Admitting: Sports Medicine

## 2023-04-21 NOTE — Therapy (Deleted)
OUTPATIENT PHYSICAL THERAPY LOWER EXTREMITY EVALUATION   Patient Name: Marie Tate MRN: [id number] DOB:12-31-1960, 62 y.o., female Today's Date: 04/21/2023  END OF SESSION:   Past Medical History:  Diagnosis Date   Acid reflux    Fatty liver    Hypertension    Past Surgical History:  Procedure Laterality Date   UMBILICAL HERNIA REPAIR     UTERINE FIBROID SURGERY     x 2   Patient Active Problem List   Diagnosis Date Noted   Essential hypertension 12/03/2022   Hepatic steatosis 12/03/2022   Liver lesion, right lobe 12/03/2022   Class 1 obesity due to excess calories with body mass index (BMI) of 34.0 to 34.9 in adult 12/03/2022   Prediabetes 12/03/2022   Unilateral primary osteoarthritis, right knee 04/02/2017   Bacterial vaginosis 07/09/2016   Epigastric pain 07/09/2016   Dysuria 07/09/2016   Hiatal hernia 07/09/2016   GERD (gastroesophageal reflux disease) 07/09/2016    PCP: Marie Mast, MD   REFERRING PROVIDER: Madelyn Brunner, DO  REFERRING DIAG: M17.0 (ICD-10-CM) - Bilateral primary osteoarthritis of knee  THERAPY DIAG:  No diagnosis found.  Rationale for Evaluation and Treatment: Rehabilitation  ONSET DATE: chronic  SUBJECTIVE:   SUBJECTIVE STATEMENT: ***  PERTINENT HISTORY: HPI: Marie Tate is a very pleasant 62 y.o. female who presents today for acute on chronic left knee pain with known OA.  She does have known arthritis in both knees that have bothered on and off but her left one has been more bothersome recently.   Underwent corticosteroid injection into the left knee on 4/23, states she did not think this helped much.  Her pain does come and go, usually more so over the medial compartment.  Is taking meloxicam 15 mg daily as needed, this does help relieve some of her pain.  Sounds like she may have gotten a post steroid flare from the last injection.  Her right knee pain bothers her at times, she had previously had an injection  months before that did certainly help her pain.  She is working on weight loss activity as able.   Pertinent ROS were reviewed with the patient and found to be negative unless otherwise specified above in HPI.  PAIN:  Are you having pain? {OPRCPAIN:27236}  PRECAUTIONS: None  RED FLAGS: None   WEIGHT BEARING RESTRICTIONS: No  FALLS:  Has patient fallen in last 6 months? No   OCCUPATION: ***  PLOF: Independent  PATIENT GOALS: To manage my knee symptoms  NEXT MD VISIT: PRN  OBJECTIVE:   DIAGNOSTIC FINDINGS: 4 views of the left knee including bilateral AP standing, Rosenberg,  lateral and sunrise view was ordered and reviewed by myself.  X-rays  demonstrate advanced medial joint space narrowing with near bone-on-bone  collapse of the medial joint.  There is varus deformity of the left  greater than right knee.  Lateral joint is preserved, there is mild to  moderate patellofemoral arthralgia as well.   PATIENT SURVEYS:  FOTO ***  MUSCLE LENGTH: Hamstrings: Right *** deg; Left *** deg Marie Tate test: Right *** deg; Left *** deg  POSTURE: {posture:25561}  PALPATION: ***  LOWER EXTREMITY ROM:  {AROM/PROM:27142} ROM Right eval Left eval  Hip flexion    Hip extension    Hip abduction    Hip adduction    Hip internal rotation    Hip external rotation    Knee flexion    Knee extension    Ankle dorsiflexion    Ankle  plantarflexion    Ankle inversion    Ankle eversion     (Blank rows = not tested)  LOWER EXTREMITY MMT:  MMT Right eval Left eval  Hip flexion    Hip extension    Hip abduction    Hip adduction    Hip internal rotation    Hip external rotation    Knee flexion    Knee extension    Ankle dorsiflexion    Ankle plantarflexion    Ankle inversion    Ankle eversion     (Blank rows = not tested)  LOWER EXTREMITY SPECIAL TESTS:  deferred  FUNCTIONAL TESTS:  5 times sit to stand: ***  GAIT: Distance walked: 37ft x2 Assistive device  utilized: {Assistive devices:23999} Level of assistance: {Levels of assistance:24026} Comments: ***   TODAY'S TREATMENT:                                                                                                                              DATE: 04/23/23 Eval and HEP    PATIENT EDUCATION:  Education details: Discussed eval findings, rehab rationale and POC and patient is in agreement  Person educated: Patient Education method: Explanation Education comprehension: verbalized understanding and needs further education  HOME EXERCISE PROGRAM: ***  ASSESSMENT:  CLINICAL IMPRESSION: Patient is a *** y.o. *** who was seen today for physical therapy evaluation and treatment for ***.   OBJECTIVE IMPAIRMENTS: Abnormal gait, decreased activity tolerance, decreased coordination, decreased endurance, decreased mobility, difficulty walking, decreased ROM, decreased strength, obesity, and pain.   ACTIVITY LIMITATIONS: carrying, lifting, standing, squatting, and stairs  PERSONAL FACTORS: Age, Fitness, and Past/current experiences are also affecting patient's functional outcome.   REHAB POTENTIAL: Fair based on chronicity  and advanced OA  CLINICAL DECISION MAKING: Stable/uncomplicated  EVALUATION COMPLEXITY: Low   GOALS: Goals reviewed with patient? {yes/no:20286}  SHORT TERM GOALS: Target date: *** *** Baseline: Goal status: INITIAL  2.  *** Baseline:  Goal status: INITIAL  3.  *** Baseline:  Goal status: INITIAL  4.  *** Baseline:  Goal status: INITIAL  5.  *** Baseline:  Goal status: INITIAL  6.  *** Baseline:  Goal status: INITIAL  LONG TERM GOALS: Target date: ***  *** Baseline:  Goal status: INITIAL  2.  *** Baseline:  Goal status: INITIAL  3.  *** Baseline:  Goal status: INITIAL  4.  *** Baseline:  Goal status: INITIAL  5.  *** Baseline:  Goal status: INITIAL  6.  *** Baseline:  Goal status: INITIAL   PLAN:  PT FREQUENCY: {rehab  frequency:25116}  PT DURATION: {rehab duration:25117}  PLANNED INTERVENTIONS: {rehab planned interventions:25118::"Therapeutic exercises","Therapeutic activity","Neuromuscular re-education","Balance training","Gait training","Patient/Family education","Self Care","Joint mobilization"}  PLAN FOR NEXT SESSION: ***   Marie Tate, PT 04/21/2023, 1:31 PM

## 2023-04-23 ENCOUNTER — Ambulatory Visit: Payer: BC Managed Care – PPO

## 2023-04-24 ENCOUNTER — Ambulatory Visit: Payer: BC Managed Care – PPO | Admitting: Dietician

## 2023-05-06 ENCOUNTER — Encounter: Payer: Self-pay | Admitting: Cardiology

## 2023-05-06 ENCOUNTER — Ambulatory Visit: Payer: BC Managed Care – PPO | Attending: Cardiology | Admitting: Cardiology

## 2023-05-06 NOTE — Progress Notes (Deleted)
Cardiology Office Note:  .   Date:  05/06/2023  ID:  Marie Tate, DOB 07-20-1961, MRN [id number] PCP: Loyola Mast, MD  [facility] Providers Cardiologist:  None { Click to update primary MD,subspecialty MD or APP then REFRESH:1}    No chief complaint on file.   History of Present Illness: .     Marie Tate is an obese 62 y.o. female with a PMH notable for HTN, HLD, pre-DM who presents here for Evaluation of Left Arm Pain and Dyspnea at the request of Loyola Mast, MD.  Anthoney Harada Tate was seen on 03/11/2023 by Dr. Veto Kemps: Noted some weight loss having started on Ozempic down from 232 pounds to a low of 198 pounds, unfortunately Ozempic no longer covered.  She was to gain back some weight.  Noted episodes of resting dyspnea occurring suddenly lasting 3 to 5 minutes.  The swelling was associated with left forearm pain.  Had initially noted the symptoms in December while her sister was sick (eventually died)     Subjective   INTERVAL HISTORY   ROS:  Cardiovascular ROS: {roscv:310661} Review of Systems - {ros master:310782}  Past Medical History:  Diagnosis Date   Acid reflux    Fatty liver    Hypertension    Past Surgical History:  Procedure Laterality Date   UMBILICAL HERNIA REPAIR     UTERINE FIBROID SURGERY     x 2   Social and Family History:  reports that she quit smoking about 14 years ago. Her smoking use included cigarettes. She has never used smokeless tobacco. She reports current alcohol use. She reports that she does not use drugs. family history includes Cancer in her sister; Diabetes in her brother; Heart attack in her brother and mother; Hypertension in her father and mother; Kidney disease in her sister; Seizures in her brother; Stomach cancer in her maternal aunt; Stroke in her father; Tuberculosis in an other family member.      Objective   No outpatient medications have been marked as taking for the 05/06/23 encounter  (Appointment) with Marykay Lex, MD.   Studies Reviewed: .       None  Risk Assessment/Calculations:     No BP recorded.  {Refresh Note OR Click here to enter BP  :1}***        Physical Exam:   VS:  LMP 06/03/2016 (Approximate)    Wt Readings from Last 3 Encounters:  03/11/23 217 lb 12.8 oz (98.8 kg)  01/20/23 213 lb 4.8 oz (96.8 kg)  12/03/22 213 lb (96.6 kg)    GEN: Well nourished, well developed in no acute distress; *** NECK: No JVD; No carotid bruits CARDIAC: Normal S1, S2; RRR, no murmurs, rubs, gallops RESPIRATORY:  Clear to auscultation without rales, wheezing or rhonchi ; nonlabored, good air movement. ABDOMEN: Soft, non-tender, non-distended EXTREMITIES:  No edema; No deformity      ASSESSMENT AND PLAN: .    Problem List Items Addressed This Visit   None       {Are you ordering a CV Procedure (e.g. stress test, cath, DCCV, TEE, etc)?   Press F2        :[id number]}   Dispo: No follow-ups on file.  Total time spent: *** min spent with patient + *** min spent charting = *** min     Signed, Marykay Lex, MD, MS Bryan Lemma, M.D., M.S. Interventional Cardiologist  [facility]  Pager # [phone] Phone # [phone]  [address] [place], [place] [address]

## 2023-05-07 ENCOUNTER — Ambulatory Visit: Payer: BC Managed Care – PPO | Admitting: Physical Therapy

## 2023-05-07 NOTE — Therapy (Signed)
OUTPATIENT PHYSICAL THERAPY LOWER EXTREMITY EVALUATION   Patient Name: Marie Tate MRN: [id number] DOB:[birth year], 62 y.o., female Today's Date: 05/08/2023  END OF SESSION:  PT End of Session - 05/08/23 1557     Visit Number 1    Number of Visits 13    Date for PT Re-Evaluation 06/19/23    Authorization Type BCBS  MCD secondary    PT Start Time 1337    PT Stop Time 1415    PT Time Calculation (min) 38 min    Activity Tolerance Patient tolerated treatment well;Patient limited by pain    Behavior During Therapy [facility] for tasks assessed/performed             Past Medical History:  Diagnosis Date   Acid reflux    Fatty liver    Hypertension    Past Surgical History:  Procedure Laterality Date   UMBILICAL HERNIA REPAIR     UTERINE FIBROID SURGERY     x 2   Patient Active Problem List   Diagnosis Date Noted   Essential hypertension 12/03/2022   Hepatic steatosis 12/03/2022   Liver lesion, right lobe 12/03/2022   Class 1 obesity due to excess calories with body mass index (BMI) of 34.0 to 34.9 in adult 12/03/2022   Prediabetes 12/03/2022   Unilateral primary osteoarthritis, right knee 04/02/2017   Bacterial vaginosis 07/09/2016   Epigastric pain 07/09/2016   Dysuria 07/09/2016   Hiatal hernia 07/09/2016   GERD (gastroesophageal reflux disease) 07/09/2016    PCP: Loyola Mast, MD   REFERRING PROVIDER: Madelyn Brunner, DO  REFERRING DIAG: M17.0 (ICD-10-CM) - Bilateral primary osteoarthritis of knee  THERAPY DIAG:  Chronic pain of left knee - Plan: PT plan of care cert/re-cert  Chronic pain of right knee - Plan: PT plan of care cert/re-cert  Muscle weakness (generalized) - Plan: PT plan of care cert/re-cert  Difficulty in walking, not elsewhere classified - Plan: PT plan of care cert/re-cert  Rationale for Evaluation and Treatment: Rehabilitation  ONSET DATE: chronic  SUBJECTIVE:   SUBJECTIVE STATEMENT: When I sit down too long or getting out of  my car, I get stiff and when I get up in the morning and I feel it in the back of my knee.  I don't  let the pain hold me back  I clean  business In the evening.  I do not do regular exercise.right now or walking. I am having trouble moving around and feel very stiff.   PERTINENT HISTORY: HTN, GERD  , cortisone injection in left knee in April 2023, HBP, pre diabetic    PAIN:  Are you having pain? Yes: NPRS scale: both knees 4/10 at rest  10/10 Pain location: both knees but Left knee is worse and it is in back in the crease Pain description: achy sharp shooting but the stiffness is what is so hard Aggravating factors: stairs ,squats sitting for a long time 30 minutes.  Getting in and out care and bed Relieving factors: sometimes moving  I do take meds occassionally but I do not like to do it.  PRECAUTIONS: None  RED FLAGS: None   WEIGHT BEARING RESTRICTIONS: No  FALLS:  Has patient fallen in last 6 months? No   OCCUPATION: retired but sometimes cleans businesses in evening.  PLOF: Independent  PATIENT GOALS: To manage my knee symptoms  NEXT MD VISIT: PRN  OBJECTIVE:   DIAGNOSTIC FINDINGS: 4 views of the left knee including bilateral AP standing, Rosenberg,  lateral and  sunrise view was ordered and reviewed by myself.  X-rays  demonstrate advanced medial joint space narrowing with near bone-on-bone  collapse of the medial joint.  There is varus deformity of the left  greater than right knee.  Lateral joint is preserved, there is mild to  moderate patellofemoral arthralgia as well.   PATIENT SURVEYS:  FOTO 21 %  48%predicted  MUSCLE LENGTH: Hamstrings: Right 70 deg; Left 70 deg   POSTURE: rounded shoulders, forward head, anterior pelvic tilt, and obesity  PALPATION: Pt with pain in posterior knee over hamstrings.  L > R  LOWER EXTREMITY ROM:  Active ROM Right eval Left eval  Hip flexion 70 80  Hip extension    Hip abduction    Hip adduction    Hip internal  rotation    Hip external rotation    Knee flexion 123/Prom 130 122/P 129 pain  Knee extension 0 0  Ankle dorsiflexion    Ankle plantarflexion    Ankle inversion    Ankle eversion     (Blank rows = not tested)  LOWER EXTREMITY MMT:  MMT Right eval Left eval  Hip flexion 4 4  Hip extension 4- 4-  Hip abduction 3 3  Hip adduction    Hip internal rotation    Hip external rotation    Knee flexion    Knee extension 4 4  Ankle dorsiflexion    Ankle plantarflexion    Ankle inversion    Ankle eversion     (Blank rows = not tested)  LOWER EXTREMITY SPECIAL TESTS:  deferred  FUNCTIONAL TESTS:  5 times sit to stand: 15.44 sec TBD GAIT: Distance walked: 150 ft Assistive device utilized: None Level of assistance: Complete Independence Comments: Pt walks with shortened stride length and decreased push off on Left Step down  Pt with valgus knee Left  on step down  TODAY'S TREATMENT:                                                                                                                              DATE: 04/23/23 Eval and HEP    PATIENT EDUCATION:  Education details: Discussed eval findings, rehab rationale and POC and patient is in agreement  Person educated: Patient Education method: Explanation Education comprehension: verbalized understanding and needs further education  HOME EXERCISE PROGRAM: Access Code: [email or web address] URL: [email or web address] Date: 05/08/2023 Prepared by: Garen Lah  Exercises - Supine Active Straight Leg Raise  - 1 x daily - 7 x weekly - 3 sets - 10 reps - Supine Quad Set  - 1 x daily - 7 x weekly - 3 sets - 10 reps - Clam with Resistance  - 1 x daily - 7 x weekly - 3 sets - 10 reps - Sit to stand with sink support Movement snack  - 1 x daily - 7 x weekly - 3 sets - 10 reps  ASSESSMENT:  CLINICAL IMPRESSION: Patient is a 62 y.o.  female who was seen today for physical therapy evaluation and treatment for bil OA knee  pain.  Pt with difficulty with transfers and transitional movements especially after resting or sitting.  Pt also demonstrates LE weakness and inability to perform heel raises with good form.  Pt will benefit from skilled PT to address impairments and return to more active lifestyle.  .   OBJECTIVE IMPAIRMENTS: Abnormal gait, decreased activity tolerance, decreased coordination, decreased endurance, decreased mobility, difficulty walking, decreased ROM, decreased strength, obesity, and pain.   ACTIVITY LIMITATIONS: carrying, lifting, standing, squatting, and stairs  PERSONAL FACTORS: Age, Fitness, and Past/current experiences are also affecting patient's functional outcome.   REHAB POTENTIAL: Fair based on chronicity  and advanced OA  CLINICAL DECISION MAKING: Stable/uncomplicated  EVALUATION COMPLEXITY: Low   GOALS: Goals reviewed with patient? Yes  SHORT TERM GOALS: Target date: 05-29-23 Pt will be independent with intial HEP Baseline:no knowledge Goal status: INITIAL  2.  Pt will be educated on home walking program Baseline: not currently doing any exercise Goal status: INITIAL  3.  Pt will improve STS to 13  sec and with pain 3/10 or less Baseline: 15.44 and painful wt bearing to RT Goal status: INITIAL    LONG TERM GOALS: Target date: 06-19-23  Pt will be independent with advanced HEP Baseline: no knowledge Goal status: INITIAL  2.  Pt will be able to go negotiate steps with pain 3/10 or less Baseline: on steps 10/10 Goal status: INITIAL  3.  Pt will be able to squat comfortabley with 90/90 hip knee and good form Baseline:  unable to squat greater than 60 degrees Goal status: INITIAL  4.  Pt will be able to clean and perform household chores without exacerbating pain greater than 3/10 Baseline:  Goal status: INITIAL  5.  Pt will improve her L knee flexion to  >/= 120 degrees 2/10 pain for a more functional and efficient gait pattern Baseline: SEe AROM  chart Goal status: INITIAL  6.  FOTO will improve from  21%  to  48%   indicating improved functional mobility  Baseline:  Goal status: INITIAL   PLAN:  PT FREQUENCY: 2x/week  PT DURATION: 6 weeks  PLANNED INTERVENTIONS: Therapeutic exercises, Therapeutic activity, Neuromuscular re-education, Balance training, Gait training, Patient/Family education, Self Care, Joint mobilization, Stair training, Dry Needling, Spinal mobilization, Cryotherapy, Moist heat, Taping, Manual therapy, and Re-evaluation  PLAN FOR NEXT SESSION: Heel raises,  basic LE strengthening.  Taping of knee as needed for pain   Garen Lah, PT, [facility] Certified Exercise Expert for the Aging Adult  05/08/23 4:10 PM Phone: [phone] Fax: [phone]   Check all possible CPT codes: 41324- Therapeutic Exercise, [phone]- Neuro Re-education, [phone] - Gait Training, [phone] - Manual Therapy, 97530 - Therapeutic Activities, [phone] - Self Care, and [phone] - Physical performance training    Check all conditions that are expected to impact treatment: {Conditions expected to impact treatment:Morbid obesity and Musculoskeletal disorders   If treatment provided at initial evaluation, no treatment charged due to lack of authorization.

## 2023-05-08 ENCOUNTER — Ambulatory Visit: Payer: BC Managed Care – PPO | Attending: Sports Medicine | Admitting: Physical Therapy

## 2023-05-08 ENCOUNTER — Other Ambulatory Visit: Payer: Self-pay

## 2023-05-08 DIAGNOSIS — M25561 Pain in right knee: Secondary | ICD-10-CM | POA: Insufficient documentation

## 2023-05-08 DIAGNOSIS — R262 Difficulty in walking, not elsewhere classified: Secondary | ICD-10-CM | POA: Diagnosis present

## 2023-05-08 DIAGNOSIS — G8929 Other chronic pain: Secondary | ICD-10-CM | POA: Diagnosis present

## 2023-05-08 DIAGNOSIS — M25562 Pain in left knee: Secondary | ICD-10-CM | POA: Diagnosis present

## 2023-05-08 DIAGNOSIS — M6281 Muscle weakness (generalized): Secondary | ICD-10-CM | POA: Diagnosis present

## 2023-05-13 ENCOUNTER — Ambulatory Visit: Payer: BC Managed Care – PPO | Admitting: Physical Therapy

## 2023-05-13 ENCOUNTER — Telehealth: Payer: Self-pay | Admitting: Physical Therapy

## 2023-05-13 NOTE — Telephone Encounter (Signed)
Spoke with patient regarding no show. She thought her appointment was later today. Confirmed future appointment times.

## 2023-05-20 ENCOUNTER — Encounter: Payer: Self-pay | Admitting: Physical Therapy

## 2023-05-20 ENCOUNTER — Ambulatory Visit: Payer: BC Managed Care – PPO | Attending: Sports Medicine | Admitting: Physical Therapy

## 2023-05-20 DIAGNOSIS — R262 Difficulty in walking, not elsewhere classified: Secondary | ICD-10-CM | POA: Diagnosis present

## 2023-05-20 DIAGNOSIS — M25562 Pain in left knee: Secondary | ICD-10-CM | POA: Insufficient documentation

## 2023-05-20 DIAGNOSIS — M6281 Muscle weakness (generalized): Secondary | ICD-10-CM | POA: Diagnosis present

## 2023-05-20 DIAGNOSIS — G8929 Other chronic pain: Secondary | ICD-10-CM | POA: Insufficient documentation

## 2023-05-20 DIAGNOSIS — M25561 Pain in right knee: Secondary | ICD-10-CM | POA: Insufficient documentation

## 2023-05-20 NOTE — Patient Instructions (Signed)

## 2023-05-20 NOTE — Therapy (Signed)
OUTPATIENT PHYSICAL THERAPY LOWER EXTREMITY EVALUATION   Patient Name: Marie Tate MRN: [id number] DOB:April 09, 1961, 62 y.o., female Today's Date: 05/20/2023  END OF SESSION:  PT End of Session - 05/20/23 1150     Visit Number 2    Number of Visits 13    Date for PT Re-Evaluation 06/19/23    Authorization Type BCBS  MCD secondary    PT Start Time 1150    PT Stop Time 1235    PT Time Calculation (min) 45 min    Activity Tolerance Patient tolerated treatment well;Patient limited by pain    Behavior During Therapy [facility] for tasks assessed/performed              Past Medical History:  Diagnosis Date   Acid reflux    Fatty liver    Hypertension    Past Surgical History:  Procedure Laterality Date   UMBILICAL HERNIA REPAIR     UTERINE FIBROID SURGERY     x 2   Patient Active Problem List   Diagnosis Date Noted   Essential hypertension 12/03/2022   Hepatic steatosis 12/03/2022   Liver lesion, right lobe 12/03/2022   Class 1 obesity due to excess calories with body mass index (BMI) of 34.0 to 34.9 in adult 12/03/2022   Prediabetes 12/03/2022   Unilateral primary osteoarthritis, right knee 04/02/2017   Bacterial vaginosis 07/09/2016   Epigastric pain 07/09/2016   Dysuria 07/09/2016   Hiatal hernia 07/09/2016   GERD (gastroesophageal reflux disease) 07/09/2016    PCP: Loyola Mast, MD   REFERRING PROVIDER: Madelyn Brunner, DO  REFERRING DIAG: M17.0 (ICD-10-CM) - Bilateral primary osteoarthritis of knee  THERAPY DIAG:  Chronic pain of left knee  Chronic pain of right knee  Muscle weakness (generalized)  Difficulty in walking, not elsewhere classified  Rationale for Evaluation and Treatment: Rehabilitation  ONSET DATE: chronic  SUBJECTIVE:   SUBJECTIVE STATEMENT: My left back of knee is about a 6/10 today.    PERTINENT HISTORY: HTN, GERD  , cortisone injection in left knee in April 2023, HBP, pre diabetic    PAIN:  Are you having pain? Yes:  NPRS scale: both knees 4/10 at rest  10/10 Pain location: both knees but Left knee is worse and it is in back in the crease Pain description: achy sharp shooting but the stiffness is what is so hard Aggravating factors: stairs ,squats sitting for a long time 30 minutes.  Getting in and out care and bed Relieving factors: sometimes moving  I do take meds occassionally but I do not like to do it.  PRECAUTIONS: None  RED FLAGS: None   WEIGHT BEARING RESTRICTIONS: No  FALLS:  Has patient fallen in last 6 months? No   OCCUPATION: retired but sometimes cleans businesses in evening.  PLOF: Independent  PATIENT GOALS: To manage my knee symptoms  NEXT MD VISIT: PRN  OBJECTIVE:   DIAGNOSTIC FINDINGS: 4 views of the left knee including bilateral AP standing, Rosenberg,  lateral and sunrise view was ordered and reviewed by myself.  X-rays  demonstrate advanced medial joint space narrowing with near bone-on-bone  collapse of the medial joint.  There is varus deformity of the left  greater than right knee.  Lateral joint is preserved, there is mild to  moderate patellofemoral arthralgia as well.   PATIENT SURVEYS:  FOTO 21 %  48%predicted  MUSCLE LENGTH: Hamstrings: Right 70 deg; Left 70 deg   POSTURE: rounded shoulders, forward head, anterior pelvic tilt, and obesity  PALPATION: Pt with pain in posterior knee over hamstrings.  L > R  LOWER EXTREMITY ROM:  Active ROM Right eval Left eval  Hip flexion 70 80  Hip extension    Hip abduction    Hip adduction    Hip internal rotation    Hip external rotation    Knee flexion 123/Prom 130 122/P 129 pain  Knee extension 0 0  Ankle dorsiflexion    Ankle plantarflexion    Ankle inversion    Ankle eversion     (Blank rows = not tested)  LOWER EXTREMITY MMT:  MMT Right eval Left eval  Hip flexion 4 4  Hip extension 4- 4-  Hip abduction 3 3  Hip adduction    Hip internal rotation    Hip external rotation    Knee  flexion    Knee extension 4 4  Ankle dorsiflexion    Ankle plantarflexion    Ankle inversion    Ankle eversion     (Blank rows = not tested)  LOWER EXTREMITY SPECIAL TESTS:  deferred  FUNCTIONAL TESTS:  5 times sit to stand: 15.44 sec TBD GAIT: Distance walked: 150 ft Assistive device utilized: None Level of assistance: Complete Independence Comments: Pt walks with shortened stride length and decreased push off on Left Step down  Pt with valgus knee Left  on step down  TODAY'S TREATMENT:   [facility] Adult PT Treatment:                                                DATE: 05-20-23 Therapeutic Exercise: Prone knee curls 3 x 10 with 2.5 lb cuff weights Prone knee extension with straight knee 3x 10 with 2.5 lb cuff weights 2 x 10 Sidelying hip abduction 2 x 10 on R and L Supine Bridge with Mini Swiss Ball Between Knees 3 sets - 10 reps Seated Knee Extension with GTB  3 sets - 10 reps Standing Hamstring Curl with GTB -3 sets - 10 reps Manual Therapy: STW- L hamstring and  IASTYM L hamstring Trigger Point Dry-Needling performed     by Garen Lah Treatment instructions: Expect mild to moderate muscle soreness. S/S of pneumothorax if dry needled over a lung field, and to seek immediate medical attention should they occur. Patient verbalized understanding of these instructions and education.  Patient Consent Given: Yes Education handout provided: Previously provided Muscles treated:   Left lateral medical hamstring Electrical stimulation performed: No Parameters: N/A Treatment response/outcome: twitch response noted, pt noted relief   Modalities.  Moist hot pack to left hamstring                                                                                                                            DATE: 04/23/23 Eval and HEP    PATIENT EDUCATION:  Education details:  Discussed eval findings, rehab rationale and POC and patient is in agreement  Person educated:  Patient Education method: Explanation Education comprehension: verbalized understanding and needs further education  HOME EXERCISE PROGRAM: Access Code: [email or web address] URL: [email or web address] Date: 05/08/2023 Prepared by: Garen Lah  Exercises - Supine Active Straight Leg Raise  - 1 x daily - 7 x weekly - 3 sets - 10 reps - Supine Quad Set  - 1 x daily - 7 x weekly - 3 sets - 10 reps - Clam with Resistance  - 1 x daily - 7 x weekly - 3 sets - 10 reps - Sit to stand with sink support Movement snack  - 1 x daily - 7 x weekly - 3 sets - 10 reps Added 05-20-23 - Supine Bridge with Mini Swiss Ball Between Knees  - 1 x daily - 7 x weekly - 3 sets - 10 reps - Clam with Resistance  - 1 x daily - 7 x weekly - 3 sets - 10 reps - Seated Knee Extension with Resistance  - 1 x daily - 7 x weekly - 3 sets - 10 reps - Standing Hamstring Curl with Resistance  - 1 x daily - 7 x weekly - 3 sets - 10 reps ASSESSMENT:  CLINICAL IMPRESSION:  Pt returns to clinic for 2nd visit and complaint of 6/10 pain in left hamstrings. Pt consents to TPDN for hamstring pain and greatly decreases pain to 2/10. Pt closely monitored throughout session. HEP updated and pt return demo of exercises.  Pt with much improved ability to perform hamstring curls without increasing pain today.  Will continue to progress pt in order to increase ability to participate in regular exercise and join daughters at the gym.    EVAL-Patient is a 62 y.o. female who was seen today for physical therapy evaluation and treatment for bil OA knee pain.  Pt with difficulty with transfers and transitional movements especially after resting or sitting.  Pt also demonstrates LE weakness and inability to perform heel raises with good form.  Pt will benefit from skilled PT to address impairments and return to more active lifestyle.  .   OBJECTIVE IMPAIRMENTS: Abnormal gait, decreased activity tolerance, decreased coordination, decreased  endurance, decreased mobility, difficulty walking, decreased ROM, decreased strength, obesity, and pain.   ACTIVITY LIMITATIONS: carrying, lifting, standing, squatting, and stairs  PERSONAL FACTORS: Age, Fitness, and Past/current experiences are also affecting patient's functional outcome.   REHAB POTENTIAL: Fair based on chronicity  and advanced OA  CLINICAL DECISION MAKING: Stable/uncomplicated  EVALUATION COMPLEXITY: Low   GOALS: Goals reviewed with patient? Yes  SHORT TERM GOALS: Target date: 05-29-23 Pt will be independent with intial HEP Baseline:no knowledge Goal status: ONGOING  2.  Pt will be educated on home walking program Baseline: not currently doing any exercise Goal status: ONGOING  3.  Pt will improve STS to 13  sec and with pain 3/10 or less Baseline: 15.44 and painful wt bearing to RT Goal status: ONGOING    LONG TERM GOALS: Target date: 06-19-23  Pt will be independent with advanced HEP Baseline: no knowledge Goal status: INITIAL  2.  Pt will be able to go negotiate steps with pain 3/10 or less Baseline: on steps 10/10 Goal status: INITIAL  3.  Pt will be able to squat comfortabley with 90/90 hip knee and good form Baseline:  unable to squat greater than 60 degrees Goal status: INITIAL  4.  Pt will be able to clean and perform household  chores without exacerbating pain greater than 3/10 Baseline:  Goal status: INITIAL  5.  Pt will improve her L knee flexion to  >/= 120 degrees 2/10 pain for a more functional and efficient gait pattern Baseline: SEe AROM chart Goal status: INITIAL  6.  FOTO will improve from  21%  to  48%   indicating improved functional mobility  Baseline:  Goal status: INITIAL   PLAN:  PT FREQUENCY: 2x/week  PT DURATION: 6 weeks  PLANNED INTERVENTIONS: Therapeutic exercises, Therapeutic activity, Neuromuscular re-education, Balance training, Gait training, Patient/Family education, Self Care, Joint mobilization, Stair  training, Dry Needling, Spinal mobilization, Cryotherapy, Moist heat, Taping, Manual therapy, and Re-evaluation  PLAN FOR NEXT SESSION: Heel raises,  basic LE strengthening.  Taping of knee as needed for pain   Garen Lah, PT, [facility] Certified Exercise Expert for the Aging Adult  05/20/23 12:51 PM Phone: [phone] Fax: [phone]   Check all possible CPT codes: 03474- Therapeutic Exercise, [phone]- Neuro Re-education, [phone] - Gait Training, [phone] - Manual Therapy, 97530 - Therapeutic Activities, [phone] - Self Care, and [phone] - Physical performance training    Check all conditions that are expected to impact treatment: {Conditions expected to impact treatment:Morbid obesity and Musculoskeletal disorders   If treatment provided at initial evaluation, no treatment charged due to lack of authorization.

## 2023-05-27 ENCOUNTER — Ambulatory Visit: Payer: BC Managed Care – PPO | Admitting: Physical Therapy

## 2023-05-29 ENCOUNTER — Encounter: Payer: Self-pay | Admitting: Physical Therapy

## 2023-05-29 ENCOUNTER — Ambulatory Visit: Payer: BC Managed Care – PPO | Admitting: Physical Therapy

## 2023-05-29 DIAGNOSIS — M25562 Pain in left knee: Secondary | ICD-10-CM | POA: Diagnosis not present

## 2023-05-29 DIAGNOSIS — G8929 Other chronic pain: Secondary | ICD-10-CM

## 2023-05-29 NOTE — Therapy (Signed)
OUTPATIENT PHYSICAL THERAPY LOWER EXTREMITY EVALUATION   Patient Name: Marie Tate MRN: [id number] DOB:24-Jun-1961, 62 y.o., female Today's Date: 05/29/2023  END OF SESSION:  PT End of Session - 05/29/23 1331     Visit Number 3    Number of Visits 13    Date for PT Re-Evaluation 06/19/23    Authorization Type BCBS  MCD secondary    PT Start Time 0130    PT Stop Time 0208    PT Time Calculation (min) 38 min              Past Medical History:  Diagnosis Date   Acid reflux    Fatty liver    Hypertension    Past Surgical History:  Procedure Laterality Date   UMBILICAL HERNIA REPAIR     UTERINE FIBROID SURGERY     x 2   Patient Active Problem List   Diagnosis Date Noted   Essential hypertension 12/03/2022   Hepatic steatosis 12/03/2022   Liver lesion, right lobe 12/03/2022   Class 1 obesity due to excess calories with body mass index (BMI) of 34.0 to 34.9 in adult 12/03/2022   Prediabetes 12/03/2022   Unilateral primary osteoarthritis, right knee 04/02/2017   Bacterial vaginosis 07/09/2016   Epigastric pain 07/09/2016   Dysuria 07/09/2016   Hiatal hernia 07/09/2016   GERD (gastroesophageal reflux disease) 07/09/2016    PCP: Loyola Mast, MD   REFERRING PROVIDER: Madelyn Brunner, DO  REFERRING DIAG: M17.0 (ICD-10-CM) - Bilateral primary osteoarthritis of knee  THERAPY DIAG:  Chronic pain of left knee  Chronic pain of right knee  Rationale for Evaluation and Treatment: Rehabilitation  ONSET DATE: chronic  SUBJECTIVE:   SUBJECTIVE STATEMENT: My left knee is a 4/10. The right knee is okay. The needling really helped a lot.   PERTINENT HISTORY: HTN, GERD  , cortisone injection in left knee in April 2023, HBP, pre diabetic    PAIN:  Are you having pain? Yes: NPRS scale: both knees 4/10 at rest  10/10 Pain location: both knees but Left knee is worse and it is in back in the crease Pain description: achy sharp shooting but the stiffness is what  is so hard Aggravating factors: stairs ,squats sitting for a long time 30 minutes.  Getting in and out care and bed Relieving factors: sometimes moving  I do take meds occassionally but I do not like to do it.  PRECAUTIONS: None  RED FLAGS: None   WEIGHT BEARING RESTRICTIONS: No  FALLS:  Has patient fallen in last 6 months? No   OCCUPATION: retired but sometimes cleans businesses in evening.  PLOF: Independent  PATIENT GOALS: To manage my knee symptoms  NEXT MD VISIT: PRN  OBJECTIVE:   DIAGNOSTIC FINDINGS: 4 views of the left knee including bilateral AP standing, Rosenberg,  lateral and sunrise view was ordered and reviewed by myself.  X-rays  demonstrate advanced medial joint space narrowing with near bone-on-bone  collapse of the medial joint.  There is varus deformity of the left  greater than right knee.  Lateral joint is preserved, there is mild to  moderate patellofemoral arthralgia as well.   PATIENT SURVEYS:  FOTO 21 %  48%predicted  MUSCLE LENGTH: Hamstrings: Right 70 deg; Left 70 deg   POSTURE: rounded shoulders, forward head, anterior pelvic tilt, and obesity  PALPATION: Pt with pain in posterior knee over hamstrings.  L > R  LOWER EXTREMITY ROM:  Active ROM Right eval Left eval Right 05/29/23 Left  05/29/23  Hip flexion 70 80    Hip extension      Hip abduction      Hip adduction      Hip internal rotation      Hip external rotation      Knee flexion 123/Prom 130 122/P 129 pain 125 A P with end range   Knee extension 0 0    Ankle dorsiflexion      Ankle plantarflexion      Ankle inversion      Ankle eversion       (Blank rows = not tested)  LOWER EXTREMITY MMT:  MMT Right eval Left eval  Hip flexion 4 4  Hip extension 4- 4-  Hip abduction 3 3  Hip adduction    Hip internal rotation    Hip external rotation    Knee flexion    Knee extension 4 4  Ankle dorsiflexion    Ankle plantarflexion    Ankle inversion    Ankle eversion      (Blank rows = not tested)  LOWER EXTREMITY SPECIAL TESTS:  deferred  FUNCTIONAL TESTS:  5 times sit to stand: 15.44 sec TBD GAIT: Distance walked: 150 ft Assistive device utilized: None Level of assistance: Complete Independence Comments: Pt walks with shortened stride length and decreased push off on Left Step down  Pt with valgus knee Left  on step down  TODAY'S TREATMENT:  [facility] Adult PT Treatment:                                                DATE: 05/29/23 Therapeutic Exercise: Rec Bike L 2 x 5 min H/S curls 5# 3 x 10  STS 2 x 10 Knee ext 10# 10 x 2 bilat  Qs 5 sec x 10 each  SLR 10 x 2 each  Hip abduction x 10 each Clam green S/L 10 x 2 each  Bridge with ball 10 x 2  Supine hamstring stretch with strap 2 x each    [facility] Adult PT Treatment:                                                DATE: 05-20-23 Therapeutic Exercise: Prone knee curls 3 x 10 with 2.5 lb cuff weights Prone knee extension with straight knee 3x 10 with 2.5 lb cuff weights 2 x 10 Sidelying hip abduction 2 x 10 on R and L Supine Bridge with Mini Swiss Ball Between Knees 3 sets - 10 reps Seated Knee Extension with GTB  3 sets - 10 reps Standing Hamstring Curl with GTB -3 sets - 10 reps Manual Therapy: STW- L hamstring and  IASTYM L hamstring Trigger Point Dry-Needling performed     by Garen Lah Treatment instructions: Expect mild to moderate muscle soreness. S/S of pneumothorax if dry needled over a lung field, and to seek immediate medical attention should they occur. Patient verbalized understanding of these instructions and education.  Patient Consent Given: Yes Education handout provided: Previously provided Muscles treated:   Left lateral medical hamstring Electrical stimulation performed: No Parameters: N/A Treatment response/outcome: twitch response noted, pt noted relief   Modalities.  Moist hot pack to left hamstring  DATE: 04/23/23 Eval and HEP    PATIENT EDUCATION:  Education details: Discussed eval findings, rehab rationale and POC and patient is in agreement  Person educated: Patient Education method: Explanation Education comprehension: verbalized understanding and needs further education  HOME EXERCISE PROGRAM: Access Code: [phone] URL: [email or web address] Date: 05/08/2023 Prepared by: Garen Lah  Exercises - Supine Active Straight Leg Raise  - 1 x daily - 7 x weekly - 3 sets - 10 reps - Supine Quad Set  - 1 x daily - 7 x weekly - 3 sets - 10 reps - Clam with Resistance  - 1 x daily - 7 x weekly - 3 sets - 10 reps - Sit to stand with sink support Movement snack  - 1 x daily - 7 x weekly - 3 sets - 10 reps Added 05-20-23 - Supine Bridge with Mini Swiss Ball Between Knees  - 1 x daily - 7 x weekly - 3 sets - 10 reps - Clam with Resistance  - 1 x daily - 7 x weekly - 3 sets - 10 reps - Seated Knee Extension with Resistance  - 1 x daily - 7 x weekly - 3 sets - 10 reps - Standing Hamstring Curl with Resistance  - 1 x daily - 7 x weekly - 3 sets - 10 reps ASSESSMENT:  CLINICAL IMPRESSION: 4/10 left knee pain on arrival. Pt reports hamstring pain is a lot better since TPDN last visit. She is interested in having TPDN again. Continued with LE strengthening and pt demonstrates improved tolerance to general therex. Improved transfers for STS without pain.  Will continue to progress pt in order to increase ability to participate in regular exercise and join daughters at the gym.    EVAL-Patient is a 62 y.o. female who was seen today for physical therapy evaluation and treatment for bil OA knee pain.  Pt with difficulty with transfers and transitional movements especially after resting or sitting.  Pt also demonstrates LE weakness and inability to perform heel raises with good form.  Pt will benefit from skilled PT to  address impairments and return to more active lifestyle.     OBJECTIVE IMPAIRMENTS: Abnormal gait, decreased activity tolerance, decreased coordination, decreased endurance, decreased mobility, difficulty walking, decreased ROM, decreased strength, obesity, and pain.   ACTIVITY LIMITATIONS: carrying, lifting, standing, squatting, and stairs  PERSONAL FACTORS: Age, Fitness, and Past/current experiences are also affecting patient's functional outcome.   REHAB POTENTIAL: Fair based on chronicity  and advanced OA  CLINICAL DECISION MAKING: Stable/uncomplicated  EVALUATION COMPLEXITY: Low   GOALS: Goals reviewed with patient? Yes  SHORT TERM GOALS: Target date: 05-29-23 Pt will be independent with intial HEP Baseline:no knowledge Goal status: ONGOING  2.  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  Pt will improve STS to 13  sec and with pain 3/10 or less Baseline: 15.44 and painful wt bearing to RT Goal status: ONGOING    LONG TERM GOALS: Target date: 06-19-23  Pt will be independent with advanced HEP Baseline: no knowledge Goal status: INITIAL  2.  Pt will be able to go negotiate steps with pain 3/10 or less Baseline: on steps 10/10 Goal status: INITIAL  3.  Pt will be able to squat comfortabley with 90/90 hip knee and good form Baseline:  unable to squat greater than 60 degrees Goal status: INITIAL  4.  Pt will be able to clean and perform household chores without exacerbating pain greater than 3/10 Baseline:  Goal status: INITIAL  5.  Pt will improve her L knee flexion to  >/= 120 degrees 2/10 pain for a more functional and efficient gait pattern Baseline: SEe AROM chart  Goal status: INITIAL  6.  FOTO will improve from  21%  to  48%   indicating improved functional mobility  Baseline:  Goal status: INITIAL   PLAN:  PT  FREQUENCY: 2x/week  PT DURATION: 6 weeks  PLANNED INTERVENTIONS: Therapeutic exercises, Therapeutic activity, Neuromuscular re-education, Balance training, Gait training, Patient/Family education, Self Care, Joint mobilization, Stair training, Dry Needling, Spinal mobilization, Cryotherapy, Moist heat, Taping, Manual therapy, and Re-evaluation  PLAN FOR NEXT SESSION: Heel raises,  basic LE strengthening.  Taping of knee as needed for pain, TPDN next    Jannette Spanner, PTA 05/29/23 2:09 PM Phone: [phone] Fax: [phone]   Check all possible CPT codes: 29562- Therapeutic Exercise, [phone]- Neuro Re-education, [phone] - Gait Training, [phone] - Manual Therapy, [phone] - Therapeutic Activities, [phone] - Self Care, and [phone] - Physical performance training    Check all conditions that are expected to impact treatment: {Conditions expected to impact treatment:Morbid obesity and Musculoskeletal disorders   If treatment provided at initial evaluation, no treatment charged due to lack of authorization.

## 2023-06-03 ENCOUNTER — Ambulatory Visit: Payer: BC Managed Care – PPO | Admitting: Physical Therapy

## 2023-06-05 ENCOUNTER — Ambulatory Visit: Payer: BC Managed Care – PPO | Admitting: Physical Therapy

## 2023-06-10 ENCOUNTER — Ambulatory Visit: Payer: BC Managed Care – PPO | Admitting: Physical Therapy

## 2023-06-10 ENCOUNTER — Telehealth: Payer: Self-pay | Admitting: Physical Therapy

## 2023-06-10 NOTE — Telephone Encounter (Signed)
Patient called states she thought appt was at 130 (it was at 6) looked to see if there was another appt open for her today for Dry needling, nothing to offer, Patient confirmed for next appt

## 2023-06-12 ENCOUNTER — Ambulatory Visit: Payer: BC Managed Care – PPO | Admitting: Physical Therapy

## 2023-06-19 ENCOUNTER — Ambulatory Visit: Payer: BC Managed Care – PPO | Attending: Sports Medicine | Admitting: Physical Therapy

## 2023-06-19 DIAGNOSIS — M25561 Pain in right knee: Secondary | ICD-10-CM | POA: Insufficient documentation

## 2023-06-19 DIAGNOSIS — R262 Difficulty in walking, not elsewhere classified: Secondary | ICD-10-CM | POA: Diagnosis present

## 2023-06-19 DIAGNOSIS — M6281 Muscle weakness (generalized): Secondary | ICD-10-CM | POA: Diagnosis present

## 2023-06-19 DIAGNOSIS — M25562 Pain in left knee: Secondary | ICD-10-CM | POA: Diagnosis present

## 2023-06-19 DIAGNOSIS — G8929 Other chronic pain: Secondary | ICD-10-CM | POA: Diagnosis present

## 2023-06-19 NOTE — Therapy (Signed)
OUTPATIENT PHYSICAL THERAPY TREATMENT   Patient Name: Marie Tate MRN: [id number] DOB:1961/07/29, 62 y.o., female Today's Date: 06/19/2023  END OF SESSION:  PT End of Session - 06/19/23 1335     Visit Number 4    Number of Visits 13    Date for PT Re-Evaluation 07/31/23    Authorization Type [employer]  MCD secondary    Authorization - Number of Visits 27    PT Start Time 1335    PT Stop Time 1418    PT Time Calculation (min) 43 min    Activity Tolerance Patient tolerated treatment well               Past Medical History:  Diagnosis Date   Acid reflux    Fatty liver    Hypertension    Past Surgical History:  Procedure Laterality Date   UMBILICAL HERNIA REPAIR     UTERINE FIBROID SURGERY     x 2   Patient Active Problem List   Diagnosis Date Noted   Essential hypertension 12/03/2022   Hepatic steatosis 12/03/2022   Liver lesion, right lobe 12/03/2022   Class 1 obesity due to excess calories with body mass index (BMI) of 34.0 to 34.9 in adult 12/03/2022   Prediabetes 12/03/2022   Unilateral primary osteoarthritis, right knee 04/02/2017   Bacterial vaginosis 07/09/2016   Epigastric pain 07/09/2016   Dysuria 07/09/2016   Hiatal hernia 07/09/2016   GERD (gastroesophageal reflux disease) 07/09/2016    PCP: Loyola Mast, MD   REFERRING PROVIDER: Madelyn Brunner, DO  REFERRING DIAG: M17.0 (ICD-10-CM) - Bilateral primary osteoarthritis of knee  THERAPY DIAG:  Chronic pain of left knee  Chronic pain of right knee  Muscle weakness (generalized)  Difficulty in walking, not elsewhere classified  Rationale for Evaluation and Treatment: Rehabilitation  ONSET DATE: chronic  SUBJECTIVE:   SUBJECTIVE STATEMENT: " Pain in the back of the left knee that radiates from the back to the front of the knee but overall is getting much better"  PERTINENT HISTORY: HTN, GERD  , cortisone injection in left knee in April 2023, HBP, pre diabetic    PAIN:  Are you  having pain? Yes: NPRS scale: both knees 4/10  Pain location: both knees but Left knee is worse and it is in back in the crease Pain description: achy sharp shooting but the stiffness is what is so hard Aggravating factors: stairs ,squats sitting for a long time 30 minutes.  Getting in and out care and bed Relieving factors: sometimes moving  I do take meds occassionally but I do not like to do it.  PRECAUTIONS: None  RED FLAGS: None   WEIGHT BEARING RESTRICTIONS: No  FALLS:  Has patient fallen in last 6 months? No   OCCUPATION: retired but sometimes cleans businesses in evening.  PLOF: Independent  PATIENT GOALS: To manage my knee symptoms  NEXT MD VISIT: PRN  OBJECTIVE:   DIAGNOSTIC FINDINGS: 4 views of the left knee including bilateral AP standing, Rosenberg,  lateral and sunrise view was ordered and reviewed by myself.  X-rays  demonstrate advanced medial joint space narrowing with near bone-on-bone  collapse of the medial joint.  There is varus deformity of the left  greater than right knee.  Lateral joint is preserved, there is mild to  moderate patellofemoral arthralgia as well.   PATIENT SURVEYS:  FOTO 21 %  48%predicted  MUSCLE LENGTH: Hamstrings: Right 70 deg; Left 70 deg   POSTURE: rounded shoulders, forward head,  anterior pelvic tilt, and obesity  PALPATION: Pt with pain in posterior knee over hamstrings.  L > R  LOWER EXTREMITY ROM:  Active ROM Right eval Left eval Right 05/29/23 Left 05/29/23 Left 06/19/2023  Hip flexion 70 80     Hip extension       Hip abduction       Hip adduction       Hip internal rotation       Hip external rotation       Knee flexion 123/Prom 130 122/P 129 pain 125 A P with end range  124 P at end range  Knee extension 0 0     Ankle dorsiflexion       Ankle plantarflexion       Ankle inversion       Ankle eversion        (Blank rows = not tested)  LOWER EXTREMITY MMT:  MMT Right eval Left eval  Hip flexion 4  4  Hip extension 4- 4-  Hip abduction 3 3  Hip adduction    Hip internal rotation    Hip external rotation    Knee flexion    Knee extension 4 4  Ankle dorsiflexion    Ankle plantarflexion    Ankle inversion    Ankle eversion     (Blank rows = not tested)  LOWER EXTREMITY SPECIAL TESTS:  deferred  FUNCTIONAL TESTS:  5 times sit to stand: 15.44 sec TBD GAIT: Distance walked: 150 ft Assistive device utilized: None Level of assistance: Complete Independence Comments: Pt walks with shortened stride length and decreased push off on Left Step down  Pt with valgus knee Left  on step down  [facility] Adult PT Treatment:                                                DATE: 06/10/2023 Therapeutic Exercise: Nu-step L6 x 5 min LE only Standing calf stretch 2 x 30 sec knee extended ( 1 x on slant board, 1 x at wall) Standing hip abduction 2 x 10 with RTB bil with HHA from chair. - tactile cues for proper form Standing hamstring curl with RTB 2 x 10 Seated hamstring stretch 1 x 30 sec  Updated HEP for standing hip abduction with theraband, standing calf stretch, seated hamstring stretch Manual Therapy: MTPR along the hamstring using tennis ball  Tack and stretch of the hamstring on the L IASTM along the distal hamstring and lateral calf  Trigger Point Dry-Needling  Treatment instructions: Expect mild to moderate muscle soreness. S/S of pneumothorax if dry needled over a lung field, and to seek immediate medical attention should they occur. Patient verbalized understanding of these instructions and education.  Patient Consent Given: Yes Education handout provided: Previously provided Muscles treated: L distal semi-membranous Electrical stimulation performed: No Parameters: N/A Treatment response/outcome: twitch response noted, pt responded well to treatment   TODAY'S TREATMENT:  [facility] Adult PT Treatment:                                                DATE: 05/29/23 Therapeutic  Exercise: Rec Bike L 2 x 5 min H/S curls 5# 3 x 10  STS 2 x 10  Knee ext 10# 10 x 2 bilat  Qs 5 sec x 10 each  SLR 10 x 2 each  Hip abduction x 10 each Clam green S/L 10 x 2 each  Bridge with ball 10 x 2  Supine hamstring stretch with strap 2 x each    [facility] Adult PT Treatment:                                                DATE: 05-20-23 Therapeutic Exercise: Prone knee curls 3 x 10 with 2.5 lb cuff weights Prone knee extension with straight knee 3x 10 with 2.5 lb cuff weights 2 x 10 Sidelying hip abduction 2 x 10 on R and L Supine Bridge with Mini Swiss Ball Between Knees 3 sets - 10 reps Seated Knee Extension with GTB  3 sets - 10 reps Standing Hamstring Curl with GTB -3 sets - 10 reps Manual Therapy: STW- L hamstring and  IASTYM L hamstring Trigger Point Dry-Needling performed     by Garen Lah Treatment instructions: Expect mild to moderate muscle soreness. S/S of pneumothorax if dry needled over a lung field, and to seek immediate medical attention should they occur. Patient verbalized understanding of these instructions and education.  Patient Consent Given: Yes Education handout provided: Previously provided Muscles treated:   Left lateral medical hamstring Electrical stimulation performed: No Parameters: N/A Treatment response/outcome: twitch response noted, pt noted relief   Modalities.  Moist hot pack to left hamstring                                                                                                                            DATE: 04/23/23 Eval and HEP    PATIENT EDUCATION:  Education details: Discussed eval findings, rehab rationale and POC and patient is in agreement  Person educated: Patient Education method: Explanation Education comprehension: verbalized understanding and needs further education  HOME EXERCISE PROGRAM: Access Code: [phone] URL: [email or web address] Date: 06/19/2023 Prepared by: Lulu Riding  Exercises - Supine Quad Set  - 1 x daily - 7 x weekly - 3 sets - 10 reps - Supine Active Straight Leg Raise  - 1 x daily - 7 x weekly - 3 sets - 10 reps - Supine Bridge with Mini Swiss Ball Between Knees  - 1 x daily - 7 x weekly - 3 sets - 10 reps - Clam with Resistance  - 1 x daily - 7 x weekly - 3 sets - 10 reps - Sit to stand with sink support Movement snack  - 1 x daily - 7 x weekly - 3 sets - 10 reps - Seated Knee Extension with Resistance  - 1 x daily - 7 x weekly - 3 sets - 10 reps - Standing Hamstring Curl with Resistance  - 1 x daily - 7 x weekly -  3 sets - 10 reps - Standing Hip Abduction with Resistance at Ankles and Counter Support  - 1 x daily - 7 x weekly - 2 sets - 10 reps - Seated Hamstring Stretch  - 1-3 x daily - 7 x weekly - 2 sets - 2 reps - 30 hold - Gastroc Stretch with Foot at Wall  - 1-3 x daily - 7 x weekly - 2 sets - 10 reps ASSESSMENT:  CLINICAL IMPRESSION: Patient arrives to session noting she is feeling better and reports the DN last time combined with the exercises has really helped. Time was taken to review techniques for self manual trigger point release, with tennis ball. Continued TPDN for the L hamstring followed with IASTM techniques. Continued working on hip strengthening and stretch of the hamstring/ calf. End of session she noted feeling better than when she came in. She is making good progress with physical therapy and would benefit from continued treatment to address knee pain, improve strength and function by addressing the deficits listed.     EVAL-Patient is a 62 y.o. female who was seen today for physical therapy evaluation and treatment for bil OA knee pain.  Pt with difficulty with transfers and transitional movements especially after resting or sitting.  Pt also demonstrates LE weakness and inability to perform heel raises with good form.  Pt will benefit from skilled PT to address impairments and return to more active lifestyle.      OBJECTIVE IMPAIRMENTS: Abnormal gait, decreased activity tolerance, decreased coordination, decreased endurance, decreased mobility, difficulty walking, decreased ROM, decreased strength, obesity, and pain.   ACTIVITY LIMITATIONS: carrying, lifting, standing, squatting, and stairs  PERSONAL FACTORS: Age, Fitness, and Past/current experiences are also affecting patient's functional outcome.   REHAB POTENTIAL: Fair based on chronicity  and advanced OA  CLINICAL DECISION MAKING: Stable/uncomplicated  EVALUATION COMPLEXITY: Low   GOALS: Goals reviewed with patient? Yes  SHORT TERM GOALS: Target date: 05-29-23 Pt will be independent with intial HEP Baseline:no knowledge Goal status: ONGOING  2.  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  Pt will improve STS to 13  sec and with pain 3/10 or less Baseline: 15.44 and painful wt bearing to RT Goal status: ONGOING    LONG TERM GOALS: Target date: updated 07/31/2023    Pt will be independent with advanced HEP Baseline: no knowledge Goal status: Ongoing 06/19/2023  2.  Pt will be able to go negotiate steps with pain 3/10 or less Baseline: on steps 10/10 Goal status: Progressing 06/19/2023  3.  Pt will be able to squat comfortabley with 90/90 hip knee and good form Baseline:  unable to squat greater than 60 degrees Goal status: progressing 06/19/2023  4.  Pt will be able to clean and perform household chores without exacerbating pain greater than 3/10 Baseline:  Goal status: progressing 06/19/2023  5.  Pt will improve her L knee flexion to  >/= 120 degrees 2/10 pain for a more functional and efficient gait pattern Baseline: SEe AROM chart  Goal status: progressing 06/19/2023  6.  FOTO will improve from  21%  to  48%   indicating improved functional mobility  Baseline:  Goal status: NEW (  not assessed until  6th visit)   PLAN:  PT FREQUENCY: 1-2x/week  PT DURATION: 4 weeks  PLANNED INTERVENTIONS: Therapeutic exercises, Therapeutic activity, Neuromuscular re-education, Balance training,  Gait training, Patient/Family education, Self Care, Joint mobilization, Stair training, Dry Needling, Spinal mobilization, Cryotherapy, Moist heat, Taping, Manual therapy, and Re-evaluation  PLAN FOR NEXT SESSION: Heel raises,  basic LE strengthening.  Taping of knee as needed. Response to TPDN. Continue gross hip/ knee strengthening. ( Scheduling 1 appt at a time per attendance policy)  Lulu Riding PT, DPT, LAT, ATC  06/19/23  2:31 PM

## 2023-06-26 ENCOUNTER — Encounter: Payer: BC Managed Care – PPO | Admitting: Physical Therapy

## 2023-07-01 NOTE — Therapy (Addendum)
OUTPATIENT PHYSICAL THERAPY TREATMENT/DISCHARGE NOTE PHYSICAL THERAPY DISCHARGE SUMMARY  Visits from Start of Care: 5  Current functional level related to goals / functional outcomes: Last known as below   Remaining deficits: As indicated below   Education / Equipment: Initial HEP   Patient agrees to discharge. Patient goals were partially met. Patient is being discharged due to financial reasons.  Pt husband was laid off from work and pt needs to DC due to financial reasons.     Patient Name: Marie Tate MRN: [id number] DOB:03-06-1961, 62 y.o., female Today's Date: 07/02/2023  END OF SESSION:  PT End of Session - 07/02/23 1338     Visit Number 5    Number of Visits 13    Date for PT Re-Evaluation 07/31/23    Authorization Type BCBS  MCD secondary    Authorization - Visit Number 5    Authorization - Number of Visits 27    PT Start Time 1335    PT Stop Time 1415    PT Time Calculation (min) 40 min    Activity Tolerance Patient tolerated treatment well    Behavior During Therapy WFL for tasks assessed/performed                Past Medical History:  Diagnosis Date   Acid reflux    Fatty liver    Hypertension    Past Surgical History:  Procedure Laterality Date   UMBILICAL HERNIA REPAIR     UTERINE FIBROID SURGERY     x 2   Patient Active Problem List   Diagnosis Date Noted   Essential hypertension 12/03/2022   Hepatic steatosis 12/03/2022   Liver lesion, right lobe 12/03/2022   Class 1 obesity due to excess calories with body mass index (BMI) of 34.0 to 34.9 in adult 12/03/2022   Prediabetes 12/03/2022   Unilateral primary osteoarthritis, right knee 04/02/2017   Bacterial vaginosis 07/09/2016   Epigastric pain 07/09/2016   Dysuria 07/09/2016   Hiatal hernia 07/09/2016   GERD (gastroesophageal reflux disease) 07/09/2016    PCP: Loyola Mast, MD   REFERRING PROVIDER: Madelyn Brunner, DO  REFERRING DIAG: M17.0 (ICD-10-CM) - Bilateral  primary osteoarthritis of knee  THERAPY DIAG:  Chronic pain of left knee  Chronic pain of right knee  Muscle weakness (generalized)  Difficulty in walking, not elsewhere classified  Rationale for Evaluation and Treatment: Rehabilitation  ONSET DATE: chronic  SUBJECTIVE:   SUBJECTIVE STATEMENT: Pain in the back of the left knee and sometimes sharp   PERTINENT HISTORY: HTN, GERD  , cortisone injection in left knee in April 2023, HBP, pre diabetic    4/10 07-02-23 PAIN:  Are you having pain? Yes: NPRS scale: both knees 4/10  Pain location: both knees but Left knee is worse and it is in back in the crease Pain description: achy sharp shooting but the stiffness is what is so hard Aggravating factors: stairs ,squats sitting for a long time 30 minutes.  Getting in and out care and bed Relieving factors: sometimes moving  I do take meds occassionally but I do not like to do it.  PRECAUTIONS: None  RED FLAGS: None   WEIGHT BEARING RESTRICTIONS: No  FALLS:  Has patient fallen in last 6 months? No   OCCUPATION: retired but sometimes cleans businesses in evening.  PLOF: Independent  PATIENT GOALS: To manage my knee symptoms  NEXT MD VISIT: PRN  OBJECTIVE:   DIAGNOSTIC FINDINGS: 4 views of the left knee including bilateral AP standing,  Zoila Shutter,  lateral and sunrise view was ordered and reviewed by myself.  X-rays  demonstrate advanced medial joint space narrowing with near bone-on-bone  collapse of the medial joint.  There is varus deformity of the left  greater than right knee.  Lateral joint is preserved, there is mild to  moderate patellofemoral arthralgia as well.   PATIENT SURVEYS:  FOTO 21 %  48%predicted 07-02-23  84% MUSCLE LENGTH: Hamstrings: Right 70 deg; Left 70 deg   POSTURE: rounded shoulders, forward head, anterior pelvic tilt, and obesity  PALPATION: Pt with pain in posterior knee over hamstrings.  L > R  LOWER EXTREMITY ROM:  Active ROM  Right eval Left eval Right 05/29/23 Left 05/29/23 Left 06/19/2023 Left 07-02-23  Hip flexion 70 80      Hip extension        Hip abduction        Hip adduction        Hip internal rotation        Hip external rotation        Knee flexion 123/Prom 130 122/P 129 pain 125 A P with end range  124 P at end range 128 Pat end of range  Knee extension 0 0      Ankle dorsiflexion        Ankle plantarflexion        Ankle inversion        Ankle eversion         (Blank rows = not tested)  LOWER EXTREMITY MMT:  MMT Right eval Left eval Right/Left 07-02-23  Hip flexion 4 4 4+/4+  Hip extension 4- 4- 4/4  Hip abduction 3 3 4/4-  Hip adduction     Hip internal rotation     Hip external rotation     Knee flexion     Knee extension 4 4 4/4+  Ankle dorsiflexion     Ankle plantarflexion     Ankle inversion     Ankle eversion      (Blank rows = not tested)  LOWER EXTREMITY SPECIAL TESTS:  deferred  FUNCTIONAL TESTS:  5 times sit to stand: 15.44 sec 8.91 sec 07-02-23 TBD GAIT: Distance walked: 150 ft Assistive device utilized: None Level of assistance: Complete Independence Comments: Pt walks with shortened stride length and decreased push off on Left Step down  Pt with valgus knee Left  on step down [facility] Adult PT Treatment:                                                DATE: 07-02-23 FOTO 84% Therapeutic Exercise: Nu-step L6 x 5 min LE only Slant board 30 sec x 2 Left and R Standing hip abduction 2 x 10 with GTB bil with HHA from chair. - tactile cues for proper form Standing hamstring curl with GTB 2 x 10 Standing hip flexion with GTB 2 x 10 Seated hamstring stretch 1 x 30 sec with strap Step ups on 6 inch step 3 x 10 reps Up and down steps 4 rounds for endurance with no increase in pain from 410 STS 3 x 10 with 25 # KB   [facility] Adult PT Treatment:  DATE: 06/10/2023 Therapeutic Exercise: Nu-step L6 x 5 min LE  only Standing calf stretch 2 x 30 sec knee extended ( 1 x on slant board, 1 x at wall) Standing hip abduction 2 x 10 with RTB bil with HHA from chair. - tactile cues for proper form Standing hamstring curl with RTB 2 x 10 Seated hamstring stretch 1 x 30 sec  Updated HEP for standing hip abduction with theraband, standing calf stretch, seated hamstring stretch Manual Therapy: MTPR along the hamstring using tennis ball  Tack and stretch of the hamstring on the L IASTM along the distal hamstring and lateral calf  Trigger Point Dry-Needling  Treatment instructions: Expect mild to moderate muscle soreness. S/S of pneumothorax if dry needled over a lung field, and to seek immediate medical attention should they occur. Patient verbalized understanding of these instructions and education.  Patient Consent Given: Yes Education handout provided: Previously provided Muscles treated: L distal semi-membranous Electrical stimulation performed: No Parameters: N/A Treatment response/outcome: twitch response noted, pt responded well to treatment   TODAY'S TREATMENT:  [facility] Adult PT Treatment:                                                DATE: 05/29/23 Therapeutic Exercise: Rec Bike L 2 x 5 min H/S curls 5# 3 x 10  STS 2 x 10 Knee ext 10# 10 x 2 bilat  Qs 5 sec x 10 each  SLR 10 x 2 each  Hip abduction x 10 each Clam green S/L 10 x 2 each  Bridge with ball 10 x 2  Supine hamstring stretch with strap 2 x each    [facility] Adult PT Treatment:                                                DATE: 05-20-23 Therapeutic Exercise: Prone knee curls 3 x 10 with 2.5 lb cuff weights Prone knee extension with straight knee 3x 10 with 2.5 lb cuff weights 2 x 10 Sidelying hip abduction 2 x 10 on R and L Supine Bridge with Mini Swiss Ball Between Knees 3 sets - 10 reps Seated Knee Extension with GTB  3 sets - 10 reps Standing Hamstring Curl with GTB -3 sets - 10 reps Manual Therapy: STW- L hamstring and   IASTYM L hamstring Trigger Point Dry-Needling performed     by Garen Lah Treatment instructions: Expect mild to moderate muscle soreness. S/S of pneumothorax if dry needled over a lung field, and to seek immediate medical attention should they occur. Patient verbalized understanding of these instructions and education.  Patient Consent Given: Yes Education handout provided: Previously provided Muscles treated:   Left lateral medical hamstring Electrical stimulation performed: No Parameters: N/A Treatment response/outcome: twitch response noted, pt noted relief   Modalities.  Moist hot pack to left hamstring  DATE: 04/23/23 Eval and HEP    PATIENT EDUCATION:  Education details: Discussed eval findings, rehab rationale and POC and patient is in agreement  Person educated: Patient Education method: Explanation Education comprehension: verbalized understanding and needs further education  HOME EXERCISE PROGRAM: Access Code: [email or web address] URL: [email or web address] Date: 06/19/2023 Prepared by: Lulu Riding  Exercises - Supine Quad Set  - 1 x daily - 7 x weekly - 3 sets - 10 reps - Supine Active Straight Leg Raise  - 1 x daily - 7 x weekly - 3 sets - 10 reps - Supine Bridge with Mini Swiss Ball Between Knees  - 1 x daily - 7 x weekly - 3 sets - 10 reps - Clam with Resistance  - 1 x daily - 7 x weekly - 3 sets - 10 reps - Sit to stand with sink support Movement snack  - 1 x daily - 7 x weekly - 3 sets - 10 reps - Seated Knee Extension with Resistance  - 1 x daily - 7 x weekly - 3 sets - 10 reps - Standing Hamstring Curl with Resistance  - 1 x daily - 7 x weekly - 3 sets - 10 reps - Standing Hip Abduction with Resistance at Ankles and Counter Support  - 1 x daily - 7 x weekly - 2 sets - 10 reps - Seated Hamstring Stretch  - 1-3 x daily - 7 x weekly  - 2 sets - 2 reps - 30 hold - Gastroc Stretch with Foot at Wall  - 1-3 x daily - 7 x weekly - 2 sets - 10 reps Added 07-02-23 - Goblet Squat with Kettlebell  - 1 x daily - 7 x weekly - 3 sets - 10 reps ASSESSMENT:  CLINICAL IMPRESSION: Patient arrives to session noting she is  with 4/10 pain in back of left knee.  Pt FOTO improved to 84% and seh completed all STG and achieved LTG # 4 and # 6.  Pt  left knee flexion improved to 128 passively.  Pt is trying to reinforce HEP for home use and I expect DC in the next 2 -3 visits to complete LTG.s  Pt is trying to time her walks and was educated on importance of designating time for herself to complete HEP and walking program.  Added Goblet squat with weights to HEP Pt left with all questions answered and all concerns addressed.      EVAL-Patient is a 62 y.o. female who was seen today for physical therapy evaluation and treatment for bil OA knee pain.  Pt with difficulty with transfers and transitional movements especially after resting or sitting.  Pt also demonstrates LE weakness and inability to perform heel raises with good form.  Pt will benefit from skilled PT to address impairments and return to more active lifestyle.     OBJECTIVE IMPAIRMENTS: Abnormal gait, decreased activity tolerance, decreased coordination, decreased endurance, decreased mobility, difficulty walking, decreased ROM, decreased strength, obesity, and pain.   ACTIVITY LIMITATIONS: carrying, lifting, standing, squatting, and stairs  PERSONAL FACTORS: Age, Fitness, and Past/current experiences are also affecting patient's functional outcome.   REHAB POTENTIAL: Fair based on chronicity  and advanced OA  CLINICAL DECISION MAKING: Stable/uncomplicated  EVALUATION COMPLEXITY: Low   GOALS: Goals reviewed with patient? Yes  SHORT TERM GOALS: Target date: 05-29-23 Pt will be independent with intial HEP Baseline:no knowledge Goal status: MET  2.  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  Pt will improve STS to 13  sec and with pain 3/10 or less Baseline: 15.44 and painful wt bearing to RT 07-02-23  8.91 sec Goal status: MET    LONG TERM GOALS: Target date: updated 07/31/2023    Pt will be independent with advanced HEP Baseline: no knowledge Goal status: Ongoing 06/19/2023  2.  Pt will be able to go negotiate steps with pain 3/10 or less Baseline: on steps 10/10 Goal status: Progressing 06/19/2023  3.  Pt will be able to squat comfortabley with 90/90 hip knee and good form Baseline:  unable to squat greater than 60 degrees Goal status: progressing 06/19/2023  4.  Pt will be able to clean and perform household chores without exacerbating pain greater than 3/10 Baseline:  07-02-23 2/10 best when cleaning Goal status: MET  5.  Pt will improve her L knee flexion to  >/= 120 degrees 2/10 pain for a more functional and efficient gait pattern Baseline: SEE AROM chart  Goal status: progressing 07/02/2023 4/10 pain  6.  FOTO will improve from  21%  to  48%   indicating improved functional mobility  Baseline: 21% 07-02-23  84% Goal status:MET   PLAN:  PT FREQUENCY: 1-2x/week  PT DURATION: 4 weeks  PLANNED INTERVENTIONS: Therapeutic exercises, Therapeutic activity, Neuromuscular re-education, Balance training, Gait training, Patient/Family education, Self Care, Joint mobilization, Stair training, Dry Needling, Spinal mobilization, Cryotherapy, Moist heat, Taping, Manual therapy, and Re-evaluation  PLAN FOR NEXT SESSION: Heel raises,  basic LE strengthening.  Taping of knee as needed. Response to TPDN. Continue gross hip/ knee strengthening. ( Scheduling 1 appt at a time per attendance policy)  Garen Lah, PT, ATRIC Certified Exercise Expert  for the Aging Adult  07/02/23 2:19 PM Phone: [phone] Fax: [phone]

## 2023-07-02 ENCOUNTER — Ambulatory Visit: Payer: BC Managed Care – PPO | Admitting: Physical Therapy

## 2023-07-02 DIAGNOSIS — M25562 Pain in left knee: Secondary | ICD-10-CM | POA: Diagnosis not present

## 2023-07-02 DIAGNOSIS — G8929 Other chronic pain: Secondary | ICD-10-CM

## 2023-07-02 DIAGNOSIS — R262 Difficulty in walking, not elsewhere classified: Secondary | ICD-10-CM

## 2023-07-02 DIAGNOSIS — M6281 Muscle weakness (generalized): Secondary | ICD-10-CM

## 2023-07-04 ENCOUNTER — Ambulatory Visit: Payer: BC Managed Care – PPO | Admitting: Family Medicine

## 2023-07-04 ENCOUNTER — Encounter: Payer: Self-pay | Admitting: Family Medicine

## 2023-07-04 VITALS — BP 136/78 | HR 82 | Temp 98.1°F | Ht 66.0 in | Wt 220.8 lb

## 2023-07-04 DIAGNOSIS — R739 Hyperglycemia, unspecified: Secondary | ICD-10-CM | POA: Insufficient documentation

## 2023-07-04 DIAGNOSIS — Z23 Encounter for immunization: Secondary | ICD-10-CM | POA: Diagnosis not present

## 2023-07-04 NOTE — Progress Notes (Signed)
[facility] [address] [place] [address] Dept: [phone] Dept Fax: [phone]  Office Visit  Subjective:    Patient ID: Marie Tate, female    DOB: 08-07-1961, 62 y.o..   MRN: [id number]  Chief Complaint  Patient presents with   Blood Sugar Problem    C/o having elevated BS  202, 176, 160,136, 102   History of Present Illness:  Patient is in today concerned about elevated home blood sugars. Ms. Marie Tate notes her husband has a diabetes. She had been feeling more headaches recently, so checked her sugars on his monitor. Over the past week, she ahs had sugars ranging from 102-202. She admits that she has had some visual blurring and is urinating three times a night. She drinks water regularly. She has not had any weight loss. She has a past history of prediabetes.  Past Medical History: Patient Active Problem List   Diagnosis Date Noted   Essential hypertension 12/03/2022   Hepatic steatosis 12/03/2022   Liver lesion, right lobe 12/03/2022   Class 1 obesity due to excess calories with body mass index (BMI) of 34.0 to 34.9 in adult 12/03/2022   Prediabetes 12/03/2022   Unilateral primary osteoarthritis, right knee 04/02/2017   Bacterial vaginosis 07/09/2016   Epigastric pain 07/09/2016   Dysuria 07/09/2016   Hiatal hernia 07/09/2016   GERD (gastroesophageal reflux disease) 07/09/2016   Past Surgical History:  Procedure Laterality Date   EYE SURGERY Right    cataract   UMBILICAL HERNIA REPAIR     UTERINE FIBROID SURGERY     x 2   Family History  Problem Relation Age of Onset   Hypertension Mother    Heart attack Mother    Hypertension Father    Stroke Father    Cancer Sister        Stomach(?)   Kidney disease Sister    Diabetes Brother    Seizures Brother    Heart attack Brother    Stomach cancer Maternal Aunt    Tuberculosis Other    Colon cancer Neg Hx    Esophageal cancer Neg Hx    Liver  cancer Neg Hx    Pancreatic cancer Neg Hx    Outpatient Medications Prior to Visit  Medication Sig Dispense Refill   lisinopril-hydrochlorothiazide (ZESTORETIC) 20-12.5 MG tablet Take 1 tablet by mouth daily. 90 tablet 3   Magnesium 250 MG TABS Take 250 mg by mouth daily.     meloxicam (MOBIC) 15 MG tablet Take 1 tablet (15 mg total) by mouth daily. 30 tablet 1   methocarbamol (ROBAXIN) 500 MG tablet Take 1 tablet (500 mg total) by mouth at bedtime as needed for muscle spasms. 10 tablet 0   VITAMIN D, CHOLECALCIFEROL, PO Take by mouth.     vitamin E 1000 UNIT capsule Take 1,000 Units by mouth daily.     BLACK CURRANT SEED OIL PO Take by mouth. (Patient not taking: Reported on 07/04/2023)     Semaglutide-Weight Management (WEGOVY) 0.25 MG/0.5ML SOAJ Inject 0.25 mg into the skin once a week. (Patient not taking: Reported on 05/08/2023) 2 mL 0   Semaglutide-Weight Management (WEGOVY) 0.5 MG/0.5ML SOAJ Inject 0.5 mg into the skin once a week. Start after 28 days on the 0.25 mg weekly dose. (Patient not taking: Reported on 05/08/2023) 2 mL 0   No facility-administered medications prior to visit.   No Known Allergies   Objective:   Today's Vitals   07/04/23 1420  BP: 136/78  Pulse: 82  Temp: 98.1 F (36.7 C)  TempSrc: Temporal  SpO2: 97%  Weight: 220 lb 12.8 oz (100.2 kg)  Height: 5\' 6"  (1.676 m)   Body mass index is 35.64 kg/m.   General: Well developed, well nourished. No acute distress. Psych: Alert and oriented. Normal mood and affect.  Health Maintenance Due  Topic Date Due   HIV Screening  Never done   DTaP/Tdap/Td (1 - Tdap) Never done   Cervical Cancer Screening (HPV/Pap Cotest)  09/20/2022   INFLUENZA VACCINE  04/17/2023   Lab Results    Latest Ref Rng & Units 09/06/2022    8:30 AM 06/30/2022    5:05 PM 12/21/2021    2:26 PM  CMP  Glucose 70 - 99 mg/dL 161  096  93   BUN 6 - 20 mg/dL 12  14  19    Creatinine 0.44 - 1.00 mg/dL 0.45  4.09  8.11   Sodium 135 - 145  mmol/L 141  140  137   Potassium 3.5 - 5.1 mmol/L 3.6  4.2  3.3   Chloride 98 - 111 mmol/L 107  105  103   CO2 22 - 32 mmol/L 27  28  25    Calcium 8.9 - 10.3 mg/dL 9.2  9.5  9.4   Total Protein 6.5 - 8.1 g/dL 7.0   8.0   Total Bilirubin 0.3 - 1.2 mg/dL 0.6   0.6   Alkaline Phos 38 - 126 U/L 77   84   AST 15 - 41 U/L 16   20   ALT 0 - 44 U/L 16   21     Last hemoglobin A1c Lab Results  Component Value Date   HGBA1C 6.2 12/03/2022      Assessment & Plan:   Problem List Items Addressed This Visit       Other   Hyperglycemia - Primary    Home glucoses are concerning for Type 2 diabetes. I will order an A1c and glucose. If this confirms diabetes, I recommend we start her on metformin. Ms. Marie Tate was on Wegovy last year for weight loss. She would be interested in going back on injections if she could, as she had some good weight loss with these.      Relevant Orders   Hemoglobin A1c   Basic metabolic panel   Other Visit Diagnoses     Need for immunization against influenza       Relevant Orders   Flu vaccine trivalent PF, 6mos and older(Flulaval,Afluria,Fluarix,Fluzone) (Completed)       Return in about 4 weeks (around 08/01/2023) for Reassessment.   Loyola Mast, MD

## 2023-07-04 NOTE — Assessment & Plan Note (Signed)
Home glucoses are concerning for Type 2 diabetes. I will order an A1c and glucose. If this confirms diabetes, I recommend we start her on metformin. Ms. Marie Tate was on Wegovy last year for weight loss. She would be interested in going back on injections if she could, as she had some good weight loss with these.

## 2023-07-05 LAB — BASIC METABOLIC PANEL
BUN: 11 mg/dL (ref 7–25)
CO2: 26 mmol/L (ref 20–32)
Calcium: 9.9 mg/dL (ref 8.6–10.4)
Chloride: 102 mmol/L (ref 98–110)
Creat: 0.63 mg/dL (ref 0.50–1.05)
Glucose, Bld: 96 mg/dL (ref 65–99)
Potassium: 4.5 mmol/L (ref 3.5–5.3)
Sodium: 139 mmol/L (ref 135–146)

## 2023-07-05 LAB — HEMOGLOBIN A1C
Hgb A1c MFr Bld: 6.6 %{Hb} — ABNORMAL HIGH (ref <5.7)
Mean Plasma Glucose: 143 mg/dL
eAG (mmol/L): 7.9 mmol/L

## 2023-07-15 ENCOUNTER — Encounter: Payer: Self-pay | Admitting: Family Medicine

## 2023-07-15 ENCOUNTER — Ambulatory Visit (INDEPENDENT_AMBULATORY_CARE_PROVIDER_SITE_OTHER): Payer: BC Managed Care – PPO | Admitting: Family Medicine

## 2023-07-15 VITALS — BP 138/82 | HR 66 | Temp 97.9°F | Ht 66.0 in | Wt 221.6 lb

## 2023-07-15 DIAGNOSIS — L509 Urticaria, unspecified: Secondary | ICD-10-CM | POA: Diagnosis not present

## 2023-07-15 DIAGNOSIS — R739 Hyperglycemia, unspecified: Secondary | ICD-10-CM | POA: Diagnosis not present

## 2023-07-15 MED ORDER — CETIRIZINE HCL 10 MG PO TABS: 10.0000 mg | ORAL_TABLET | Freq: Every day | ORAL | 11 refills | Status: DC

## 2023-07-15 NOTE — Progress Notes (Signed)
[facility] [address] [place] [address] Dept: [phone] Dept Fax: [phone]  Office Visit  Subjective:    Patient ID: Marie Tate, female    DOB: 16-Dec-1960, 62 y.o..   MRN: [id number]  Chief Complaint  Patient presents with   Rash    C/o having a rash on her neck and on abdomen x 1 days.      History of Present Illness:  Patient is in today with a complaint of a rash on her neck and abdomen since last night. She notes she was leaving work when this developed. She has had both itching and a burning sensation at the site of raised lesions on the right neck and a few on the mid abdomen. She notes she has had issues at times of breakign out around her wrist area a few times a week. This rash is also itchy when it occurs.  Past Medical History: Patient Active Problem List   Diagnosis Date Noted   Hyperglycemia 07/04/2023   Essential hypertension 12/03/2022   Hepatic steatosis 12/03/2022   Liver lesion, right lobe 12/03/2022   Class 1 obesity due to excess calories with body mass index (BMI) of 34.0 to 34.9 in adult 12/03/2022   Prediabetes 12/03/2022   Unilateral primary osteoarthritis, right knee 04/02/2017   Bacterial vaginosis 07/09/2016   Epigastric pain 07/09/2016   Dysuria 07/09/2016   Hiatal hernia 07/09/2016   GERD (gastroesophageal reflux disease) 07/09/2016   Past Surgical History:  Procedure Laterality Date   EYE SURGERY Right    cataract   UMBILICAL HERNIA REPAIR     UTERINE FIBROID SURGERY     x 2   Family History  Problem Relation Age of Onset   Hypertension Mother    Heart attack Mother    Hypertension Father    Stroke Father    Cancer Sister        Stomach(?)   Kidney disease Sister    Diabetes Brother    Seizures Brother    Heart attack Brother    Stomach cancer Maternal Aunt    Tuberculosis Other    Colon cancer Neg Hx    Esophageal cancer Neg Hx    Liver cancer Neg Hx     Pancreatic cancer Neg Hx    Outpatient Medications Prior to Visit  Medication Sig Dispense Refill   buPROPion (WELLBUTRIN XL) 150 MG 24 hr tablet Take 150 mg by mouth daily.     lisinopril-hydrochlorothiazide (ZESTORETIC) 20-12.5 MG tablet Take 1 tablet by mouth daily. 90 tablet 3   Magnesium 250 MG TABS Take 250 mg by mouth daily.     meloxicam (MOBIC) 15 MG tablet Take 1 tablet (15 mg total) by mouth daily. 30 tablet 1   methocarbamol (ROBAXIN) 500 MG tablet Take 1 tablet (500 mg total) by mouth at bedtime as needed for muscle spasms. 10 tablet 0   VITAMIN D, CHOLECALCIFEROL, PO Take by mouth.     vitamin E 1000 UNIT capsule Take 1,000 Units by mouth daily.     BLACK CURRANT SEED OIL PO Take by mouth. (Patient not taking: Reported on 07/04/2023)     No facility-administered medications prior to visit.   No Known Allergies   Objective:   Today's Vitals   07/15/23 0959  BP: 138/82  Pulse: 66  Temp: 97.9 F (36.6 C)  TempSrc: Temporal  SpO2: 97%  Weight: 221 lb 9.6 oz (100.5 kg)  Height: 5\' 6"  (1.676 m)  Body mass index is 35.77 kg/m.   General: Well developed, well nourished. No acute distress. Skin: Warm and dry. There are several raised, red, firm ovate lesions on the right neck. These are oriented horizontally   and measure 2 x 0.5 cm. There is a small (1 mm) vesicle in the center of these lesions. There is a patch of redness on   the upper right chest wall. There are two ovate, red thickened areas ont he abdomen, measuring 1 x o.5 cm ont he  mid abdomen. Psych: Alert and oriented. Normal mood and affect.  Health Maintenance Due  Topic Date Due   HIV Screening  Never done   DTaP/Tdap/Td (1 - Tdap) Never done   Cervical Cancer Screening (HPV/Pap Cotest)  09/20/2022   Lab Results    Latest Ref Rng & Units 07/04/2023    3:01 PM 09/06/2022    8:30 AM 06/30/2022    5:05 PM  CMP  Glucose 65 - 99 mg/dL 96  191  478   BUN 7 - 25 mg/dL 11  12  14    Creatinine 0.50  - 1.05 mg/dL 2.95  6.21  3.08   Sodium 135 - 146 mmol/L 139  141  140   Potassium 3.5 - 5.3 mmol/L 4.5  3.6  4.2   Chloride 98 - 110 mmol/L 102  107  105   CO2 20 - 32 mmol/L 26  27  28    Calcium 8.6 - 10.4 mg/dL 9.9  9.2  9.5   Total Protein 6.5 - 8.1 g/dL  7.0    Total Bilirubin 0.3 - 1.2 mg/dL  0.6    Alkaline Phos 38 - 126 U/L  77    AST 15 - 41 U/L  16    ALT 0 - 44 U/L  16     Last hemoglobin A1c Lab Results  Component Value Date   HGBA1C 6.6 (H) 07/04/2023      Assessment & Plan:   Problem List Items Addressed This Visit       Musculoskeletal and Integument   Urticaria - Primary    The rash appears urticarial. By history, these appear to have more of a physical trigger for when she gets hot, rather than other allergic triggers. I recommend we start her on a daily long-acting antihistamine (Zyrtec). She should also use a short-acting antihistamine (Benadryl) over the next 1-2 days to resolve the current outbreak along with cool compresses.      Relevant Medications   cetirizine (ZYRTEC) 10 MG tablet     Other   Hyperglycemia    Marie Tate had recent testing that is equivocal for diabetes (normal fasting glucose, but A1c > 6.4%). I will plan to see her back in 1 month to retest. She notes she and her sister plan to joint the [employer] and start working out.       Return in about 4 weeks (around 08/12/2023) for Reassessment.   Loyola Mast, MD

## 2023-07-15 NOTE — Patient Instructions (Signed)
Hives Hives are itchy, red, swollen areas on your skin. They can show up on any part of your body. They often go away within 24 hours (acute hives). If you get new hives after the old ones fade and this goes on for many days or weeks, it is called chronic hives. Hives do not spread from person to person (are not contagious). Hives can happen when your body reacts to something that you are allergic to (allergen). These are sometimes called triggers. You can get hives right after being around a trigger, or hours later. What are the causes? Food allergies. Insect bites or stings. Allergies to pollen or pets. Spending time in sunlight, heat, or cold. Exercise. Stress. Other causes, such as: Viruses. This includes the common cold. Infections caused by germs (bacteria). Some medicines. Chemicals or latex. Allergy shots. Blood transfusions. In some cases, the cause is not known. What increases the risk? Being female. Being allergic to foods, such as: Citrus fruits. Milk. Eggs. Peanuts. Tree nuts. Shellfish. Being allergic to: Medicines. Latex. Insects. Animals. Pollen. What are the signs or symptoms?  Itchy, red or white bumps or spots on your skin. These areas may: Swell and get bigger. Change in shape and location. Stand alone or connect to each other over a large area of skin. Sting or hurt. Turn white when pressed in the center (blanch). In very bad cases, your hands, feet, and face may also swell. This may happen if hives start deeper in your skin. How is this treated? Treatment for hives depends on your symptoms. You may need to: Use cool, wet cloths (cool compresses) or take cool showers to stop the itching. Take or apply medicines to: Help with itching (antihistamines). Lessen swelling (corticosteroids). Treat infection (antibiotics). Have a medicine called omalizumab given to you as a shot. You may need this if your hives do not get better with other treatments. In  very bad cases, you may need to use a device filled with medicine that gives an emergency shot of epinephrine (auto-injector pen) to stop a very bad allergic reaction (anaphylactic reaction). Follow these instructions at home: Medicines Take or apply over-the-counter and prescription medicines only as told by your doctor. If you were prescribed antibiotics, use them as told by your doctor. Do not stop using them even if you start to feel better. Skin care Put cool, wet cloths on the hives. Do not scratch your skin. Do not rub your skin. General instructions Do not take hot showers or baths. This can make itching worse. Do not wear tight clothes. Use sunscreen. Wear clothes that cover your skin when you are outside. Avoid triggers that cause your hives. Keep a journal to help track what causes your hives. Write down: What medicines you take. What you eat and drink. What you put on your skin. Keep all follow-up visits. Your doctor will need to make sure treatment is working. Contact a doctor if: Your symptoms do not get better with medicine. Your joints hurt or swell. You have a fever. You have pain in your belly (abdomen). Get help right away if: Your tongue or lips swell. Your eyelids are swollen. Your chest or throat feels tight. You have trouble breathing or swallowing. These symptoms may be an emergency. Get help right away. Call 911. Do not wait to see if the symptoms will go away. Do not drive yourself to the hospital. This information is not intended to replace advice given to you by your health care provider. Make sure   you discuss any questions you have with your health care provider. Document Revised: 05/21/2022 Document Reviewed: 05/21/2022 Elsevier Patient Education  2024 Elsevier Inc.  

## 2023-07-15 NOTE — Assessment & Plan Note (Signed)
Ms. Marie Tate had recent testing that is equivocal for diabetes (normal fasting glucose, but A1c > 6.4%). I will plan to see her back in 1 month to retest. She notes she and her sister plan to joint the [employer] and start working out.

## 2023-07-15 NOTE — Assessment & Plan Note (Signed)
The rash appears urticarial. By history, these appear to have more of a physical trigger for when she gets hot, rather than other allergic triggers. I recommend we start her on a daily long-acting antihistamine (Zyrtec). She should also use a short-acting antihistamine (Benadryl) over the next 1-2 days to resolve the current outbreak along with cool compresses.

## 2023-07-21 ENCOUNTER — Encounter: Payer: Self-pay | Admitting: Cardiology

## 2023-07-21 ENCOUNTER — Ambulatory Visit: Payer: BC Managed Care – PPO

## 2023-07-21 ENCOUNTER — Other Ambulatory Visit: Payer: Self-pay | Admitting: Cardiology

## 2023-07-21 ENCOUNTER — Ambulatory Visit (INDEPENDENT_AMBULATORY_CARE_PROVIDER_SITE_OTHER): Payer: BC Managed Care – PPO

## 2023-07-21 ENCOUNTER — Ambulatory Visit: Payer: BC Managed Care – PPO | Attending: Cardiology | Admitting: Cardiology

## 2023-07-21 VITALS — BP 132/88 | HR 76 | Ht 66.0 in | Wt 220.6 lb

## 2023-07-21 DIAGNOSIS — R0600 Dyspnea, unspecified: Secondary | ICD-10-CM

## 2023-07-21 DIAGNOSIS — E66811 Obesity, class 1: Secondary | ICD-10-CM

## 2023-07-21 DIAGNOSIS — R7303 Prediabetes: Secondary | ICD-10-CM

## 2023-07-21 DIAGNOSIS — E6609 Other obesity due to excess calories: Secondary | ICD-10-CM

## 2023-07-21 DIAGNOSIS — Z8249 Family history of ischemic heart disease and other diseases of the circulatory system: Secondary | ICD-10-CM

## 2023-07-21 DIAGNOSIS — I10 Essential (primary) hypertension: Secondary | ICD-10-CM | POA: Diagnosis not present

## 2023-07-21 DIAGNOSIS — R002 Palpitations: Secondary | ICD-10-CM

## 2023-07-21 DIAGNOSIS — E785 Hyperlipidemia, unspecified: Secondary | ICD-10-CM | POA: Diagnosis not present

## 2023-07-21 DIAGNOSIS — Z6834 Body mass index (BMI) 34.0-34.9, adult: Secondary | ICD-10-CM

## 2023-07-21 NOTE — Progress Notes (Unsigned)
Cardiology Office Note:  .   Date:  07/22/2023  ID:  Marie Tate, DOB 17-Apr-1961, MRN [id number] PCP: Loyola Mast, MD  [facility] Providers Cardiologist:  Bryan Lemma, MD     Chief Complaint  Patient presents with   Shortness of Breath    Pt says comes and goes. She can be during a physical activity or nothing at all.    New Patient (Initial Visit)   Patient Profile: .     Marie Tate is an obese 62 y.o. female with a PMH notable for HTN, pre-DM, borderline HLD who presents here for EVALUATION of SHORTNESS OF BREATH At the request of Loyola Mast, MD.    Marie Tate was referred for evaluation by Dr. Veto Kemps after being seen back in June 2024.  She noted episodes of dyspnea at rest over several weeks occurring suddenly lasting 35 minutes.  1 episode noted to occur but associated with diaphoresis and left forearm pain (this episode occurred when she was upset about her sister who is a very poor health and subsequently died).  Based on her mother, father and brother having cardiovascular disease (mother and brother with MI/CAD, father with stroke), she is referred for cardiology evaluation to exclude ischemic etiology.  Subjective  Discussed the use of AI scribe software for clinical note transcription with the patient, who gave verbal consent to proceed.  History of Present Illness   The patient, with a history of hypertension, presents with intermittent episodes of shortness of breath and tachycardia. These episodes occur both at rest and with exertion, and can happen at any time, even while laying down. The patient describes the sensation as her heart "beating pretty fast," and these episodes are often accompanied by a feeling of breathlessness. The frequency of these episodes is sporadic, with the patient reporting no incidents for a couple of weeks, followed by a sudden onset.  The patient also reports occasional dizziness when these episodes first  started, but this symptom has since resolved. She denies any chest pain or discomfort during routine physical exertion or while walking upstairs. However, she does report occasional shortness of breath when laying down at night and has woken up in the middle of the night due to difficulty breathing, although these incidents are not routine.  The patient also notes some swelling in her ankle and pain in her left shin, but denies any associated dizziness or fainting spells. She is not currently on any medication for diabetes or cholesterol, but is taking Zestoretic for hypertension and Wellbutrin for anxiety, although the latter is not taken daily. She also takes Mobic and Robaxin for joint pain and leg cramping associated with arthritis in her knee, but these are not taken regularly.  The patient has a history of anxiety attacks, which began after the death of her mother in 75. She reports that these attacks are rare and occur approximately once every week or two. The patient is concerned that her current symptoms may be related to anxiety. She also mentions a previous comment from a doctor about a possible heart murmur, but this has not been definitively diagnosed.      ROS:  Review of Systems - Negative except as noted above    Objective  Social History   Tobacco Use   Smoking status: Former    Current packs/day: 0.00    Types: Cigarettes    Quit date: 2010    Years since quitting: 14.8   Smokeless tobacco:  Never  Vaping Use   Vaping status: Never Used  Substance Use Topics   Alcohol use: Yes    Comment: occ   Drug use: No   Family History family history includes Cancer in her sister; Diabetes in her brother; Heart attack in her brother and mother; Hypertension in her father and mother; Kidney disease in her sister; Seizures in her brother; Stomach cancer in her maternal aunt; Stroke in her father; Tuberculosis in an other family member.  Medications - Zestoretic 20-12.5 mg  daily - Wellbutrin XL 150 mg daily - Meloxicam 150 mg daily - Robaxin 500 mg nightly as needed -Zyrtec 10 mg daily  Studies Reviewed: Marland Kitchen   EKG Interpretation Date/Time:  Monday July 21 2023 10:09:03 EST Ventricular Rate:  76 PR Interval:  174 QRS Duration:  80 QT Interval:  386 QTC Calculation: 434 R Axis:   45  Text Interpretation: Normal sinus rhythm Normal ECG When compared with ECG of 06-Sep-2022 08:54, PREVIOUS ECG IS PRESENT Confirmed by Bryan Lemma ([id number]) on 07/21/2023 10:19:17 AM    LABS A1c: 6.6% (07/04/2023) Total cholesterol: 198 mg/dL (33/29/5188) HDL: 63 mg/dL (41/66/0630) LDL: 160 mg/dL (10/93/2355) Triglycerides: 84 mg/dL (73/22/0254) Creatinine: 0.63 mg/dL (27/02/2375) Potassium: 4.5 mmol/L (07/04/2023)  Risk Assessment/Calculations:            Physical Exam:   VS:  BP 132/88 (BP Location: Left Arm, Patient Position: Sitting, Cuff Size: Large)   Pulse 76   Ht 5\' 6"  (1.676 m)   Wt 220 lb 9.6 oz (100.1 kg)   LMP 06/03/2016 (Approximate)   SpO2 98%   BMI 35.61 kg/m    Wt Readings from Last 3 Encounters:  07/21/23 220 lb 9.6 oz (100.1 kg)  07/15/23 221 lb 9.6 oz (100.5 kg)  07/04/23 220 lb 12.8 oz (100.2 kg)    GEN: Well nourished, well developed in no acute distress; Obese, well groomed.  NECK: No JVD; No carotid bruits CARDIAC: RRR, Normal S1, S2; no murmurs, rubs, gallops RESPIRATORY:  Clear to auscultation without rales, wheezing or rhonchi ; nonlabored, good air movement. ABDOMEN: Soft, non-tender, non-distended EXTREMITIES:  No edema; No deformity     ASSESSMENT AND PLAN: .    Problem List Items Addressed This Visit       Cardiology Problems   Essential hypertension (Chronic)    Well-controlled BP on current dose of Zestoretic 20-12.5 mg daily      Hyperlipidemia with target LDL less than 100 (Chronic)    LDL slightly elevated at 118, potentially requiring medication if unable to be controlled with dietary changes. -Discuss  potential need for cholesterol-lowering medication at next visit.        Other   Class 1 obesity due to excess calories with body mass index (BMI) of 34.0 to 34.9 in adult (Chronic)    The patient understands the need to lose weight with diet and exercise. We have discussed specific strategies for this.      Dyspnea, unspecified - Primary    Shortness of breath is usually associate with tachycardia.  For now we will assess tachycardia with a event monitor, and based on results consider Echo versus stress test.      Relevant Orders   EKG 12-Lead (Completed)   Family history of early CAD (Chronic)    Mother and brother with CAD and father with stroke. Once we assess the dyspnea and tachycardia issue, we can discuss the possibility of preliminary screening evaluation Coronary Calcium Score to determine the appropriateness of  treating lipids and blood sugars.      Prediabetes (Chronic)    Pre-Diabetes A1c of 6.6 in October, not currently on medication but metformin being considered by primary care physician. -Continue to monitor A1c and follow primary care physician's plan regarding potential initiation of metformin.      Rapid palpitations    Shortness of Breath and Tachycardia Intermittent episodes of shortness of breath and tachycardia, both at rest and with exertion. Episodes are not daily and can occur randomly. No associated chest pain, syncope, or significant leg swelling. Mild ankle swelling and left shin pain reported. -Order 28-day cardiac event monitor to capture episodes and determine if she is due to an abnormal rhythm or sinus tachycardia. -Instruct patient to press the button on the monitor and log symptoms during episodes.               Follow-Up: Return in about 4 months (around 11/18/2023).Schedule follow-up appointment in February-March to discuss results of cardiac event monitor. If urgent findings are detected on the monitor, she will be seen sooner.   Total time  spent: 18 min spent with patient + 16 min spent charting = 34 min    Signed, Marykay Lex, MD, MS Bryan Lemma, M.D., M.S. Interventional Cardiologist  [facility]  Pager # [phone] Phone # [phone] [address] [place], [place] [address]

## 2023-07-21 NOTE — Patient Instructions (Addendum)
Medication Instructions:  No changes     Lab Work: Not needed    Testing/Procedures:  Will be mailed to you in 3 to 7 days Your physician has recommended that you wear a holter monitor 2 x 14 day Zio ( you wear 2 separate monitors back to back)Holter monitors are medical devices that record the heart's electrical activity. Doctors most often use these monitors to diagnose arrhythmias. Arrhythmias are problems with the speed or rhythm of the heartbeat. The monitor is a small, portable device. You can wear one while you do your normal daily activities. This is usually used to diagnose what is causing palpitations/syncope (passing out).    Follow-Up: At [facility], you and your health needs are our priority.  As part of our continuing mission to provide you with exceptional heart care, we have created designated Provider Care Teams.  These Care Teams include your primary Cardiologist (physician) and Advanced Practice Providers (APPs -  Physician Assistants and Nurse Practitioners) who all work together to provide you with the care you need, when you need it.     Your next appointment:   4 month(s)  The format for your next appointment:   In Person  Provider:   Bryan Lemma, MD    Other Instructions  ZIO XT- Long Term Monitor Instructions  Your physician has requested you wear a ZIO patch monitor for 14 days X 2 This is a single patch monitor. [employer] supplies one patch monitor per enrollment. Additional stickers are not available. Please do not apply patch if you will be having a Nuclear Stress Test,  Echocardiogram, Cardiac CT, MRI, or Chest Xray during the period you would be wearing the  monitor. The patch cannot be worn during these tests. You cannot remove and re-apply the  ZIO XT patch monitor.  Your ZIO patch monitor will be mailed 3 day USPS to your address on file. It may take 3-5 days  to receive your monitor after you have been enrolled.  Once you have received  your monitor, please review the enclosed instructions. Your monitor  has already been registered assigning a specific monitor serial # to you.  Billing and Patient Assistance Program Information  We have supplied [employer] with any of your insurance information on file for billing purposes. [employer] offers a sliding scale Patient Assistance Program for patients that do not have  insurance, or whose insurance does not completely cover the cost of the ZIO monitor.  You must apply for the Patient Assistance Program to qualify for this discounted rate.  To apply, please call [employer] at [phone], select option 4, select option 2, ask to apply for  Patient Assistance Program. Meredeth Ide will ask your household income, and how many people  are in your household. They will quote your out-of-pocket cost based on that information.  [employer] will also be able to set up a 49-month, interest-free payment plan if needed.  Applying the monitor   Shave hair from upper left chest.  Hold abrader disc by orange tab. Rub abrader in 40 strokes over the upper left chest as  indicated in your monitor instructions.  Clean area with 4 enclosed alcohol pads. Let dry.  Apply patch as indicated in monitor instructions. Patch will be placed under collarbone on left  side of chest with arrow pointing upward.  Rub patch adhesive wings for 2 minutes. Remove white label marked "1". Remove the white  label marked "2". Rub patch adhesive wings for 2 additional minutes.  While  looking in a mirror, press and release button in center of patch. A small green light will  flash 3-4 times. This will be your only indicator that the monitor has been turned on.  Do not shower for the first 24 hours. You may shower after the first 24 hours.  Press the button if you feel a symptom. You will hear a small click. Record Date, Time and  Symptom in the Patient Logbook.  When you are ready to remove the patch, follow instructions on the last  2 pages of Patient  Logbook. Stick patch monitor onto the last page of Patient Logbook.  Place Patient Logbook in the blue and white box. Use locking tab on box and tape box closed  securely. The blue and white box has prepaid postage on it. Please place it in the mailbox as  soon as possible. Your physician should have your test results approximately 7 days after the  monitor has been mailed back to [facility].  Call [facility] Customer Care at [phone] if you have questions regarding  your ZIO XT patch monitor. Call them immediately if you see an orange light blinking on your  monitor.  If your monitor falls off in less than 4 days, contact our Monitor department at [phone].  If your monitor becomes loose or falls off after 4 days call [employer] at [phone] for  suggestions on securing your monitor

## 2023-07-21 NOTE — Progress Notes (Unsigned)
Enrolled for [employer] to mail a ZIO XT long term holter monitor to the patients address on file.   Patient to wear 2 monitors First monitor wear 14 days Second monitor wear 7 days (enrolled to mail 11/12)

## 2023-07-21 NOTE — Progress Notes (Unsigned)
Enrolled for [employer] to mail a ZIO XT long term holter monitor to the patients address on file. Requested to ship 07/29/23.  Patient wearing 2 ZIO XT monitors Wear 1st monitor for 14 days then wear 2nd monitor for 7 days

## 2023-07-22 ENCOUNTER — Encounter: Payer: Self-pay | Admitting: Cardiology

## 2023-07-22 DIAGNOSIS — E785 Hyperlipidemia, unspecified: Secondary | ICD-10-CM | POA: Insufficient documentation

## 2023-07-22 DIAGNOSIS — Z8249 Family history of ischemic heart disease and other diseases of the circulatory system: Secondary | ICD-10-CM | POA: Insufficient documentation

## 2023-07-22 NOTE — Assessment & Plan Note (Signed)
Shortness of Breath and Tachycardia Intermittent episodes of shortness of breath and tachycardia, both at rest and with exertion. Episodes are not daily and can occur randomly. No associated chest pain, syncope, or significant leg swelling. Mild ankle swelling and left shin pain reported. -Order 28-day cardiac event monitor to capture episodes and determine if she is due to an abnormal rhythm or sinus tachycardia. -Instruct patient to press the button on the monitor and log symptoms during episodes.

## 2023-07-22 NOTE — Assessment & Plan Note (Signed)
Shortness of breath is usually associate with tachycardia.  For now we will assess tachycardia with a event monitor, and based on results consider Echo versus stress test.

## 2023-07-22 NOTE — Assessment & Plan Note (Signed)
Pre-Diabetes A1c of 6.6 in October, not currently on medication but metformin being considered by primary care physician. -Continue to monitor A1c and follow primary care physician's plan regarding potential initiation of metformin.

## 2023-07-22 NOTE — Assessment & Plan Note (Signed)
The patient understands the need to lose weight with diet and exercise. We have discussed specific strategies for this.  

## 2023-07-22 NOTE — Assessment & Plan Note (Signed)
LDL slightly elevated at 118, potentially requiring medication if unable to be controlled with dietary changes. -Discuss potential need for cholesterol-lowering medication at next visit.

## 2023-07-22 NOTE — Assessment & Plan Note (Signed)
Mother and brother with CAD and father with stroke. Once we assess the dyspnea and tachycardia issue, we can discuss the possibility of preliminary screening evaluation Coronary Calcium Score to determine the appropriateness of treating lipids and blood sugars.

## 2023-07-22 NOTE — Assessment & Plan Note (Signed)
Well-controlled BP on current dose of Zestoretic 20-12.5 mg daily

## 2023-07-24 ENCOUNTER — Ambulatory Visit: Payer: BC Managed Care – PPO | Admitting: Physical Therapy

## 2023-07-31 ENCOUNTER — Ambulatory Visit: Payer: BC Managed Care – PPO | Attending: Sports Medicine | Admitting: Physical Therapy

## 2023-07-31 ENCOUNTER — Encounter: Payer: BC Managed Care – PPO | Admitting: Physical Therapy

## 2023-08-11 ENCOUNTER — Telehealth: Payer: Self-pay | Admitting: Cardiology

## 2023-08-11 ENCOUNTER — Ambulatory Visit: Payer: BC Managed Care – PPO | Admitting: Family Medicine

## 2023-08-11 NOTE — Telephone Encounter (Signed)
Spoke to patient who stated that she removed her heart monitor after 12 days. Patient report it was irritating her skin. She did call the company and returned the monitor. She received another monitor and will apply that one tonight.

## 2023-08-11 NOTE — Telephone Encounter (Signed)
Pt states that she had remove her heart monitor after 11 days because it is irritating her skin . Please advise

## 2023-08-12 ENCOUNTER — Ambulatory Visit: Payer: BC Managed Care – PPO | Admitting: Family Medicine

## 2023-08-12 ENCOUNTER — Telehealth: Payer: Self-pay | Admitting: Family Medicine

## 2023-08-12 NOTE — Telephone Encounter (Signed)
NS no reason available

## 2023-08-19 NOTE — Telephone Encounter (Signed)
 1st missed visit, letter sent via mychart

## 2023-10-16 ENCOUNTER — Ambulatory Visit: Payer: BC Managed Care – PPO | Admitting: Family Medicine

## 2023-10-17 ENCOUNTER — Ambulatory Visit (INDEPENDENT_AMBULATORY_CARE_PROVIDER_SITE_OTHER): Payer: 59 | Admitting: Family Medicine

## 2023-10-17 ENCOUNTER — Encounter: Payer: Self-pay | Admitting: Family Medicine

## 2023-10-17 VITALS — BP 144/82 | HR 90 | Temp 97.8°F | Wt 219.4 lb

## 2023-10-17 DIAGNOSIS — R8290 Unspecified abnormal findings in urine: Secondary | ICD-10-CM | POA: Diagnosis not present

## 2023-10-17 DIAGNOSIS — M25512 Pain in left shoulder: Secondary | ICD-10-CM | POA: Diagnosis not present

## 2023-10-17 DIAGNOSIS — I10 Essential (primary) hypertension: Secondary | ICD-10-CM | POA: Diagnosis not present

## 2023-10-17 DIAGNOSIS — R7303 Prediabetes: Secondary | ICD-10-CM | POA: Diagnosis not present

## 2023-10-17 DIAGNOSIS — M1711 Unilateral primary osteoarthritis, right knee: Secondary | ICD-10-CM

## 2023-10-17 MED ORDER — IBUPROFEN 600 MG PO TABS: 600.0000 mg | ORAL_TABLET | Freq: Three times a day (TID) | ORAL | 0 refills | Status: DC | PRN

## 2023-10-17 NOTE — Progress Notes (Signed)
Assessment/Plan:   Assessment and Plan    Urinary Tract Infection (UTI) Presents with strong-smelling urine, slight abdominal pain, and diarrhea since Monday. Symptoms have slightly improved. No fever, hematuria, or nausea/vomiting. Differential includes UTI and possible kidney stones. Prediabetic and on lisinopril and hydrochlorothiazide for hypertension. Discussed risks of untreated UTI, benefits of early treatment, and preventive measures including cranberry juice and increased fluid intake. Azo can relieve burning but may change urine color. - Obtain urine specimen for urinalysis and culture - Encourage increased fluid intake - Recommend cranberry juice (without added sugar) - Consider over-the-counter Azo for burning sensation if it occurs - Advise to visit the emergency department if pain becomes severe or if there is significant nausea/vomiting - Follow up if symptoms persist or worsen  Left Shoulder Pain Sudden onset of left shoulder pain without known injury, described as a pinching sensation exacerbated by movement. Likely musculoskeletal. Discussed ibuprofen for inflammation and pain relief, with risks including gastrointestinal upset and potential kidney impact with long-term use. - Recommend ibuprofen 600 mg every 8 hours as needed - Monitor for improvement and reassess if pain persists  Arthritis Chronic pain in wrists and other joints. Previously prescribed meloxicam but currently out of medication. Discussed ibuprofen for pain management, with risks including gastrointestinal upset and potential kidney impact with long-term use. - Recommend ibuprofen 600 mg every 8 hours as needed for joint pain - Monitor for pain relief and reassess if necessary  Hypertension Blood pressure mildly elevated at 144/82 mmHg, possibly due to discomfort. On lisinopril and hydrochlorothiazide. Discussed importance of blood pressure control to prevent complications such as stroke and heart  disease. - Continue current antihypertensive medications - Monitor blood pressure regularly  Prediabetes Prediabetic with frequent water intake. No excessive thirst or other symptoms indicative of diabetes progression. Discussed importance of lifestyle modifications to prevent progression to diabetes. - Continue monitoring blood glucose levels - Maintain a healthy diet and regular exercise  Follow-up - Follow up if urine test is inconclusive or if symptoms persist - Return for further evaluation if joint pain or shoulder pain does not improve.         There are no discontinued medications.  No follow-ups on file.    Subjective:   Encounter date: 10/17/2023  Marie Tate is a 63 y.o. female who has Bacterial vaginosis; Epigastric pain; Dysuria; Hiatal hernia; GERD (gastroesophageal reflux disease); Unilateral primary osteoarthritis, right knee; Essential hypertension; Hepatic steatosis; Liver lesion, right lobe; Class 1 obesity due to excess calories with body mass index (BMI) of 34.0 to 34.9 in adult; Prediabetes; Hyperglycemia; Urticaria; Rapid palpitations; Dyspnea, unspecified; Hyperlipidemia with target LDL less than 100; and Family history of early CAD on their problem list..   She  has a past medical history of Acid reflux, Fatty liver, and Hypertension..   She presents with chief complaint of Urine odor (Urine odor,abdominal pain and diarrhea x Monday. Pinched nerve in left armpit. ) .  Discussed the use of AI scribe software for clinical note transcription with the patient, who gave verbal consent to proceed.  History of Present Illness   The patient presents with concerns about urine odor, abdominal pain, and diarrhea.  She has experienced a strong odor in her urine for the past two to three days. Her fluid intake primarily consists of water, but she consumed a couple of glasses of wine over the weekend, which she suspects may be related to the odor. She describes  her urine as 'soggy' during urination. No  excessive thirst, dysuria, urinary frequency, fever, or hematuria.  She reports slight abdominal pain, rated as 5 out of 10, and slight diarrhea, with two episodes this week. These symptoms began on Monday and have shown slight improvement. She associates the onset of symptoms with alcohol consumption over the weekend. The abdominal discomfort is localized to the front lower abdomen. No nausea, vomiting, or specific triggers for the abdominal pain.  She is concerned about a pinched nerve in her left arm, with pain in the left shoulder that started suddenly on Wednesday night. The pain is described as a pinching sensation that occurs with movement or pressure on the shoulder. She has not undergone any imaging of the shoulder and denies any injury to the area.  Her past medical history includes prediabetes and arthritis, with chronic pain in her wrist. She is on lisinopril and hydrochlorothiazide for hypertension management and has previously used meloxicam for knee pain and arthritis, but she is currently out of it. She has been using Tylenol for pain relief, which provides temporary relief. No use of ibuprofen or other medications for her current symptoms.      Review of Systems  All other systems reviewed and are negative.   Past Surgical History:  Procedure Laterality Date   EYE SURGERY Right    cataract   UMBILICAL HERNIA REPAIR     UTERINE FIBROID SURGERY     x 2    Outpatient Medications Prior to Visit  Medication Sig Dispense Refill   cetirizine (ZYRTEC) 10 MG tablet Take 1 tablet (10 mg total) by mouth daily. 30 tablet 11   lisinopril-hydrochlorothiazide (ZESTORETIC) 20-12.5 MG tablet Take 1 tablet by mouth daily. 90 tablet 3   Magnesium 250 MG TABS Take 250 mg by mouth as needed.     meloxicam (MOBIC) 15 MG tablet Take 1 tablet (15 mg total) by mouth daily. 30 tablet 1   methocarbamol (ROBAXIN) 500 MG tablet Take 1 tablet (500 mg total) by  mouth at bedtime as needed for muscle spasms. 10 tablet 0   VITAMIN D, CHOLECALCIFEROL, PO Take by mouth.     vitamin E 1000 UNIT capsule Take 1,000 Units by mouth daily.     buPROPion (WELLBUTRIN XL) 150 MG 24 hr tablet Take 150 mg by mouth daily. (Patient not taking: Reported on 10/17/2023)     No facility-administered medications prior to visit.    Family History  Problem Relation Age of Onset   Hypertension Mother    Heart attack Mother    Hypertension Father    Stroke Father    Cancer Sister        Stomach(?)   Kidney disease Sister    Diabetes Brother    Seizures Brother    Heart attack Brother    Stomach cancer Maternal Aunt    Tuberculosis Other    Colon cancer Neg Hx    Esophageal cancer Neg Hx    Liver cancer Neg Hx    Pancreatic cancer Neg Hx     Social History   Socioeconomic History   Marital status: Married    Spouse name: Not on file   Number of children: 2   Years of education: Not on file   Highest education level: Not on file  Occupational History   Occupation: Disabled  Tobacco Use   Smoking status: Former    Current packs/day: 0.00    Types: Cigarettes    Quit date: 2010    Years since quitting: 15.0  Smokeless tobacco: Never  Vaping Use   Vaping status: Never Used  Substance and Sexual Activity   Alcohol use: Yes    Comment: occ   Drug use: No   Sexual activity: Yes    Birth control/protection: Surgical  Other Topics Concern   Not on file  Social History Narrative   Not on file   Social Drivers of Health   Financial Resource Strain: Not on file  Food Insecurity: Not on file  Transportation Needs: Not on file  Physical Activity: Not on file  Stress: Not on file  Social Connections: Unknown (05/14/2022)   Received from [facility], [facility]   Social Network    Social Network: Not on file  Intimate Partner Violence: Unknown (05/14/2022)   Received from [facility], [facility]   HITS    Physically Hurt: Not on file     Insult or Talk Down To: Not on file    Threaten Physical Harm: Not on file    Scream or Curse: Not on file                                                                                                  Objective:  Physical Exam: BP (!) 144/82   Pulse 90   Temp 97.8 F (36.6 C) (Temporal)   Wt 219 lb 6.4 oz (99.5 kg)   LMP 06/03/2016 (Approximate)   SpO2 97%   BMI 35.41 kg/m     Physical Exam Constitutional:      General: She is not in acute distress.    Appearance: Normal appearance. She is not ill-appearing or toxic-appearing.  HENT:     Head: Normocephalic and atraumatic.     Nose: Nose normal. No congestion.  Eyes:     General: No scleral icterus.    Extraocular Movements: Extraocular movements intact.  Cardiovascular:     Rate and Rhythm: Normal rate and regular rhythm.     Pulses: Normal pulses.     Heart sounds: Normal heart sounds.  Pulmonary:     Effort: Pulmonary effort is normal. No respiratory distress.     Breath sounds: Normal breath sounds.  Abdominal:     General: Abdomen is flat. Bowel sounds are normal.     Palpations: Abdomen is soft.     Tenderness: There is no abdominal tenderness.  Musculoskeletal:        General: Normal range of motion.     Right shoulder: Tenderness present. No crepitus.       Arms:  Lymphadenopathy:     Cervical: No cervical adenopathy.  Skin:    General: Skin is warm and dry.     Findings: No rash.  Neurological:     General: No focal deficit present.     Mental Status: She is alert and oriented to person, place, and time. Mental status is at baseline.  Psychiatric:        Mood and Affect: Mood normal.        Behavior: Behavior normal.        Thought Content: Thought content normal.  Judgment: Judgment normal.     No results found.  No results found for this or any previous visit (from the past 2160 hours).      Garner Nash, MD, MS

## 2023-10-17 NOTE — Patient Instructions (Signed)
VISIT SUMMARY:  During today's visit, we discussed your concerns about urine odor, abdominal pain, diarrhea, and left shoulder pain. We also reviewed your ongoing management of prediabetes, arthritis, and hypertension.  YOUR PLAN:  -URINARY TRACT INFECTION (UTI): A UTI is an infection in any part of your urinary system. We will obtain a urine specimen for testing, and you should increase your fluid intake and consider drinking cranberry juice without added sugar. Over-the-counter Azo can help with burning sensations if they occur. Please visit the emergency department if your pain becomes severe or if you experience significant nausea or vomiting.  -LEFT SHOULDER PAIN: Your left shoulder pain is likely musculoskeletal, meaning it is related to the muscles or bones. We recommend taking ibuprofen 600 mg every 8 hours as needed for pain relief. Monitor your symptoms and let us know if the pain persists.  -ARTHRITIS: Arthritis is inflammation of the joints, causing pain and stiffness. You can take ibuprofen 600 mg every 8 hours as needed for joint pain. Monitor your pain and follow up if necessary.  -HYPERTENSION: Hypertension is high blood pressure, which can lead to serious health issues if not managed. Continue taking your current medications, lisinopril and hydrochlorothiazide, and monitor your blood pressure regularly.  -PREDIABETES: Prediabetes means your blood sugar levels are higher than normal but not yet high enough to be diagnosed as diabetes. Continue monitoring your blood glucose levels, maintain a healthy diet, and exercise regularly.  INSTRUCTIONS:  Please follow up if your urine test is inconclusive or if your symptoms persist. Return for further evaluation if your joint pain or shoulder pain does not improve.

## 2023-10-19 LAB — URINE CULTURE
MICRO NUMBER:: 16029174
Result:: NO GROWTH
SPECIMEN QUALITY:: ADEQUATE

## 2023-10-19 LAB — CULTURE INDICATED

## 2023-10-19 LAB — URINALYSIS W MICROSCOPIC + REFLEX CULTURE
Bacteria, UA: NONE SEEN /HPF
Bilirubin Urine: NEGATIVE
Glucose, UA: NEGATIVE
Hgb urine dipstick: NEGATIVE
Hyaline Cast: NONE SEEN /LPF
Ketones, ur: NEGATIVE
Nitrites, Initial: NEGATIVE
Specific Gravity, Urine: 1.027 (ref 1.001–1.035)
pH: 5.5 (ref 5.0–8.0)

## 2023-10-20 ENCOUNTER — Ambulatory Visit: Payer: BC Managed Care – PPO | Admitting: Family Medicine

## 2023-10-20 ENCOUNTER — Encounter: Payer: Self-pay | Admitting: Family Medicine

## 2023-10-21 ENCOUNTER — Telehealth: Payer: Self-pay | Admitting: Family Medicine

## 2023-10-21 NOTE — Telephone Encounter (Signed)
08/12/2023 no show 10/16/2023 same day cancel by [email or web address], no reason noted  Final warning sent via mail and mychart

## 2023-10-24 ENCOUNTER — Encounter: Payer: Self-pay | Admitting: Family Medicine

## 2023-10-24 ENCOUNTER — Ambulatory Visit: Payer: 59 | Admitting: Family Medicine

## 2023-10-24 VITALS — BP 130/76 | HR 76 | Temp 98.4°F | Ht 66.0 in | Wt 220.0 lb

## 2023-10-24 DIAGNOSIS — Z23 Encounter for immunization: Secondary | ICD-10-CM | POA: Diagnosis not present

## 2023-10-24 DIAGNOSIS — I10 Essential (primary) hypertension: Secondary | ICD-10-CM

## 2023-10-24 DIAGNOSIS — I471 Supraventricular tachycardia, unspecified: Secondary | ICD-10-CM | POA: Diagnosis not present

## 2023-10-24 DIAGNOSIS — R7303 Prediabetes: Secondary | ICD-10-CM

## 2023-10-24 LAB — BASIC METABOLIC PANEL
BUN: 12 mg/dL (ref 6–23)
CO2: 28 meq/L (ref 19–32)
Calcium: 9.5 mg/dL (ref 8.4–10.5)
Chloride: 103 meq/L (ref 96–112)
Creatinine, Ser: 0.69 mg/dL (ref 0.40–1.20)
GFR: 94.35 mL/min (ref >60.00)
Glucose, Bld: 90 mg/dL (ref 70–99)
Potassium: 4 meq/L (ref 3.5–5.1)
Sodium: 141 meq/L (ref 135–145)

## 2023-10-24 LAB — HEMOGLOBIN A1C: Hgb A1c MFr Bld: 6.3 % (ref 4.6–6.5)

## 2023-10-24 NOTE — Assessment & Plan Note (Signed)
 Lab testing has shown mixed results. I will repeat her A1c and blood glucose today. If this does confirm diabetes, I will start her on metformin and refer her to diabetes education.

## 2023-10-24 NOTE — Assessment & Plan Note (Signed)
 Zio patch shows episodes of SVT and possible atrial tachycardia. I suspect she would benefit form a beta blocker. I recommend she follow-up with cardiology regarding this.

## 2023-10-24 NOTE — Progress Notes (Signed)
 [facility] [address] [place] [address] Dept: [phone] Dept Fax: [phone]  Chronic Care Office Visit  Subjective:    Patient ID: Marie Tate, female    DOB: 1961/03/12, 63 y.o..   MRN: [id number]  Chief Complaint  Patient presents with   Diabetes    F/u DM/weight.  No concerns.   BS average 175, 148, 94.    History of Present Illness:  Patient is in today for reassessment of chronic medical issues.   Marie Tate has a history of  hypertension. She is managed on Zestoretic  20-12.5 mg daily.    Marie Tate has a history of prediabetes. She has had an A1c at 6.6%, but with a normal fasting glucose of 96 and no overt signs of diabetes. She has been checking some FSBS at home on her husband's monitor. She has had values range from 94-175.  Marie Tate has had some episodic issues with acute dyspnea. She completed a Zio patch in December, but has not been back to discuss with cardiology.   Past Medical History: Patient Active Problem List   Diagnosis Date Noted   Hyperlipidemia with target LDL less than 100 07/22/2023   Family history of early CAD 07/22/2023   Rapid palpitations 07/21/2023   Dyspnea, unspecified 07/21/2023   Urticaria 07/15/2023   Essential hypertension 12/03/2022   Hepatic steatosis 12/03/2022   Liver lesion, right lobe 12/03/2022   Class 1 obesity due to excess calories with body mass index (BMI) of 34.0 to 34.9 in adult 12/03/2022   Prediabetes 12/03/2022   Unilateral primary osteoarthritis, right knee 04/02/2017   Bacterial vaginosis 07/09/2016   Epigastric pain 07/09/2016   Dysuria 07/09/2016   Hiatal hernia 07/09/2016   GERD (gastroesophageal reflux disease) 07/09/2016   Past Surgical History:  Procedure Laterality Date   EYE SURGERY Right    cataract   UMBILICAL HERNIA REPAIR     UTERINE FIBROID SURGERY     x 2   Family History  Problem Relation Age of Onset    Hypertension Mother    Heart attack Mother    Hypertension Father    Stroke Father    Cancer Sister        Stomach(?)   Kidney disease Sister    Diabetes Brother    Seizures Brother    Heart attack Brother    Stomach cancer Maternal Aunt    Tuberculosis Other    Colon cancer Neg Hx    Esophageal cancer Neg Hx    Liver cancer Neg Hx    Pancreatic cancer Neg Hx    Outpatient Medications Prior to Visit  Medication Sig Dispense Refill   cetirizine  (ZYRTEC ) 10 MG tablet Take 1 tablet (10 mg total) by mouth daily. 30 tablet 11   ibuprofen  (ADVIL ) 600 MG tablet Take 1 tablet (600 mg total) by mouth every 8 (eight) hours as needed. 30 tablet 0   lisinopril -hydrochlorothiazide  (ZESTORETIC ) 20-12.5 MG tablet Take 1 tablet by mouth daily. 90 tablet 3   Magnesium 250 MG TABS Take 250 mg by mouth as needed.     meloxicam  (MOBIC ) 15 MG tablet Take 1 tablet (15 mg total) by mouth daily. 30 tablet 1   methocarbamol  (ROBAXIN ) 500 MG tablet Take 1 tablet (500 mg total) by mouth at bedtime as needed for muscle spasms. 10 tablet 0   VITAMIN D, CHOLECALCIFEROL, PO Take by mouth.     vitamin E 1000 UNIT capsule Take 1,000 Units by  mouth daily.     No facility-administered medications prior to visit.   No Known Allergies Objective:   Today's Vitals   10/24/23 1034  BP: 130/76  Pulse: 76  Temp: 98.4 F (36.9 C)  TempSrc: Temporal  SpO2: 98%  Weight: 220 lb (99.8 kg)  Height: 5' 6 (1.676 m)   Body mass index is 35.51 kg/m.   General: Well developed, well nourished. No acute distress. Psych: Alert and oriented. Normal mood and affect.  Health Maintenance Due  Topic Date Due   HIV Screening  Never done   DTaP/Tdap/Td (1 - Tdap) Never done   Cervical Cancer Screening (HPV/Pap Cotest)  09/20/2022   Lab Results    Latest Ref Rng & Units 07/04/2023    3:01 PM 09/06/2022    8:30 AM 06/30/2022    5:05 PM  CMP  Glucose 65 - 99 mg/dL 96  899  880   BUN 7 - 25 mg/dL 11  12  14     Creatinine 0.50 - 1.05 mg/dL 9.36  9.26  9.16   Sodium 135 - 146 mmol/L 139  141  140   Potassium 3.5 - 5.3 mmol/L 4.5  3.6  4.2   Chloride 98 - 110 mmol/L 102  107  105   CO2 20 - 32 mmol/L 26  27  28    Calcium 8.6 - 10.4 mg/dL 9.9  9.2  9.5   Total Protein 6.5 - 8.1 g/dL  7.0    Total Bilirubin 0.3 - 1.2 mg/dL  0.6    Alkaline Phos 38 - 126 U/L  77    AST 15 - 41 U/L  16    ALT 0 - 44 U/L  16     Last hemoglobin A1c Lab Results  Component Value Date   HGBA1C 6.6 (H) 07/04/2023   Zio Patch (09/14/2024) Patch Wear Time:  13 days and 17 hours (2024-11-09T15:37:22-0500 to 2024-11-23T09:12:33-0500)   Patient had a min HR of 49 bpm, max HR of 214 bpm, and avg HR of 77 bpm. Predominant underlying rhythm was Sinus Rhythm. 28 Supraventricular Tachycardia runs occurred, the run with the fastest interval lasting 6 beats with a max rate of 214 bpm, the longest lasting 16 beats with an avg rate of 112 bpm. Some episodes of Supraventricular Tachycardia may be possible Atrial Tachycardia with variable block. Supraventricular Tachycardia was detected within +/- 45 seconds of symptomatic patient event(s).   Isolated SVEs were rare (<1.0%), SVE Couplets were rare (<1.0%), and SVE Triplets were rare (<1.0%). Isolated VEs were rare (<1.0%), and no VE Couplets or VE Triplets were present.    Assessment & Plan:   Problem List Items Addressed This Visit       Cardiovascular and Mediastinum   Essential hypertension - Primary (Chronic)   BP is in acceptable control. Continue lisinopril -HCTZ (Zestoretic ) 20-12.5 mg daily.      PSVT (paroxysmal supraventricular tachycardia) (HCC)   Zio patch shows episodes of SVT and possible atrial tachycardia. I suspect she would benefit form a beta blocker. I recommend she follow-up with cardiology regarding this.        Other   Prediabetes (Chronic)   Lab testing has shown mixed results. I will repeat her A1c and blood glucose today. If this does confirm  diabetes, I will start her on metformin and refer her to diabetes education.      Relevant Orders   Hemoglobin A1c   Basic metabolic panel   Other Visit Diagnoses  Need for shingles vaccine       Relevant Orders   Varicella-zoster vaccine IM (Completed)     Need for Tdap vaccination       Relevant Orders   Tdap vaccine greater than or equal to 7yo IM (Completed)       Return in about 3 months (around 01/21/2024) for Reassessment.   Garnette CHRISTELLA Simpler, MD

## 2023-10-24 NOTE — Assessment & Plan Note (Signed)
 BP is in acceptable control. Continue lisinopril -HCTZ (Zestoretic ) 20-12.5 mg daily.

## 2023-10-31 DIAGNOSIS — N760 Acute vaginitis: Secondary | ICD-10-CM | POA: Diagnosis not present

## 2023-10-31 DIAGNOSIS — N898 Other specified noninflammatory disorders of vagina: Secondary | ICD-10-CM | POA: Diagnosis not present

## 2023-11-03 DIAGNOSIS — R194 Change in bowel habit: Secondary | ICD-10-CM | POA: Diagnosis not present

## 2023-11-03 DIAGNOSIS — R197 Diarrhea, unspecified: Secondary | ICD-10-CM | POA: Diagnosis not present

## 2023-12-08 ENCOUNTER — Ambulatory Visit: Payer: BC Managed Care – PPO | Admitting: Cardiology

## 2023-12-09 ENCOUNTER — Encounter: Payer: Self-pay | Admitting: Family Medicine

## 2023-12-09 DIAGNOSIS — K635 Polyp of colon: Secondary | ICD-10-CM | POA: Diagnosis not present

## 2023-12-09 DIAGNOSIS — Z1211 Encounter for screening for malignant neoplasm of colon: Secondary | ICD-10-CM | POA: Diagnosis not present

## 2023-12-09 LAB — HM COLONOSCOPY

## 2023-12-22 ENCOUNTER — Encounter: Payer: Self-pay | Admitting: Family Medicine

## 2024-01-04 ENCOUNTER — Encounter: Payer: Self-pay | Admitting: Cardiology

## 2024-01-04 DIAGNOSIS — R0600 Dyspnea, unspecified: Secondary | ICD-10-CM | POA: Diagnosis not present

## 2024-01-04 DIAGNOSIS — R002 Palpitations: Secondary | ICD-10-CM | POA: Diagnosis not present

## 2024-01-07 DIAGNOSIS — L290 Pruritus ani: Secondary | ICD-10-CM | POA: Diagnosis not present

## 2024-01-07 DIAGNOSIS — K645 Perianal venous thrombosis: Secondary | ICD-10-CM | POA: Diagnosis not present

## 2024-01-26 ENCOUNTER — Ambulatory Visit: Payer: 59 | Admitting: Family Medicine

## 2024-02-02 ENCOUNTER — Ambulatory Visit: Attending: Cardiology | Admitting: Cardiology

## 2024-02-02 ENCOUNTER — Encounter: Payer: Self-pay | Admitting: Cardiology

## 2024-02-02 VITALS — BP 140/60 | HR 82 | Ht 65.0 in | Wt 220.8 lb

## 2024-02-02 DIAGNOSIS — R002 Palpitations: Secondary | ICD-10-CM

## 2024-02-02 DIAGNOSIS — R7303 Prediabetes: Secondary | ICD-10-CM | POA: Diagnosis not present

## 2024-02-02 DIAGNOSIS — I471 Supraventricular tachycardia, unspecified: Secondary | ICD-10-CM

## 2024-02-02 DIAGNOSIS — E785 Hyperlipidemia, unspecified: Secondary | ICD-10-CM | POA: Diagnosis not present

## 2024-02-02 DIAGNOSIS — I10 Essential (primary) hypertension: Secondary | ICD-10-CM

## 2024-02-02 DIAGNOSIS — Z8249 Family history of ischemic heart disease and other diseases of the circulatory system: Secondary | ICD-10-CM

## 2024-02-02 NOTE — Patient Instructions (Signed)
 Medication Instructions:   Recommend follow up with Dr Therese Flash- in 1 to 2 months - reassess blood pressure  *If you need a refill on your cardiac medications before your next appointment, please call your pharmacy*   Lab Work: Not needed    Testing/Procedures:  Not needed  Follow-Up: At [facility], you and your health needs are our priority.  As part of our continuing mission to provide you with exceptional heart care, we have created designated Provider Care Teams.  These Care Teams include your primary Cardiologist (physician) and Advanced Practice Providers (APPs -  Physician Assistants and Nurse Practitioners) who all work together to provide you with the care you need, when you need it.     Your next appointment:   As needed  The format for your next appointment:   In Person  Provider:   Randene Bustard, MD

## 2024-02-02 NOTE — Progress Notes (Signed)
 Cardiology Office Note:  .   Date:  02/06/2024  ID:  Marie Tate, DOB 1961/06/17, MRN [id number] PCP: Marie Lawyer, MD  [facility] Providers Cardiologist:  Marie Bustard, MD     No chief complaint on file.   Patient Profile: .     Marie Tate is an obese 63 y.o. female with a PMH notable for HTN, borderline HLD and pre-DM-2, with a family history of CAD who presents here for 78-month follow-up evaluation of Dyspnea and Palpitations at the request of Marie Lawyer, MD.  She was seen for initial evaluation on July 21, 2023 for initial consultation-EVALUATION of SHORTNESS OF BREATH At the request of Marie Lawyer, MD. Marie Tate intermittent episodes of shortness of breath and tachycardia occurring both at rest and on exertion.  Felt like her heart was beating very fast but mostly regular.  Sporadic with no other coordinated timeframe.  Most of her symptoms seem to be related to tachycardia episodes and therefore we chose to start off with event monitor and then adjust based on the results. -> We discussed possibly starting statin based on LDL of 118.  Also discussed potential echo versus stress test given her family history of mother and brother with CAD and father with stroke.    Subjective  Discussed the use of AI scribe software for clinical note transcription with the patient, who gave verbal consent to proceed.  History of Present Illness Marie Tate presents with episodes of fast heart rate and palpitations.  She experiences episodes of fast heart rate and palpitations lasting about five to ten minutes, often alleviated by sitting down and calming herself. These episodes are more frequent during stress but rarely occur during exercise. Some episodes have been recorded during sleep.  No significant shortness of breath except during episodes of fast heart rate. No chest pain, pressure, or tightness unless stressed. No shortness of breath when lying  flat or waking up at night. Mild shortness of breath during exercise, which does not limit activities.  She has a history of hypertension, managed with lisinopril  HCTZ daily. Blurred vision occurs at times, associated with higher blood pressure readings.  She previously lost thirty pounds on Ozempic , which improved her symptoms, but has since regained the weight.    Objective   Medications - Lisinopril  HCTZ every morning (had not taken today) - Zyrtec  10 mg daily  Studies Reviewed: .        14-day Zio patch monitor November 2024:   Predominant rhythm is Sinus Rhythm: Rate range 49 to 148 bpm and average of 77 bpm.   Rare PACs and PVCs noted   28 Atrial Runs: Fastest run-6 beats (2 seconds), rate range 128-214 bpm with an average of 178 bpm; longest was 16 (8.2 seconds) beats with a rate range of 97-133 bpm and an average rate of 112 bpm   Symptoms noted with sinus rhythm, sinus tachycardia, sinus rhythm with isolated PACs as well as PAC couplets, triplets and at least 7-8 atrial runs were noted as well.  Risk Assessment/Calculations:     HYPERTENSION CONTROL Vitals:   02/02/24 1431 02/02/24 1506  BP: (!) 154/78 (!) 140/60    The patient's blood pressure is elevated above target today. Plan is to monitor and readdress in follow-up.  Avoid salt.  Make sure she is taking her medications.  Follow blood pressures at home         Physical Exam:   VS:  BP (!) 140/60  Pulse 82   Ht 5\' 5"  (1.651 m)   Wt 220 lb 12.8 oz (100.2 kg)   LMP 06/03/2016 (Approximate)   SpO2 98%   BMI 36.74 kg/m    Wt Readings from Last 3 Encounters:  02/02/24 220 lb 12.8 oz (100.2 kg)  10/24/23 220 lb (99.8 kg)  10/17/23 219 lb 6.4 oz (99.5 kg)    GEN: Well nourished, well developed in no acute distress; moderately obese but otherwise healthy-appearing NECK: No JVD; No carotid bruits CARDIAC: RRR with occasional ectopy, Normal S1, S2; no murmurs, rubs, gallops RESPIRATORY:  Clear to  auscultation without rales, wheezing or rhonchi ; nonlabored, good air movement. ABDOMEN: Soft, non-tender, non-distended EXTREMITIES:  No edema; No deformity     ASSESSMENT AND PLAN: .    Problem List Items Addressed This Visit       Cardiology Problems   Essential hypertension (Chronic)   Elevated blood pressure today, previously normal.  On lisinopril  HCTZ (20 mg over 5 mg daily). Possible dietary influence. Blurred vision possibly related. - Check blood pressure regularly at home. - Advised dietary modifications, reduce salt, increase activity. - Consider carvedilol if elevated at next PCP visit.      Hyperlipidemia with target LDL less than 100 (Chronic)   With family history of major CAD, hypertension hyperlipidemia Need to closely monitor lipids.  Currently managed with lifestyle medication.  Labs be followed by PCP.  Reassess on follow-up.      PSVT (paroxysmal supraventricular tachycardia) (HCC) - Primary (Chronic)   History of PSVT now having simply palpitations but not long-lasting. Now she is having intermittent palpitations, brief and infrequent, possibly stress, dehydration, or exertion-related. Holter shows rare skipped beats. - Encouraged regular exercise. - Advised stress management and hydration. - Monitor for increased frequency or severity, return if worsens. -Discussed with vagal maneuvers        Other   Family history of early CAD (Chronic)   Prediabetes (Chronic)   Last A1c was 6.3.  Monitor closely.  Adjust diet.      Rapid palpitations (Chronic)   Very reassuring monitor.  Goes along with her symptoms.  Do not necessarily need to treat, however if additional BP control is required, would use carvedilol.            Follow-Up: Return if symptoms worsen or fail to improve.     Signed, Marie Lacer, MD, MS Marie Tate, M.D., M.S. Interventional Chartered certified accountant  Pager # [phone]

## 2024-02-06 ENCOUNTER — Encounter: Payer: Self-pay | Admitting: Cardiology

## 2024-02-06 NOTE — Assessment & Plan Note (Signed)
 Very reassuring monitor.  Goes along with her symptoms.  Do not necessarily need to treat, however if additional BP control is required, would use carvedilol.

## 2024-02-06 NOTE — Assessment & Plan Note (Signed)
 Elevated blood pressure today, previously normal.  On lisinopril  HCTZ (20 mg over 5 mg daily). Possible dietary influence. Blurred vision possibly related. - Check blood pressure regularly at home. - Advised dietary modifications, reduce salt, increase activity. - Consider carvedilol  if elevated at next PCP visit.

## 2024-02-06 NOTE — Assessment & Plan Note (Signed)
 Last A1c was 6.3.  Monitor closely.  Adjust diet.

## 2024-02-06 NOTE — Assessment & Plan Note (Signed)
 With family history of major CAD, hypertension hyperlipidemia Need to closely monitor lipids.  Currently managed with lifestyle medication.  Labs be followed by PCP.  Reassess on follow-up.

## 2024-02-06 NOTE — Assessment & Plan Note (Signed)
 History of PSVT now having simply palpitations but not long-lasting. Now she is having intermittent palpitations, brief and infrequent, possibly stress, dehydration, or exertion-related. Holter shows rare skipped beats. - Encouraged regular exercise. - Advised stress management and hydration. - Monitor for increased frequency or severity, return if worsens. -Discussed with vagal maneuvers

## 2024-02-07 ENCOUNTER — Emergency Department (HOSPITAL_COMMUNITY)
Admission: EM | Admit: 2024-02-07 | Discharge: 2024-02-07 | Disposition: A | Discharge status: Home / Self Care | Attending: Emergency Medicine | Admitting: Emergency Medicine

## 2024-02-07 ENCOUNTER — Encounter (HOSPITAL_COMMUNITY): Payer: Self-pay

## 2024-02-07 ENCOUNTER — Other Ambulatory Visit: Payer: Self-pay

## 2024-02-07 ENCOUNTER — Emergency Department (HOSPITAL_COMMUNITY)

## 2024-02-07 DIAGNOSIS — R9431 Abnormal electrocardiogram [ECG] [EKG]: Secondary | ICD-10-CM | POA: Insufficient documentation

## 2024-02-07 DIAGNOSIS — R519 Headache, unspecified: Secondary | ICD-10-CM | POA: Diagnosis present

## 2024-02-07 DIAGNOSIS — Z79899 Other long term (current) drug therapy: Secondary | ICD-10-CM | POA: Diagnosis not present

## 2024-02-07 DIAGNOSIS — I10 Essential (primary) hypertension: Secondary | ICD-10-CM | POA: Insufficient documentation

## 2024-02-07 DIAGNOSIS — R030 Elevated blood-pressure reading, without diagnosis of hypertension: Secondary | ICD-10-CM

## 2024-02-07 LAB — CBC WITH DIFFERENTIAL/PLATELET
Abs Immature Granulocytes: 0.04 10*3/uL (ref 0.00–0.07)
Basophils Absolute: 0 10*3/uL (ref 0.0–0.1)
Basophils Relative: 0 %
Eosinophils Absolute: 0.2 10*3/uL (ref 0.0–0.5)
Eosinophils Relative: 2 %
HCT: 40.7 % (ref 36.0–46.0)
Hemoglobin: 13.1 g/dL (ref 12.0–15.0)
Immature Granulocytes: 0 %
Lymphocytes Relative: 31 %
Lymphs Abs: 3.2 10*3/uL (ref 0.7–4.0)
MCH: 28.7 pg (ref 26.0–34.0)
MCHC: 32.2 g/dL (ref 30.0–36.0)
MCV: 89.3 fL (ref 80.0–100.0)
Monocytes Absolute: 1 10*3/uL (ref 0.1–1.0)
Monocytes Relative: 10 %
Neutro Abs: 5.7 10*3/uL (ref 1.7–7.7)
Neutrophils Relative %: 57 %
Platelets: 315 10*3/uL (ref 150–400)
RBC: 4.56 MIL/uL (ref 3.87–5.11)
RDW: 14.1 % (ref 11.5–15.5)
WBC: 10.2 10*3/uL (ref 4.0–10.5)
nRBC: 0 % (ref 0.0–0.2)

## 2024-02-07 LAB — COMPREHENSIVE METABOLIC PANEL WITH GFR
ALT: 21 U/L (ref 0–44)
AST: 19 U/L (ref 15–41)
Albumin: 4.2 g/dL (ref 3.5–5.0)
Alkaline Phosphatase: 100 U/L (ref 38–126)
Anion gap: 8 (ref 5–15)
BUN: 15 mg/dL (ref 6–20)
CO2: 27 mmol/L (ref 22–32)
Calcium: 9.4 mg/dL (ref 8.9–10.3)
Chloride: 100 mmol/L (ref 98–111)
Creatinine, Ser: 0.75 mg/dL (ref 0.44–1.00)
GFR, Estimated: >60 mL/min (ref >60)
Glucose, Bld: 115 mg/dL — ABNORMAL HIGH (ref 70–99)
Potassium: 3.7 mmol/L (ref 3.5–5.1)
Sodium: 135 mmol/L (ref 135–145)
Total Bilirubin: 0.8 mg/dL (ref 0.0–1.2)
Total Protein: 7.9 g/dL (ref 6.5–8.1)

## 2024-02-07 LAB — TROPONIN I (HIGH SENSITIVITY)
Troponin I (High Sensitivity): 3 ng/L (ref <18)
Troponin I (High Sensitivity): 3 ng/L (ref <18)

## 2024-02-07 LAB — LIPASE, BLOOD: Lipase: 33 U/L (ref 11–51)

## 2024-02-07 MED ORDER — CARVEDILOL 3.125 MG PO TABS
3.1250 mg | ORAL_TABLET | Freq: Once | ORAL | Status: AC
  Administered 2024-02-07: 3.125 mg via ORAL
  Filled 2024-02-07: qty 1

## 2024-02-07 MED ORDER — FLUORESCEIN SODIUM 1 MG OP STRP
1.0000 | ORAL_STRIP | Freq: Once | OPHTHALMIC | Status: AC
  Administered 2024-02-07: 1 via OPHTHALMIC
  Filled 2024-02-07: qty 1

## 2024-02-07 MED ORDER — CARVEDILOL 3.125 MG PO TABS: 3.1250 mg | ORAL_TABLET | Freq: Two times a day (BID) | ORAL | 0 refills | Status: DC

## 2024-02-07 MED ORDER — HYDRALAZINE HCL 20 MG/ML IJ SOLN
2.0000 mg | Freq: Once | INTRAMUSCULAR | Status: AC
  Administered 2024-02-07: 2 mg via INTRAVENOUS
  Filled 2024-02-07: qty 1

## 2024-02-07 MED ORDER — TETRACAINE HCL 0.5 % OP SOLN
2.0000 [drp] | Freq: Once | OPHTHALMIC | Status: AC
  Administered 2024-02-07: 2 [drp] via OPHTHALMIC
  Filled 2024-02-07: qty 4

## 2024-02-07 NOTE — ED Provider Notes (Signed)
 Marie Tate EMERGENCY DEPARTMENT AT [facility] Provider Note   CSN: [id number] Arrival date & time: 02/07/24  3016     History  Chief Complaint  Patient presents with   Hypertension    Marie Tate is a 63 y.o. female.  Patient with a history of hypertension and prediabetes here with elevated blood pressure for the past 1 week.  States she recently got a blood pressure cuff and her blood pressure has been in the 170-190 range at home.  States she normally has pretty well-controlled blood pressure and has been compliant with her lisinopril /hydrochlorothiazide .  No recent missed doses or medication changes.  She saw her cardiologist on May 19 and was told she may require more blood pressure medication of her blood pressure remained high. She was seen for palpitations and found to have many PACS on zio patch.  She denies any chest pain, shortness of breath, nausea or vomiting.  Has a gradual onset headache and denies thunderclap onset.  Has also noticed some blurred vision.  Has floaters at baseline which are unchanged.  No cough or fever. No focal weakness, numbness or tingling.  No substantial visual change but has noticed increased blurry vision and floaters but these are not new.   No shortness of breath, cough or fever.  The history is provided by the patient.  Hypertension Associated symptoms include headaches. Pertinent negatives include no chest pain and no shortness of breath.       Home Medications Prior to Admission medications   Medication Sig Start Date End Date Taking? Authorizing Provider  lisinopril -hydrochlorothiazide  (ZESTORETIC ) 20-12.5 MG tablet Take 1 tablet by mouth daily. 03/11/23   Marie Lawyer, MD  Magnesium 250 MG TABS Take 250 mg by mouth daily at 6 (six) AM.    [provider]  VITAMIN D, CHOLECALCIFEROL, PO Take by mouth daily at 6 (six) AM.    [provider]  vitamin E 1000 UNIT capsule Take 1,000 Units by mouth  daily.    [provider]      Allergies    Patient has no known allergies.    Review of Systems   Review of Systems  Constitutional:  Negative for activity change, appetite change and fever.  HENT:  Negative for congestion and rhinorrhea.   Eyes:  Positive for visual disturbance. Negative for photophobia.  Respiratory:  Negative for cough, chest tightness and shortness of breath.   Cardiovascular:  Negative for chest pain.  Gastrointestinal:  Negative for nausea and vomiting.  Genitourinary:  Negative for dysuria and hematuria.  Musculoskeletal:  Negative for back pain and myalgias.  Skin:  Negative for rash.  Neurological:  Positive for headaches. Negative for dizziness, weakness and light-headedness.    all other systems are negative except as noted in the HPI and PMH.   Physical Exam Updated Vital Signs BP (!) 191/90 (BP Location: Left Arm)   Pulse 96   Temp 98.1 F (36.7 C) (Oral)   Resp 18   Ht 5\' 5"  (1.651 m)   Wt 99.8 kg   LMP 06/03/2016 (Approximate)   SpO2 99%   BMI 36.61 kg/m  Physical Exam Vitals and nursing note reviewed.  Constitutional:      General: She is not in acute distress.    Appearance: She is well-developed.  HENT:     Head: Normocephalic and atraumatic.     Mouth/Throat:     Pharynx: No oropharyngeal exudate.  Eyes:     Intraocular pressure:  Right eye pressure is 16 mmHg. Left eye pressure is 15 mmHg. Measurements were taken using a handheld tonometer.    Conjunctiva/sclera:     Right eye: Right conjunctiva is injected.     Left eye: Left conjunctiva is injected.     Pupils: Pupils are equal, round, and reactive to light.     Comments: Visual fields full to confrontation bilaterally. No retinal detachment seen on bedside ultrasound No areas of Fluorescein uptake.  Intraocular pressures are normal  Neck:     Comments: No meningismus. Cardiovascular:     Rate and Rhythm: Normal rate and regular rhythm.     Heart sounds: Normal  heart sounds. No murmur heard. Pulmonary:     Effort: Pulmonary effort is normal. No respiratory distress.     Breath sounds: Normal breath sounds.  Chest:     Chest wall: No tenderness.  Abdominal:     Palpations: Abdomen is soft.     Tenderness: There is no abdominal tenderness. There is no guarding or rebound.  Musculoskeletal:        General: No tenderness. Normal range of motion.     Cervical back: Normal range of motion and neck supple.  Skin:    General: Skin is warm.  Neurological:     Mental Status: She is alert and oriented to person, place, and time.     Cranial Nerves: No cranial nerve deficit.     Motor: No abnormal muscle tone.     Coordination: Coordination normal.     Comments:  5/5 strength throughout. CN 2-12 intact.Equal grip strength.   Psychiatric:        Behavior: Behavior normal.     ED Results / Procedures / Treatments   Labs (all labs ordered are listed, but only abnormal results are displayed) Labs Reviewed  COMPREHENSIVE METABOLIC PANEL WITH GFR - Abnormal; Notable for the following components:      Result Value   Glucose, Bld 115 (*)    All other components within normal limits  CBC WITH DIFFERENTIAL/PLATELET  LIPASE, BLOOD  TROPONIN I (HIGH SENSITIVITY)  TROPONIN I (HIGH SENSITIVITY)    EKG EKG Interpretation Date/Time:  Saturday Feb 07 2024 07:06:34 EDT Ventricular Rate:  60 PR Interval:  183 QRS Duration:  91 QT Interval:  407 QTC Calculation: 407 R Axis:   16  Text Interpretation: Sinus rhythm Consider left atrial enlargement Borderline T abnormalities, inferior leads T wave inversions inferiorly Confirmed by Marie Tate [phone]) on 02/07/2024 7:18:10 AM  Radiology CT Head Wo Contrast Result Date: 02/07/2024 CLINICAL DATA:  Headache, new onset EXAM: CT HEAD WITHOUT CONTRAST TECHNIQUE: Contiguous axial images were obtained from the base of the skull through the vertex without intravenous contrast. RADIATION DOSE REDUCTION: This  exam was performed according to the departmental dose-optimization program which includes automated exposure control, adjustment of the mA and/or kV according to patient size and/or use of iterative reconstruction technique. COMPARISON:  12/08/2010 FINDINGS: Brain: No evidence of acute infarction, hemorrhage, hydrocephalus, extra-axial collection or mass lesion/mass effect. Vascular: No hyperdense vessel or unexpected calcification. Skull: Normal. Negative for fracture or focal lesion. Sinuses/Orbits: No acute finding. IMPRESSION: Negative head CT. Electronically Signed   By: Marie Tate M.D.   On: 02/07/2024 06:49    Procedures Ultrasound ED Ocular  Date/Time: 02/07/2024 6:04 AM  Performed by: Marie Gloss, MD Authorized by: Marie Gloss, MD   PROCEDURE DETAILS:    Indications: visual change     Eye assessed: bilateral.   Images:  archived   RIGHT EYE FINDINGS:     no foreign body noted in right eye    no evidence of retinal detachment of the right eye LEFT EYE FINDINGS:     no foreign body noted in left eye    no evidence of retinal detachment of the left eye     Medications Ordered in ED Medications  carvedilol (COREG) tablet 3.125 mg (has no administration in time range)  fluorescein ophthalmic strip 1 strip (has no administration in time range)  tetracaine (PONTOCAINE) 0.5 % ophthalmic solution 2 drop (has no administration in time range)    ED Course/ Medical Decision Making/ A&P                                 Medical Decision Making Amount and/or Complexity of Data Reviewed Labs: ordered. Decision-making details documented in ED Course. Radiology: ordered and independent interpretation performed. Decision-making details documented in ED Course. ECG/medicine tests: ordered and independent interpretation performed. Decision-making details documented in ED Course.  Risk Prescription drug management.   Elevated blood pressure for the past 1 week.  Denies  chest pain.  Does have mild headache and floaters in her vision which are unchanged.  Neurological exam is nonfocal. No evidence of retinal detachment on US . Low suspicion for CVA or TIA. IOP normal.   Cardiology records reviewed.  Carvedilol was recommended to be added to her blood pressure medication regimen.  Will give her a dose of this now.  EKG does show new T wave inversions inferiorly. Were present in 02-29-16 but more pronounced now.  Denies chest pain.  Denies shortness of breath. Recent cardiology evaluation for palpitations. Wil trend troponin.   Labs are reassuring without evidence of endorgan damage.  Creatinine normal.  CT head normal.  Troponin negative  Low suspicion for ICH, SAH, meningitis, temporal arteritis.   BP improving to 146/94 after coreg and hydralazine.   Second troponin pending, but low concern for ACS. EKG changes may represent LVH. Will refer back to cardiology for consideration of echo and stress test.   Pending improvement in BP and second troponin at shift change. Dr Marie Tate to assume care.         Final Clinical Impression(s) / ED Diagnoses Final diagnoses:  Elevated blood pressure reading  Abnormal EKG    Rx / DC Orders ED Discharge Orders     None         Marie Tate, Marie [place], MD 02/07/24 02-28-2005

## 2024-02-07 NOTE — Discharge Instructions (Signed)
 Your blood pressure today is elevated but no evidence of any damage to your brain or kidneys.  No evidence of heart attack.  We are adding a blood pressure medication called carvedilol to your regimen.  Take this twice daily and follow-up with your primary doctor for recheck in 1 week.  Return to the ED with exertional chest pain, pain associate with shortness of breath, nausea, vomiting, sweating or other concerns.

## 2024-02-07 NOTE — ED Provider Notes (Signed)
 Care of patient assumed from Dr. Alison Irvine.  This patient presents with 1 week of elevated blood pressure.  Recent symptoms have included headache, epigastric pain, diaphoresis.  She was seen by her cardiologist, Dr. Addie Holstein, 5 days ago.  Recent Zio patch data was reviewed at that time.  She had 28 atrial runs over 14-day period.  These lasted from 2 to 8 seconds.  Recommendation at that time is "if additional BP control is required, would use carvedilol".  Given her elevated blood pressure today, patient was initiated on carvedilol.  She is awaiting second troponin.  If reassuring, discharge is anticipated. Physical Exam  BP (!) 146/94   Pulse 88   Temp 98.3 F (36.8 C) (Oral)   Resp 14   Ht 5\' 5"  (1.651 m)   Wt 99.8 kg   LMP 06/03/2016 (Approximate)   SpO2 100%   BMI 36.61 kg/m   Physical Exam Vitals and nursing note reviewed.  Constitutional:      General: She is not in acute distress.    Appearance: Normal appearance. She is well-developed. She is not ill-appearing, toxic-appearing or diaphoretic.  HENT:     Head: Normocephalic and atraumatic.     Right Ear: External ear normal.     Left Ear: External ear normal.     Nose: Nose normal.     Mouth/Throat:     Mouth: Mucous membranes are moist.  Eyes:     Extraocular Movements: Extraocular movements intact.     Conjunctiva/sclera: Conjunctivae normal.  Cardiovascular:     Rate and Rhythm: Normal rate and regular rhythm.  Pulmonary:     Effort: Pulmonary effort is normal. No respiratory distress.  Abdominal:     General: There is no distension.     Palpations: Abdomen is soft.  Musculoskeletal:        General: No swelling. Normal range of motion.     Cervical back: Normal range of motion and neck supple.  Skin:    General: Skin is warm and dry.     Coloration: Skin is not jaundiced or pale.  Neurological:     General: No focal deficit present.     Mental Status: She is alert and oriented to person, place, and time.      Cranial Nerves: No cranial nerve deficit.     Sensory: No sensory deficit.     Motor: No weakness.     Coordination: Coordination normal.  Psychiatric:        Mood and Affect: Mood normal.        Behavior: Behavior normal.     Procedures  Procedures  ED Course / MDM    Medical Decision Making Amount and/or Complexity of Data Reviewed Labs: ordered. Radiology: ordered. ECG/medicine tests: ordered.  Risk Prescription drug management.   On assessment, patient resting comfortably.  Blood pressure has improved to 130s over 80s.  Repeat troponin is normal.  Patient was discharged in stable condition.       Iva Mariner, MD 02/07/24 [phone]

## 2024-02-07 NOTE — ED Triage Notes (Signed)
 Pt is reporting for the last week that she has had increased blood pressure. She takes her blood pressure medication everyday and has been following healthy lifestyle guidelines as well. She does not report any chest pain or shortness of breath, but does have some head fullness as well as a headache. She has no other complaints at this time.

## 2024-02-11 ENCOUNTER — Telehealth: Payer: Self-pay

## 2024-02-11 NOTE — Telephone Encounter (Signed)
 Left VM to rtn call.  Advised that she could try to schedule with someone else due that is the earliest appointment for Dr Therese Flash.

## 2024-02-11 NOTE — Telephone Encounter (Signed)
 Copied from CRM [id number]. Topic: General - Other >> Feb 11, 2024 10:16 AM Dorisann Garre T wrote: Reason for CRM: patient is requesting to be seen sooner then the 30th she stated her blood pressure is still high she would like a call back regarding this

## 2024-02-13 ENCOUNTER — Ambulatory Visit: Admitting: Family Medicine

## 2024-02-17 ENCOUNTER — Encounter: Payer: Self-pay | Admitting: Family Medicine

## 2024-02-17 ENCOUNTER — Ambulatory Visit (INDEPENDENT_AMBULATORY_CARE_PROVIDER_SITE_OTHER): Admitting: Family Medicine

## 2024-02-17 VITALS — BP 136/78 | HR 76 | Temp 97.3°F | Ht 65.0 in | Wt 220.4 lb

## 2024-02-17 DIAGNOSIS — R7303 Prediabetes: Secondary | ICD-10-CM | POA: Diagnosis not present

## 2024-02-17 DIAGNOSIS — I10 Essential (primary) hypertension: Secondary | ICD-10-CM | POA: Diagnosis not present

## 2024-02-17 MED ORDER — LISINOPRIL-HYDROCHLOROTHIAZIDE 20-12.5 MG PO TABS: 1.0000 | ORAL_TABLET | Freq: Every day | ORAL | 3 refills | Status: AC

## 2024-02-17 MED ORDER — CARVEDILOL 3.125 MG PO TABS: 3.1250 mg | ORAL_TABLET | Freq: Two times a day (BID) | ORAL | 3 refills | Status: DC

## 2024-02-17 NOTE — Assessment & Plan Note (Addendum)
 BP is improved today, though still slightly above goal. Continue lisinopril -HCTZ (Zestoretic ) 20-12.5 mg daily and carvedilol  3.125 mg bid. Encourage efforts at exercise and weight loss.

## 2024-02-17 NOTE — Progress Notes (Signed)
 [facility] [address] [place] [address] Dept: [phone] Dept Fax: [phone]  Chronic Care Office Visit  Subjective:    Patient ID: Marie Tate, female    DOB: 02/01/1961, 63 y.o..   MRN: [id number]  Chief Complaint  Patient presents with   Hypertension    3 month f/u HTN.   BP  199/100 before Carvedilol , 147/80 after.   Average BS  118/140   History of Present Illness:  Patient is in today for reassessment of chronic medical issues.  Marie Tate has a history of  hypertension. She has been managed on Zestoretic  20-12.5 mg daily. She was seen on 5/24 in the ED with a BP of 191/90. There was no sign of any acute cardiac injury. The ED physician did start her on carvedilol  3.125 mg bid, which had been suggested by cardiology. She is tolerating her medicine well and has not had any recurrent symptoms of hypertension.   Marie Tate has a history of prediabetes. In Oct., she had an A1c at 6.6%, but with a normal fasting glucose of 96 and no overt signs of diabetes. Her repeat testing in Feb. showed values in a prediabetes range. She has recently started to do some exercise at home.  Past Medical History: Patient Active Problem List   Diagnosis Date Noted   PSVT (paroxysmal supraventricular tachycardia) (HCC) 10/24/2023   Hyperlipidemia with target LDL less than 100 07/22/2023   Family history of early CAD 07/22/2023   Dyspnea, unspecified 07/21/2023   Urticaria 07/15/2023   Essential hypertension 12/03/2022   Hepatic steatosis 12/03/2022   Liver lesion, right lobe 12/03/2022   Class 2 obesity due to excess calories with body mass index (BMI) of 36.0 to 36.9 in adult 12/03/2022   Prediabetes 12/03/2022   Unilateral primary osteoarthritis, right knee 04/02/2017   Bacterial vaginosis 07/09/2016   Epigastric pain 07/09/2016   Dysuria 07/09/2016   Hiatal hernia 07/09/2016   GERD (gastroesophageal reflux disease)  07/09/2016   Past Surgical History:  Procedure Laterality Date   EYE SURGERY Right    cataract   UMBILICAL HERNIA REPAIR     UTERINE FIBROID SURGERY     x 2   Family History  Problem Relation Age of Onset   Hypertension Mother    Heart attack Mother    Hypertension Father    Stroke Father    Cancer Sister        Stomach(?)   Kidney disease Sister    Diabetes Brother    Seizures Brother    Heart attack Brother    Stomach cancer Maternal Aunt    Tuberculosis Other    Colon cancer Neg Hx    Esophageal cancer Neg Hx    Liver cancer Neg Hx    Pancreatic cancer Neg Hx    Outpatient Medications Prior to Visit  Medication Sig Dispense Refill   Magnesium 250 MG TABS Take 250 mg by mouth daily at 6 (six) AM.     VITAMIN D, CHOLECALCIFEROL, PO Take by mouth daily at 6 (six) AM.     vitamin E 1000 UNIT capsule Take 1,000 Units by mouth daily.     carvedilol  (COREG ) 3.125 MG tablet Take 1 tablet (3.125 mg total) by mouth 2 (two) times daily with a meal. 60 tablet 0   lisinopril -hydrochlorothiazide  (ZESTORETIC ) 20-12.5 MG tablet Take 1 tablet by mouth daily. 90 tablet 3   No facility-administered medications prior to visit.   No Known Allergies  Objective:   Today's Vitals   02/17/24 1515  BP: 136/78  Pulse: 76  Temp: (!) 97.3 F (36.3 C)  TempSrc: Temporal  SpO2: 97%  Weight: 220 lb 6.4 oz (100 kg)  Height: 5\' 5"  (1.651 m)   Body mass index is 36.68 kg/m.   General: Well developed, well nourished. No acute distress. Psych: Alert and oriented. Normal mood and affect.  Health Maintenance Due  Topic Date Due   HIV Screening  Never done   Cervical Cancer Screening (HPV/Pap Cotest)  09/20/2022   Lab Results    Latest Ref Rng & Units 02/07/2024    6:03 AM 09/06/2022    8:30 AM 06/30/2022    5:05 PM  CBC  WBC 4.0 - 10.5 K/uL 10.2  7.9  8.9   Hemoglobin 12.0 - 15.0 g/dL 57.8  46.9  62.9   Hematocrit 36.0 - 46.0 % 40.7  40.0  40.3   Platelets 150 - 400 K/uL 315  275   332       Latest Ref Rng & Units 02/07/2024    6:03 AM 10/24/2023   11:19 AM 07/04/2023    3:01 PM  CMP  Glucose 70 - 99 mg/dL 528  90  96   BUN 6 - 20 mg/dL 15  12  11    Creatinine 0.44 - 1.00 mg/dL 4.13  2.44  0.10   Sodium 135 - 145 mmol/L 135  141  139   Potassium 3.5 - 5.1 mmol/L 3.7  4.0  4.5   Chloride 98 - 111 mmol/L 100  103  102   CO2 22 - 32 mmol/L 27  28  26    Calcium 8.9 - 10.3 mg/dL 9.4  9.5  9.9   Total Protein 6.5 - 8.1 g/dL 7.9     Total Bilirubin 0.0 - 1.2 mg/dL 0.8     Alkaline Phos 38 - 126 U/L 100     AST 15 - 41 U/L 19     ALT 0 - 44 U/L 21        Assessment & Plan:   Problem List Items Addressed This Visit       Cardiovascular and Mediastinum   Essential hypertension (Chronic)   BP is improved today, though still slightly above goal. Continue lisinopril -HCTZ (Zestoretic ) 20-12.5 mg daily and carvedilol  3.125 mg bid. Encourage efforts at exercise and weight loss.      Relevant Medications   lisinopril -hydrochlorothiazide  (ZESTORETIC ) 20-12.5 MG tablet   carvedilol  (COREG ) 3.125 MG tablet     Other   Prediabetes - Primary (Chronic)   Lab testing has shown mixed results. I will repeat her A1c and blood glucose today, as we continue to monitor this. If this does confirm diabetes, I will refer her to diabetes education and consider need for medication.      Relevant Orders   Glucose, random   Hemoglobin A1c    Return in about 3 months (around 05/19/2024) for Reassessment.   Graig Lawyer, MD

## 2024-02-17 NOTE — Assessment & Plan Note (Signed)
 Lab testing has shown mixed results. I will repeat her A1c and blood glucose today, as we continue to monitor this. If this does confirm diabetes, I will refer her to diabetes education and consider need for medication.

## 2024-02-18 ENCOUNTER — Ambulatory Visit: Payer: Self-pay | Admitting: Family Medicine

## 2024-02-18 LAB — HEMOGLOBIN A1C: Hgb A1c MFr Bld: 6.4 % (ref 4.6–6.5)

## 2024-02-18 LAB — GLUCOSE, RANDOM: Glucose, Bld: 110 mg/dL — ABNORMAL HIGH (ref 70–99)

## 2024-02-19 ENCOUNTER — Telehealth: Payer: Self-pay | Admitting: Family Medicine

## 2024-02-19 NOTE — Telephone Encounter (Signed)
 08/12/2023 no show 10/16/2023 same day cancellation 01/26/2024 no show 02/13/2024 same day cancellation  Pt was sent a final warning letter in Feb 2025. Pt was seen 02/17/2024. Sent another final warning letter via mail and mychart.

## 2024-03-03 ENCOUNTER — Ambulatory Visit: Admitting: Family Medicine

## 2024-05-10 ENCOUNTER — Other Ambulatory Visit: Payer: Self-pay | Admitting: Obstetrics and Gynecology

## 2024-05-10 DIAGNOSIS — R928 Other abnormal and inconclusive findings on diagnostic imaging of breast: Secondary | ICD-10-CM

## 2024-05-13 ENCOUNTER — Inpatient Hospital Stay
Admission: RE | Admit: 2024-05-13 | Discharge: 2024-05-13 | Discharge status: Home / Self Care | Source: Ambulatory Visit | Attending: Obstetrics and Gynecology

## 2024-05-13 DIAGNOSIS — R928 Other abnormal and inconclusive findings on diagnostic imaging of breast: Secondary | ICD-10-CM

## 2024-05-18 ENCOUNTER — Ambulatory Visit: Payer: Self-pay

## 2024-05-18 NOTE — Telephone Encounter (Signed)
 FYI Only or Action Required?: FYI only for provider.  Patient was last seen in primary care on 02/17/2024 by Thedora Garnette HERO, MD.  Called Nurse Triage reporting Back Pain.  Symptoms began several weeks ago.  Interventions attempted: OTC medications: Tylenol .  Symptoms are: gradually worsening.  Triage Disposition: See PCP Within 2 Weeks  Patient/caregiver understands and will follow disposition?: Yes      Copied from CRM [id number]. Topic: Clinical - Red Word Triage >> May 18, 2024 12:54 PM Robinson H wrote: [facility] that prompted transfer to Nurse Triage: Lower back pain and stiffness in buttocks area        Reason for Disposition  Back pain present > 2 weeks  Answer Assessment - Initial Assessment Questions 1. ONSET: When did the pain begin? (e.g., minutes, hours, days)     2-3 weeks  2. LOCATION: Where does it hurt? (upper, mid or lower back)     Lower back  3. SEVERITY: How bad is the pain?  (e.g., Scale 1-10; mild, moderate, or severe)     8/10 at it's worse  4. PATTERN: Is the pain constant? (e.g., yes, no; constant, intermittent)      Intermittent  5. RADIATION: Does the pain shoot into your legs or somewhere else?     Buttocks  6. CAUSE:  What do you think is causing the back pain?      Unsure 7. BACK OVERUSE:  Any recent lifting of heavy objects, strenuous work or exercise?     No 8. MEDICINES: What have you taken so far for the pain? (e.g., nothing, acetaminophen , NSAIDS)     Tylenol   9. NEUROLOGIC SYMPTOMS: Do you have any weakness, numbness, or problems with bowel/bladder control?      No 10. OTHER SYMPTOMS: Do you have any other symptoms? (e.g., fever, abdomen pain, burning with urination, blood in urine)       Mild diarrhea  Protocols used: Back Pain-A-AH

## 2024-05-19 ENCOUNTER — Ambulatory Visit: Admitting: Family Medicine

## 2024-05-21 ENCOUNTER — Ambulatory Visit: Admitting: Family Medicine

## 2024-05-21 ENCOUNTER — Telehealth: Payer: Self-pay | Admitting: Family Medicine

## 2024-05-21 NOTE — Telephone Encounter (Signed)
 08/12/2023 no show 10/16/2023 same day cancellation 01/26/2024 no show 02/13/2024 same day cancellation 05/21/2024 same day cancellation   Pt was sent a final warning letter in Feb 2025.  Another final warning letter sent 02/19/2024     Pt is scheduled to see Corean Geralds, PA on 9/8 for ACUTE issue. Do you want to proceed with dismissal?

## 2024-05-24 ENCOUNTER — Ambulatory Visit: Admitting: Emergency Medicine

## 2024-05-24 NOTE — Telephone Encounter (Signed)
 Dismissal generated - pt appt 9/8 was moved to 9/9 due to Marie Tate, [place] being out today.

## 2024-05-25 ENCOUNTER — Ambulatory Visit: Payer: Self-pay

## 2024-05-25 ENCOUNTER — Ambulatory Visit: Admitting: Family Medicine

## 2024-05-25 NOTE — Telephone Encounter (Signed)
 Noted thanks

## 2024-05-25 NOTE — Telephone Encounter (Signed)
 FYI Only or Action Required?: FYI only for provider.  Patient was last seen in primary care on 02/17/2024 by Marie Garnette HERO, MD.  Called Nurse Triage reporting Back Pain.  Symptoms began several weeks ago.  Interventions attempted: Tylenol   Symptoms are: unchanged.  Triage Disposition: See PCP Within 2 Weeks  Patient/caregiver understands and will follow disposition?: Yes     Copied from CRM [id number]. Topic: Clinical - Red Word Triage >> May 25, 2024 10:58 AM Antwanette L wrote: Red Word that prompted transfer to Nurse Triage: Pt is pre diabetic and is experiencing back/shoulder pain and stiffness in her right buttock area. Reason for Disposition  Back pain present > 2 weeks  Answer Assessment - Initial Assessment Questions 1. ONSET: When did the pain begin? (e.g., minutes, hours, days)     2 weeks 2. LOCATION: Where does it hurt? (upper, mid or lower back)     Upper back, shoulder blade 3. SEVERITY: How bad is the pain?  (e.g., Scale 1-10; mild, moderate, or severe)     mild 4. PATTERN: Is the pain constant? (e.g., yes, no; constant, intermittent)      Comes and goes 5. RADIATION: Does the pain shoot into your legs or somewhere else?     denies 6. CAUSE:  What do you think is causing the back pain?      unknown 7. BACK OVERUSE:  Any recent lifting of heavy objects, strenuous work or exercise?     denies 8. MEDICINES: What have you taken so far for the pain? (e.g., nothing, acetaminophen , NSAIDS)     tylenol  9. NEUROLOGIC SYMPTOMS: Do you have any weakness, numbness, or problems with bowel/bladder control?     denies 10. OTHER SYMPTOMS: Do you have any other symptoms? (e.g., fever, abdomen pain, burning with urination, blood in urine)       no 11. PREGNANCY: Is there any chance you are pregnant? When was your last menstrual period?       na  Protocols used: Back Pain-A-AH

## 2024-05-26 ENCOUNTER — Encounter: Payer: Self-pay | Admitting: Family

## 2024-05-26 NOTE — Progress Notes (Signed)
 Erroneous encounter-disregard

## 2024-07-06 DIAGNOSIS — H25812 Combined forms of age-related cataract, left eye: Secondary | ICD-10-CM | POA: Diagnosis not present

## 2024-07-06 DIAGNOSIS — Z01818 Encounter for other preprocedural examination: Secondary | ICD-10-CM | POA: Diagnosis not present

## 2024-07-15 ENCOUNTER — Ambulatory Visit: Payer: Self-pay | Admitting: *Deleted

## 2024-07-15 NOTE — Telephone Encounter (Signed)
 Recommended ED now and to request refill of coreg  3.125 mg until established with new provider 08/03/24.    FYI Only or Action Required?: FYI only for provider: ED advised.  Patient was last seen in primary care on 02/17/2024 by Thedora Garnette HERO, MD.  Called Nurse Triage reporting Pain.  Symptoms began yesterday.  Interventions attempted: Nothing.  Symptoms are: gradually worsening.  Triage Disposition: Go to ED Now (or PCP Triage)  Patient/caregiver understands and will follow disposition?: Unsure      Copied from CRM #[id number]. Topic: Clinical - Red Word Triage >> Jul 15, 2024 11:50 AM Wess RAMAN wrote: Red Word that prompted transfer to Nurse Triage: Cramps in foot and legs and left arm and needs refill of carvedilol  (COREG ) 3.125 MG tablet. Difficulty sleeping.  Has new patient appt on 11/18 at [facility] Reason for Disposition  Patient sounds very sick or weak to the triager  Answer Assessment - Initial Assessment Questions Recommended ED now left arm tingling, pain and swelling left leg behind knee, calf and thigh. Patient in between dr. And has new patient appt at [facility] 08/03/24. Needs refill for coreg  3.125 mg. Patient reports she was released from [facility] for cancelling too many appt.  Recommended if sx worsen call 911.        1. SYMPTOM: What is the main symptom you are concerned about? (e.g., weakness, numbness)     Slight dull headache went away , left arm feels pain and tingling. Left leg pain swelling behind knee.  2. ONSET: When did this start? (e.g., minutes, hours, days; while sleeping)     Left arm yesterday and again this am . Sensation comes and goes.  3. LAST NORMAL: When was the last time you (the patient) were normal (no symptoms)?     Yesterday  4. PATTERN Does this come and go, or has it been constant since it started?  Is it present now?     Comes and goes. Present now  5. CARDIAC SYMPTOMS:  Have you had any of the following symptoms: chest pain, difficulty breathing, palpitations?     Na  6. NEUROLOGIC SYMPTOMS: Have you had any of the following symptoms: headache, dizziness, vision loss, double vision, changes in speech, unsteady on your feet?     Headache mild comes and goes, always has blurred vision , needs cataract surgery. Tingling left arm comes and goes. Pain left leg and swelling behind knee , calf and thigh. Difficulty sleeping .  7. OTHER SYMPTOMS: Do you have any other symptoms?     See above.  8. PREGNANCY: Is there any chance you are pregnant? When was your last menstrual period?     na  Protocols used: Neurologic Deficit-A-AH

## 2024-07-20 ENCOUNTER — Encounter: Payer: Self-pay | Admitting: Cardiology

## 2024-07-20 ENCOUNTER — Ambulatory Visit: Attending: Family | Admitting: Cardiology

## 2024-07-20 VITALS — BP 138/66 | HR 85 | Ht 65.0 in | Wt 230.0 lb

## 2024-07-20 DIAGNOSIS — I10 Essential (primary) hypertension: Secondary | ICD-10-CM | POA: Diagnosis not present

## 2024-07-20 DIAGNOSIS — R011 Cardiac murmur, unspecified: Secondary | ICD-10-CM

## 2024-07-20 DIAGNOSIS — E785 Hyperlipidemia, unspecified: Secondary | ICD-10-CM

## 2024-07-20 DIAGNOSIS — Z8249 Family history of ischemic heart disease and other diseases of the circulatory system: Secondary | ICD-10-CM

## 2024-07-20 DIAGNOSIS — I471 Supraventricular tachycardia, unspecified: Secondary | ICD-10-CM | POA: Diagnosis not present

## 2024-07-20 MED ORDER — CARVEDILOL 6.25 MG PO TABS: 6.2500 mg | ORAL_TABLET | Freq: Two times a day (BID) | ORAL | 3 refills | Status: AC

## 2024-07-20 NOTE — Patient Instructions (Signed)
 Medication Instructions:    With the current prescription Carvedilol  3.125 mg -- for 3 days take  1 tablet of 3.125 mg in the morning and 2 tablets of 3.125 mg in the evening, then increase 2 tablets  of 3.125 mg twice ay  until bottle is empty  then follow the instruction below--  Changing dosage Carvedilol  6.25 mg twice a day   *If you need a refill on your cardiac medications before your next appointment, please call your pharmacy*   Lab Work: Not needed If you have labs (blood work) drawn today and your tests are completely normal, you will receive your results only by: MyChart Message (if you have MyChart) OR A paper copy in the mail If you have any lab test that is abnormal or we need to change your treatment, we will call you to review the results.   Testing/Procedures:  Your physician has requested that you have an echocardiogram. Echocardiography is a painless test that uses sound waves to create images of your heart. It provides your doctor with information about the size and shape of your heart and how well your heart's chambers and valves are working. This procedure takes approximately one hour. There are no restrictions for this procedure. Please do NOT wear cologne, perfume, aftershave, or lotions (deodorant is allowed). Please arrive 15 minutes prior to your appointment time.  Please note: We ask at that you not bring children with you during ultrasound (echo/ vascular) testing. Due to room size and safety concerns, children are not allowed in the ultrasound rooms during exams. Our front office staff cannot provide observation of children in our lobby area while testing is being conducted. An adult accompanying a patient to their appointment will only be allowed in the ultrasound room at the discretion of the ultrasound technician under special circumstances. We apologize for any inconvenience.   Follow-Up: At [facility], you and your health needs are our priority.  As  part of our continuing mission to provide you with exceptional heart care, we have created designated Provider Care Teams.  These Care Teams include your primary Cardiologist (physician) and Advanced Practice Providers (APPs -  Physician Assistants and Nurse Practitioners) who all work together to provide you with the care you need, when you need it.     Your next appointment:   4 month(s)  The format for your next appointment:   In Person  Provider:   Alm Clay, MD or Damien Braver, NP or Katlyn West, NP

## 2024-07-20 NOTE — Progress Notes (Signed)
 Cardiology Office Note:  .   Date:  07/24/2024  ID:  Marie Tate, DOB [birth year], MRN [id number] PCP: Jaycee Greig PARAS, NP   HeartCare Providers Cardiologist:  Alm Clay, MD     Chief Complaint  Patient presents with   Follow-up   Hypertension    Blood pressure doing better, but still has room.  Still has some palpitations but no real exertional dyspnea or chest pain    Patient Profile: .     Marie Tate is a moderate to severely obese 63 y.o. female  with a PMH notable for HTN, pre-DM and borderline HLD who presents here for 46-month follow-up at the request of No ref. provider found.  Marie Tate was seen for initial evaluation EVALUATION of SHORTNESS OF BREATH in November 2024 at the request of Thedora Garnette HERO, MD.       Alyssamarie Mounsey was last seen on Feb 06, 2024 blood pressure is borderline.  I suggested that if her pressures remain elevated and her PCP started her on carvedilol  and she was started on 3.25 mg twice daily along with her existing lisinopril -HCTZ 20-12.5 mg daily.  Subjective  Discussed the use of AI scribe software for clinical note transcription with the patient, who gave verbal consent to proceed.  History of Present Illness Marie Tate is a 63 year old female with prediabetes and hypertension who presents with shortness of breath and palpitations at night.  She experiences shortness of breath and a sensation of anxiety primarily when lying down at night. These episodes are not nightly but have occurred recently, including last night and two nights prior. She describes the sensation as her heart racing, which she associates with irregular heartbeats. These symptoms do not occur during physical activity, such as walking or using the treadmill, where she feels fine.  She denies any exertional chest pain or pressure.  She really denies significant exertional dyspnea unless she is overdoing it.  No PND, or orthopnea with  occasional swelling that does not sound like true edema..  She has been on carvedilol , initially started at a low dose, to manage her symptoms. She recalls wearing a heart monitor for almost 14 days, which showed normal sinus rhythm with occasional premature beats and short bursts of atrial tachycardia. The longest episode recorded by the monitor lasted 16 beats at a rate of 112 beats per minute.  She does feel these short fluttering sensations, but no prolonged episodes.  Nothing to suggest any arrhythmia.  No syncope/near syncope or TIA/amaurosis fugax.    She has a history of high blood pressure, for which she was hospitalized shortly after a previous visit. She has been working on [employer], having previously lost 32 pounds with the help of Ozempic  for her prediabetes, although she has since regained some weight. Her A1c was 6.4 this summer.  She experienced cramping in her left foot, which progressed to her calf and thigh, lasting for a few days before resolving. There was slight swelling behind her knee, which has since subsided. No claudication.     Objective   Current meds include carvedilol  3.125 mg twice daily, lisinopril -HCTZ 20-12.5 mg daily, magnesium current 50 mg daily, vitamin D and vitamin E capsules.  Studies Reviewed: SABRA       Labs: Lipids (12/03/2022) TC 198, TG 84, HDL 60, LDL 118; 02/17/2024 J8r 6.4; 02/07/2024 Hgb 13.1, Cr 0.75, K+ 3.7 Results DIAGNOSTIC Cardiac Monitor: Normal sinus rhythm with heart rate range 49-148  bpm, rare premature atrial and ventricular beats, 28 episodes of atrial tachycardia with rates 85-214 bpm (06/2024) 14-day Zio patch monitor November 2024:    Predominant rhythm is Sinus Rhythm: Rate range 49 to 148 bpm and average of 77 bpm.    Rare PACs and PVCs noted    28 Atrial Runs: Fastest run-6 beats (2 seconds), rate range 128-214 bpm with an average of 178 bpm; longest was 16 (8.2 seconds) beats with a rate range of 97-133 bpm and an  average rate of 112 bpm    Symptoms noted with sinus rhythm, sinus tachycardia, sinus rhythm with isolated PACs as well as PAC couplets, triplets and at least 7-8 atrial runs were noted as well.    Overall relatively reassuring findings.  Short atrial runs are benign and not likely cause to be adverse symptoms although they may be symptomatic.   Risk Assessment/Calculations:          Physical Exam:   VS:  BP 138/66   Pulse 85   Ht 5' 5 (1.651 m)   Wt 230 lb (104.3 kg)   LMP 06/03/2016 (Approximate)   SpO2 97%   BMI 38.27 kg/m    Wt Readings from Last 3 Encounters:  07/20/24 230 lb (104.3 kg)  02/17/24 220 lb 6.4 oz (100 kg)  02/07/24 220 lb (99.8 kg)      GEN: Otherwise healthy appearing; Well nourished, well groomed; in no acute distress; MODERATE-SEVERELY OBESE NECK: No JVD; No carotid bruits CARDIAC: Normal S1, S2; RRR, no rubs, gallops; CRO soft SEM RESPIRATORY:  Clear to auscultation without rales, wheezing or rhonchi ; nonlabored, good air movement. ABDOMEN: Soft, non-tender, non-distended EXTREMITIES:  No edema; No deformity      ASSESSMENT AND PLAN: .    Problem List Items Addressed This Visit       Cardiology Problems   Essential hypertension (Chronic)   Blood pressure at slightly elevated to upper limit of desired range.  Currently on carvedilol  3.25 mg twice daily as well as lisinopril  and HCTZ 20-12.5 mg daily. - Increased carvedilol  dose as outlined for atrial tachycardia to manage hypertension. => Increase in stepwise fashion from 3.125 mg twice daily to 6.25 mg twice daily (increasing p.m. dose to 2 tabs for 1 week and then increasing to 2 tabs twice daily) -> if able to tolerate 2 tabs twice daily she will start to 6.25 mg tablets twice daily  We would have room to continue to titrate carvedilol  as long as heart rate stays stable. - Continue lisinopril -HCTZ at current dose.      Relevant Medications   carvedilol  (COREG ) 6.25 MG tablet    Hyperlipidemia with target LDL less than 100 (Chronic)   Waiting for dad to recheck by PCP.  Pending the labs may want to discuss initiation of therapy.  If necessary could consider coronary Scoring for risk stratification in order to determine whether she would be interested in actually initiate treatment versus lifestyle modification.      Relevant Medications   carvedilol  (COREG ) 6.25 MG tablet   PSVT (paroxysmal supraventricular tachycardia) - Primary (Chronic)   Really short little spells.  No longer having any prolonged episodes.  She had 28 benign atrial runs, but no prolonged SVT.  - Titrate carvedilol  up to 6.25 mg twice daily as indicated.      Relevant Medications   carvedilol  (COREG ) 6.25 MG tablet     Other   Ejection murmur   I am not sure if I heard  a systolic ejection murmur last time I saw her.  Did not mention in my note.  With hypertension need to exclude septal hypertrophy or simply hypertensive ectopy.  Plan: Check 2D echo..      Relevant Orders   ECHOCARDIOGRAM COMPLETE   Family history of early CAD (Chronic)   We the importance of lipid management and glycemic control as well as blood pressure control.  She has borderline diabetes with A1c listed as 6.4 and has LDL of 118.  We can consider Coronary Calcium Score for risk stratification => can discuss in follow-up.               Follow-Up: Return in about 4 months (around 11/17/2024) for [facility], Follow-up with APP or MD 1 minute.  I spent 47 minutes in the care of [facility] today including reviewing labs (1 minute), reviewing studies (Zio patch reporting reviewed-2 minutes), face to face time discussing treatment options (30 minutes), reviewing records from previous notes and ER visit (5 minutes), 9 minutes dictating, and documenting in the encounter.     Signed, Alm MICAEL Clay, MD, MS Alm Clay, M.D., M.S. Interventional Cardiologist  [facility] Pager # [phone]

## 2024-07-23 ENCOUNTER — Ambulatory Visit: Admitting: Cardiology

## 2024-07-24 ENCOUNTER — Encounter: Payer: Self-pay | Admitting: Cardiology

## 2024-07-24 NOTE — Assessment & Plan Note (Signed)
 We the importance of lipid management and glycemic control as well as blood pressure control.  She has borderline diabetes with A1c listed as 6.4 and has LDL of 118.  We can consider Coronary Calcium Score for risk stratification => can discuss in follow-up.

## 2024-07-24 NOTE — Assessment & Plan Note (Signed)
 Really short little spells.  No longer having any prolonged episodes.  She had 28 benign atrial runs, but no prolonged SVT.  - Titrate carvedilol  up to 6.25 mg twice daily as indicated.

## 2024-07-24 NOTE — Assessment & Plan Note (Signed)
 Waiting for dad to recheck by PCP.  Pending the labs may want to discuss initiation of therapy.  If necessary could consider coronary Scoring for risk stratification in order to determine whether she would be interested in actually initiate treatment versus lifestyle modification.

## 2024-07-24 NOTE — Assessment & Plan Note (Signed)
 I am not sure if I heard a systolic ejection murmur last time I saw her.  Did not mention in my note.  With hypertension need to exclude septal hypertrophy or simply hypertensive ectopy.  Plan: Check 2D echo.SABRA

## 2024-07-24 NOTE — Assessment & Plan Note (Signed)
 Blood pressure at slightly elevated to upper limit of desired range.  Currently on carvedilol  3.25 mg twice daily as well as lisinopril  and HCTZ 20-12.5 mg daily. - Increased carvedilol  dose as outlined for atrial tachycardia to manage hypertension. => Increase in stepwise fashion from 3.125 mg twice daily to 6.25 mg twice daily (increasing p.m. dose to 2 tabs for 1 week and then increasing to 2 tabs twice daily) -> if able to tolerate 2 tabs twice daily she will start to 6.25 mg tablets twice daily  We would have room to continue to titrate carvedilol  as long as heart rate stays stable. - Continue lisinopril -HCTZ at current dose.

## 2024-07-28 ENCOUNTER — Ambulatory Visit: Admitting: Family

## 2024-08-03 ENCOUNTER — Ambulatory Visit: Payer: Self-pay | Admitting: Family

## 2024-08-03 ENCOUNTER — Other Ambulatory Visit (HOSPITAL_COMMUNITY)
Admission: RE | Admit: 2024-08-03 | Discharge: 2024-08-03 | Disposition: A | Discharge status: Home / Self Care | Source: Ambulatory Visit | Attending: Family | Admitting: Family

## 2024-08-03 ENCOUNTER — Ambulatory Visit (INDEPENDENT_AMBULATORY_CARE_PROVIDER_SITE_OTHER): Admitting: Family

## 2024-08-03 ENCOUNTER — Encounter: Payer: Self-pay | Admitting: Family

## 2024-08-03 VITALS — BP 127/77 | HR 69 | Temp 98.2°F | Resp 16 | Ht 65.0 in | Wt 228.6 lb

## 2024-08-03 DIAGNOSIS — Z7689 Persons encountering health services in other specified circumstances: Secondary | ICD-10-CM

## 2024-08-03 DIAGNOSIS — Z23 Encounter for immunization: Secondary | ICD-10-CM

## 2024-08-03 DIAGNOSIS — R399 Unspecified symptoms and signs involving the genitourinary system: Secondary | ICD-10-CM

## 2024-08-03 DIAGNOSIS — G2581 Restless legs syndrome: Secondary | ICD-10-CM

## 2024-08-03 LAB — POCT URINALYSIS DIP (CLINITEK)
Bilirubin, UA: NEGATIVE
Blood, UA: NEGATIVE
Glucose, UA: NEGATIVE mg/dL
Ketones, POC UA: NEGATIVE mg/dL
Nitrite, UA: NEGATIVE
POC PROTEIN,UA: NEGATIVE
Spec Grav, UA: 1.025 (ref 1.010–1.025)
Urobilinogen, UA: 0.2 U/dL
pH, UA: 6.5 (ref 5.0–8.0)

## 2024-08-03 MED ORDER — ROPINIROLE HCL 0.25 MG PO TABS: 0.2500 mg | ORAL_TABLET | Freq: Every evening | ORAL | 0 refills | Status: AC | PRN

## 2024-08-03 NOTE — Progress Notes (Signed)
 Subjective:    Marie Tate - 63 y.o. female MRN [id number]  Date of birth: Feb 13, 1961  HPI  Marie Tate is to establish care.   Current issues and/or concerns: - Restless legs causing leg cramps during nighttime. Denies red flag symptoms.  - States feels kidney pain. Denies red flag symptoms. States she consumes plenty of water.   ROS per HPI    Health Maintenance:   Health Maintenance Due  Topic Date Due   HIV Screening  Never done   Cervical Cancer Screening (HPV/Pap Cotest)  09/20/2022   Mammogram  03/18/2024   Influenza Vaccine  04/16/2024     Past Medical History: Patient Active Problem List   Diagnosis Date Noted   Ejection murmur 07/20/2024   PSVT (paroxysmal supraventricular tachycardia) 10/24/2023   Hyperlipidemia with target LDL less than 100 07/22/2023   Family history of early CAD 07/22/2023   Dyspnea, unspecified 07/21/2023   Urticaria 07/15/2023   Essential hypertension 12/03/2022   Hepatic steatosis 12/03/2022   Liver lesion, right lobe 12/03/2022   Class 2 obesity due to excess calories with body mass index (BMI) of 36.0 to 36.9 in adult 12/03/2022   Prediabetes 12/03/2022   Unilateral primary osteoarthritis, right knee 04/02/2017   Bacterial vaginosis 07/09/2016   Epigastric pain 07/09/2016   Dysuria 07/09/2016   Hiatal hernia 07/09/2016   GERD (gastroesophageal reflux disease) 07/09/2016      Social History   reports that she quit smoking about 15 years ago. Her smoking use included cigarettes. She has never used smokeless tobacco. She reports current alcohol use. She reports that she does not use drugs.   Family History  family history includes Cancer in her sister; Diabetes in her brother; Heart attack in her brother and mother; Hypertension in her father and mother; Kidney disease in her sister; Seizures in her brother; Stomach cancer in her maternal aunt; Stroke in her father; Tuberculosis in an other family member.    Medications: reviewed and updated   Objective:   Physical Exam BP 127/77   Pulse 69   Temp 98.2 F (36.8 C) (Oral)   Resp 16   Ht 5' 5 (1.651 m)   Wt 228 lb 9.6 oz (103.7 kg)   LMP 06/03/2016 (Approximate)   SpO2 94%   BMI 38.04 kg/m   Physical Exam HENT:     Head: Normocephalic and atraumatic.     Nose: Nose normal.     Mouth/Throat:     Mouth: Mucous membranes are moist.     Pharynx: Oropharynx is clear.  Eyes:     Extraocular Movements: Extraocular movements intact.     Conjunctiva/sclera: Conjunctivae normal.     Pupils: Pupils are equal, round, and reactive to light.  Cardiovascular:     Rate and Rhythm: Normal rate and regular rhythm.     Pulses: Normal pulses.     Heart sounds: Normal heart sounds.  Pulmonary:     Effort: Pulmonary effort is normal.     Breath sounds: Normal breath sounds.  Musculoskeletal:        General: Normal range of motion.     Right shoulder: Normal.     Left shoulder: Normal.     Right upper arm: Normal.     Left upper arm: Normal.     Right elbow: Normal.     Left elbow: Normal.     Right forearm: Normal.     Left forearm: Normal.     Right wrist: Normal.  Left wrist: Normal.     Right hand: Normal.     Left hand: Normal.     Cervical back: Normal, normal range of motion and neck supple.     Thoracic back: Normal.     Lumbar back: Normal.     Right hip: Normal.     Left hip: Normal.     Right upper leg: Normal.     Left upper leg: Normal.     Right knee: Normal.     Left knee: Normal.     Right lower leg: Normal.     Left lower leg: Normal.     Right ankle: Normal.     Left ankle: Normal.     Right foot: Normal.     Left foot: Normal.  Neurological:     General: No focal deficit present.     Mental Status: She is alert and oriented to person, place, and time.  Psychiatric:        Mood and Affect: Mood normal.        Behavior: Behavior normal.     Assessment & Plan:  1. Encounter to establish care  (Primary) - Patient presents today to establish care. During the interim follow-up with primary provider as scheduled.  - Return for annual physical examination, labs, and health maintenance. Arrive fasting meaning having no food for at least 8 hours prior to appointment. You may have only water or black coffee. Please take scheduled medications as normal.  2. Restless legs - Ropinirole as prescribed. Counseled on medication adherence/adverse effects.  - Routine screening.  - Follow-up with primary provider as scheduled.  - rOPINIRole (REQUIP) 0.25 MG tablet; Take 1 tablet (0.25 mg total) by mouth at bedtime as needed.  Dispense: 90 tablet; Refill: 0 - Magnesium  3. Lower urinary tract symptoms (LUTS) - Routine screening.  - POCT URINALYSIS DIP (CLINITEK); Future - Urine Culture - Basic Metabolic Panel - Cervicovaginal ancillary only   Patient was given clear instructions to go to Emergency Department or return to medical center if symptoms don't improve, worsen, or new problems develop.The patient verbalized understanding.  I discussed the assessment and treatment plan with the patient. The patient was provided an opportunity to ask questions and all were answered. The patient agreed with the plan and demonstrated an understanding of the instructions.   The patient was advised to call back or seek an in-person evaluation if the symptoms worsen or if the condition fails to improve as anticipated.    Greig Drones, NP 08/03/2024, 3:49 PM Primary Care at [facility]

## 2024-08-03 NOTE — Progress Notes (Signed)
 Cramping in left leg,  pain in back where kidneys are located

## 2024-08-03 NOTE — Addendum Note (Signed)
 Addended by: FRANCHOT PURVIS CROME on: 08/03/2024 04:25 PM   Modules accepted: Orders

## 2024-08-04 LAB — CERVICOVAGINAL ANCILLARY ONLY
Bacterial Vaginitis (gardnerella): NEGATIVE
Candida Glabrata: NEGATIVE
Candida Vaginitis: NEGATIVE
Chlamydia: NEGATIVE
Comment: NEGATIVE
Comment: NEGATIVE
Comment: NEGATIVE
Comment: NEGATIVE
Comment: NEGATIVE
Comment: NORMAL
Neisseria Gonorrhea: NEGATIVE
Trichomonas: NEGATIVE

## 2024-08-04 LAB — MAGNESIUM: Magnesium: 2.4 mg/dL — ABNORMAL HIGH (ref 1.6–2.3)

## 2024-08-04 LAB — BASIC METABOLIC PANEL WITH GFR
BUN/Creatinine Ratio: 23 (ref 12–28)
BUN: 20 mg/dL (ref 8–27)
CO2: 23 mmol/L (ref 20–29)
Calcium: 10 mg/dL (ref 8.7–10.3)
Chloride: 100 mmol/L (ref 96–106)
Creatinine, Ser: 0.87 mg/dL (ref 0.57–1.00)
Glucose: 98 mg/dL (ref 70–99)
Potassium: 4.3 mmol/L (ref 3.5–5.2)
Sodium: 141 mmol/L (ref 134–144)
eGFR: 76 mL/min/1.73 (ref >59)

## 2024-08-05 LAB — URINE CULTURE

## 2024-08-24 ENCOUNTER — Ambulatory Visit (HOSPITAL_COMMUNITY): Admission: RE | Admit: 2024-08-24 | Discharge: 2024-08-24 | Attending: Cardiology | Admitting: Cardiology

## 2024-08-24 DIAGNOSIS — R011 Cardiac murmur, unspecified: Secondary | ICD-10-CM | POA: Diagnosis not present

## 2024-08-24 LAB — ECHOCARDIOGRAM COMPLETE
Area-P 1/2: 3.74 cm2
S' Lateral: 2.68 cm

## 2024-08-26 ENCOUNTER — Ambulatory Visit: Payer: Self-pay | Admitting: Cardiology

## 2024-09-23 ENCOUNTER — Encounter: Admitting: Family

## 2024-09-23 NOTE — Progress Notes (Signed)
 Erroneous encounter-disregard

## 2024-11-18 ENCOUNTER — Ambulatory Visit: Admitting: Nurse Practitioner

## 2025-05-02 ENCOUNTER — Encounter: Admitting: Family
